# Patient Record
Sex: Male | Born: 1965
Health system: Southern US, Community
[De-identification: ages and names within clinical notes are randomized; demographics above are authoritative.]

## PROBLEM LIST (undated history)

## (undated) DIAGNOSIS — I471 Supraventricular tachycardia, unspecified: Secondary | ICD-10-CM

## (undated) DIAGNOSIS — E669 Obesity, unspecified: Secondary | ICD-10-CM

## (undated) DIAGNOSIS — I10 Essential (primary) hypertension: Secondary | ICD-10-CM

## (undated) DIAGNOSIS — E042 Nontoxic multinodular goiter: Secondary | ICD-10-CM

## (undated) DIAGNOSIS — E109 Type 1 diabetes mellitus without complications: Secondary | ICD-10-CM

## (undated) DIAGNOSIS — M722 Plantar fascial fibromatosis: Secondary | ICD-10-CM

## (undated) HISTORY — DX: Plantar fascial fibromatosis: M72.2

## (undated) HISTORY — DX: Type 1 diabetes mellitus without complications: E10.9

## (undated) HISTORY — DX: Supraventricular tachycardia: I47.1

## (undated) HISTORY — DX: Essential (primary) hypertension: I10

## (undated) HISTORY — DX: Supraventricular tachycardia, unspecified: I47.10

## (undated) HISTORY — DX: Obesity, unspecified: E66.9

## (undated) HISTORY — DX: Nontoxic multinodular goiter: E04.2

---

## 1998-05-01 ENCOUNTER — Encounter: Payer: Self-pay | Admitting: Family Medicine

## 1998-05-01 ENCOUNTER — Ambulatory Visit (HOSPITAL_COMMUNITY): Admission: RE | Admit: 1998-05-01 | Discharge: 1998-05-01 | Payer: Self-pay | Admitting: Family Medicine

## 1998-10-11 ENCOUNTER — Ambulatory Visit (HOSPITAL_COMMUNITY): Admission: RE | Admit: 1998-10-11 | Discharge: 1998-10-11 | Payer: Self-pay | Admitting: Family Medicine

## 1998-10-11 ENCOUNTER — Encounter: Payer: Self-pay | Admitting: Family Medicine

## 1998-10-24 ENCOUNTER — Encounter: Payer: Self-pay | Admitting: Family Medicine

## 1998-10-24 ENCOUNTER — Ambulatory Visit (HOSPITAL_COMMUNITY): Admission: RE | Admit: 1998-10-24 | Discharge: 1998-10-24 | Payer: Self-pay | Admitting: Family Medicine

## 1999-03-02 ENCOUNTER — Emergency Department (HOSPITAL_COMMUNITY): Admission: EM | Admit: 1999-03-02 | Discharge: 1999-03-02 | Payer: Self-pay | Admitting: Emergency Medicine

## 1999-09-11 ENCOUNTER — Other Ambulatory Visit: Admission: RE | Admit: 1999-09-11 | Discharge: 1999-09-11 | Payer: Self-pay | Admitting: Urology

## 2002-01-11 ENCOUNTER — Ambulatory Visit (HOSPITAL_COMMUNITY): Admission: RE | Admit: 2002-01-11 | Discharge: 2002-01-11 | Payer: Self-pay | Admitting: Family Medicine

## 2002-01-11 ENCOUNTER — Encounter: Payer: Self-pay | Admitting: Family Medicine

## 2002-08-30 ENCOUNTER — Encounter: Payer: Self-pay | Admitting: Family Medicine

## 2002-08-30 ENCOUNTER — Ambulatory Visit (HOSPITAL_COMMUNITY): Admission: RE | Admit: 2002-08-30 | Discharge: 2002-08-30 | Payer: Self-pay | Admitting: Family Medicine

## 2002-12-26 ENCOUNTER — Ambulatory Visit (HOSPITAL_COMMUNITY): Admission: RE | Admit: 2002-12-26 | Discharge: 2002-12-26 | Payer: Self-pay | Admitting: Family Medicine

## 2002-12-26 ENCOUNTER — Encounter: Payer: Self-pay | Admitting: Family Medicine

## 2010-04-02 ENCOUNTER — Encounter: Admission: RE | Admit: 2010-04-02 | Discharge: 2010-04-02 | Payer: Self-pay | Admitting: Internal Medicine

## 2011-12-04 IMAGING — US US SOFT TISSUE HEAD/NECK
1 series · 14 of 25 positions shown · non-contrast
Comparison: None.

CLINICAL DATA: Thyroid nodule on physical exam

THYROID ULTRASOUND
TECHNIQUE: Ultrasound examination of the thyroid gland and adjacent
soft tissues was performed.

[Series 1: us soft tissue head/neck · 0.10mm/px · 14 of 59 slices shown]
[im 1/59]
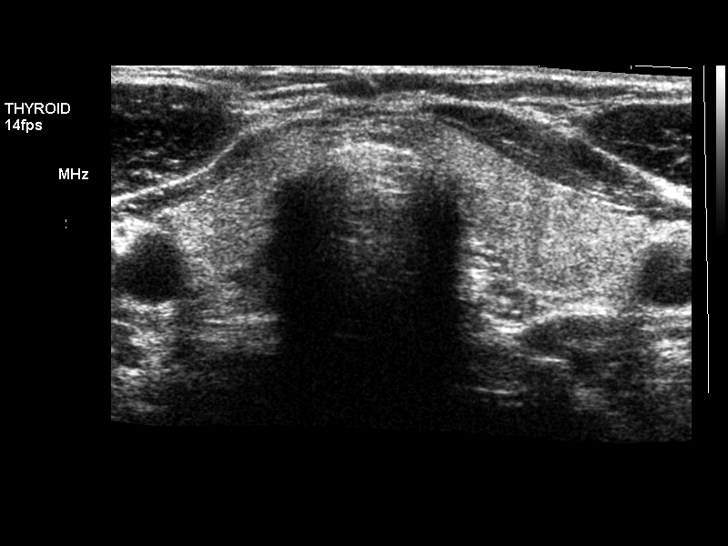
[im 5/59]
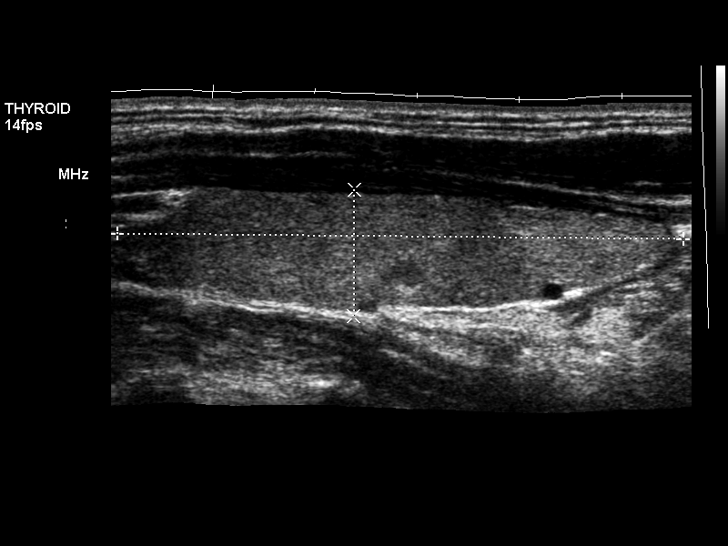
[im 10/59]
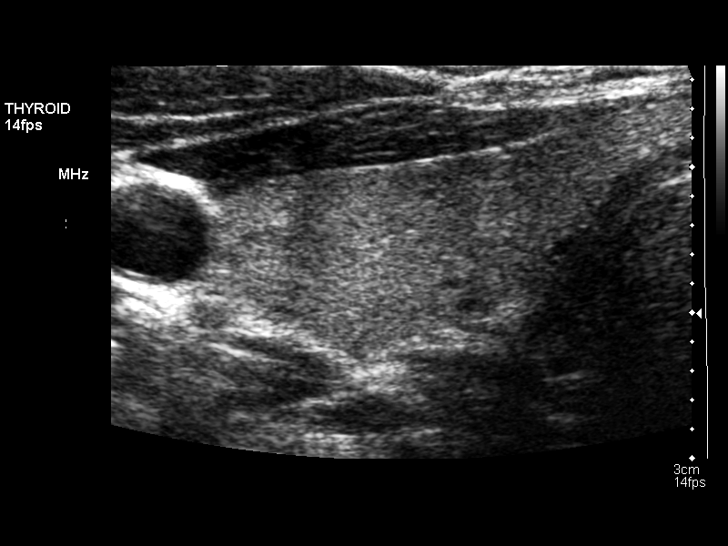
[im 15/59]
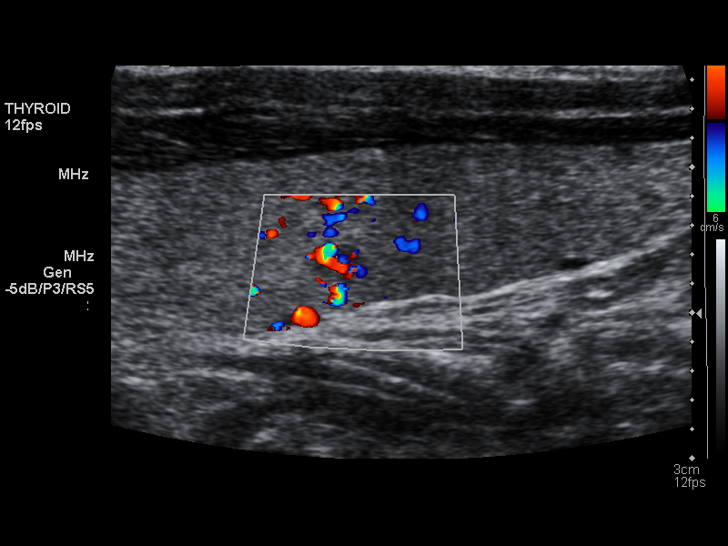
[im 20/59]
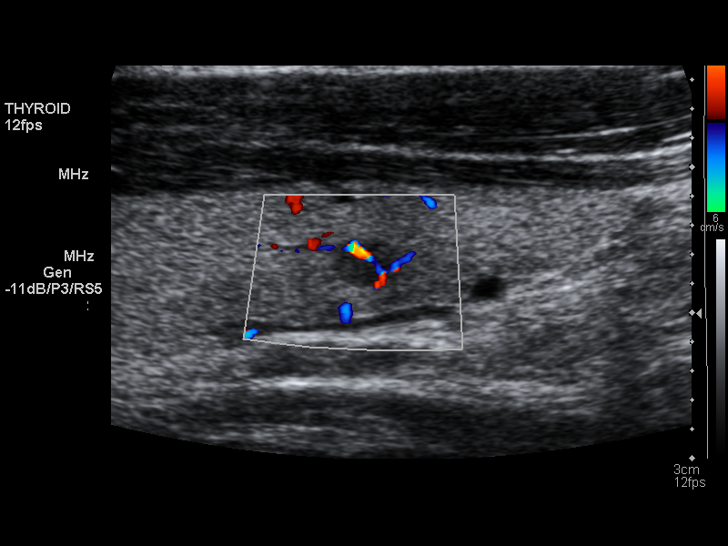
[im 22/59]
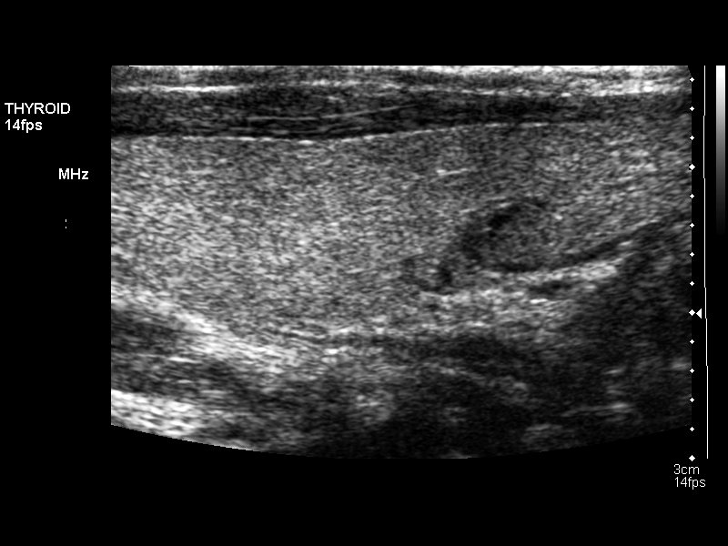
[im 27/59]
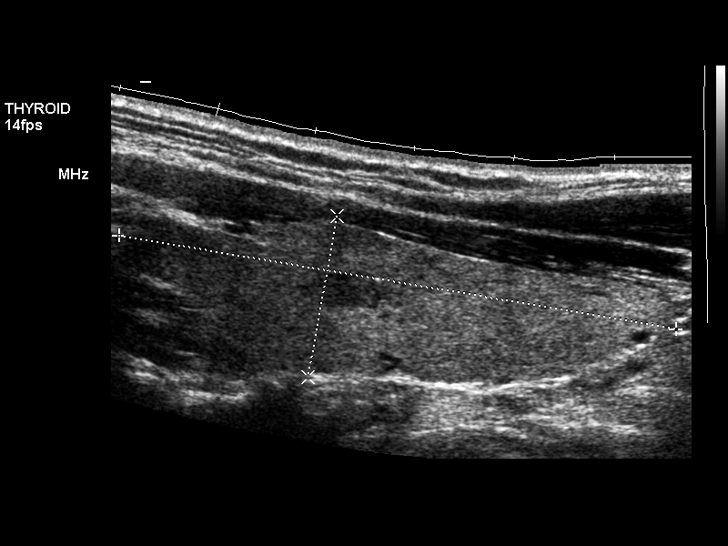
[im 32/59]
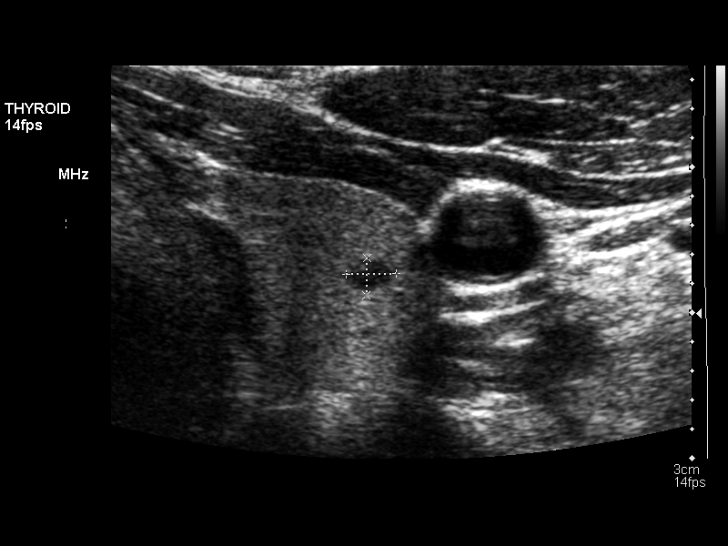
[im 37/59]
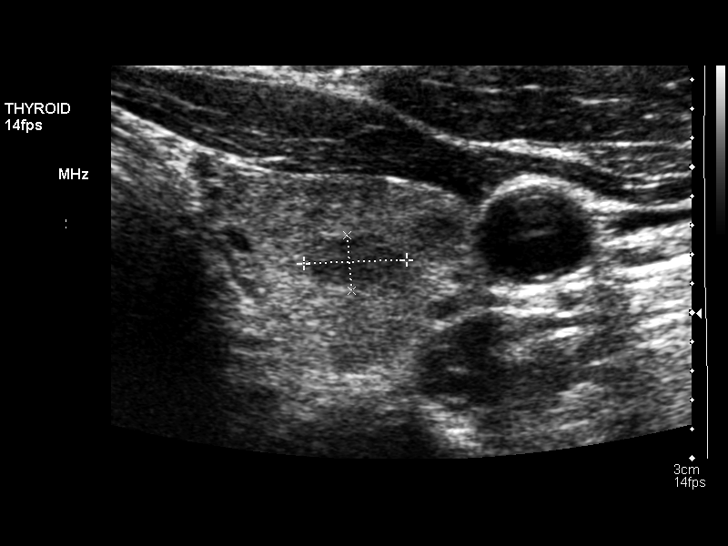
[im 39/59]
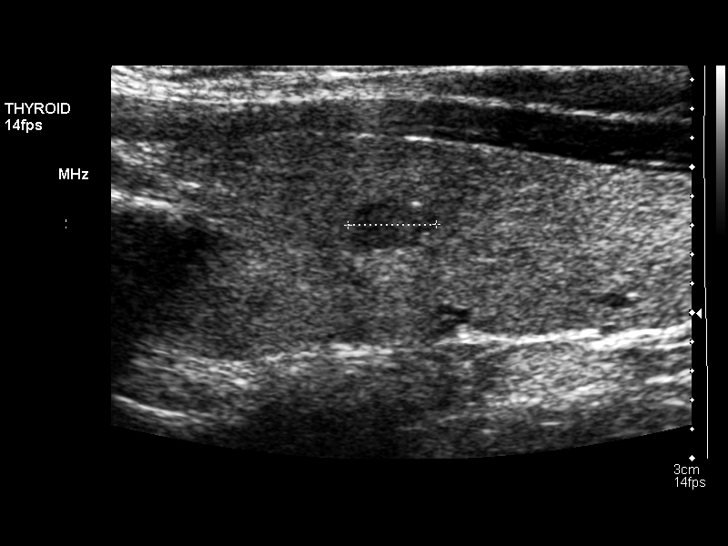
[im 44/59]
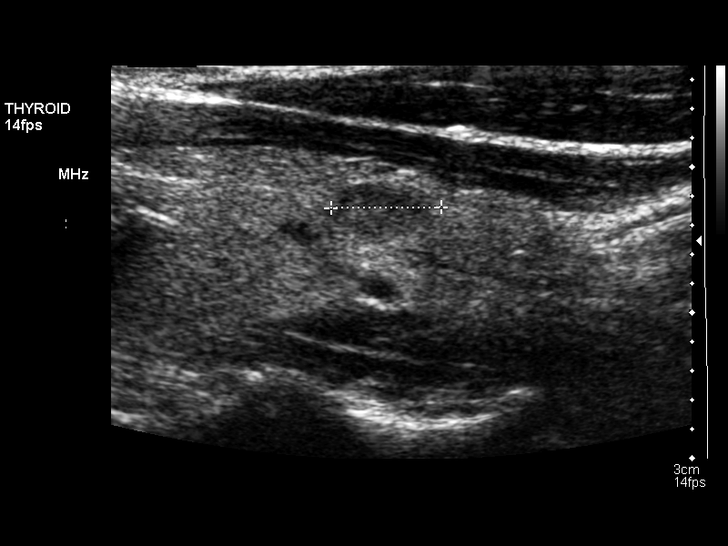
[im 49/59]
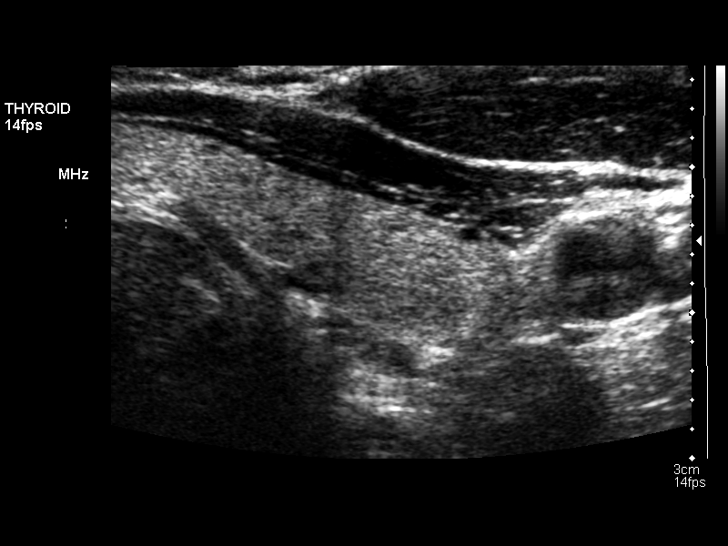
[im 54/59]
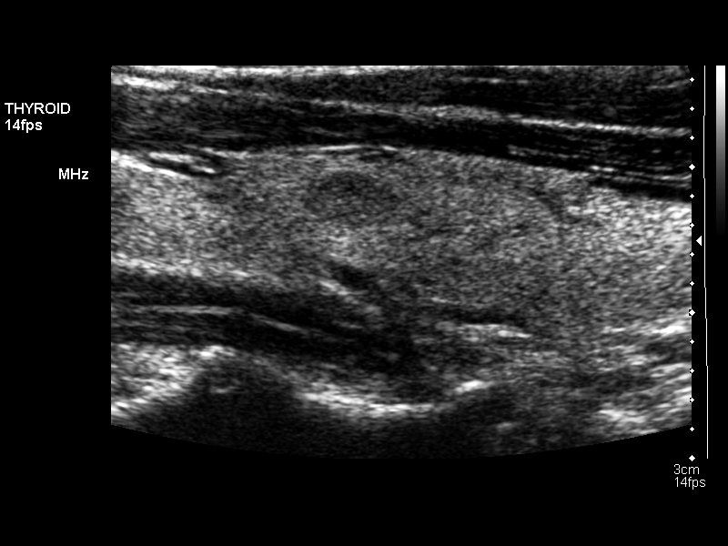
[im 59/59]
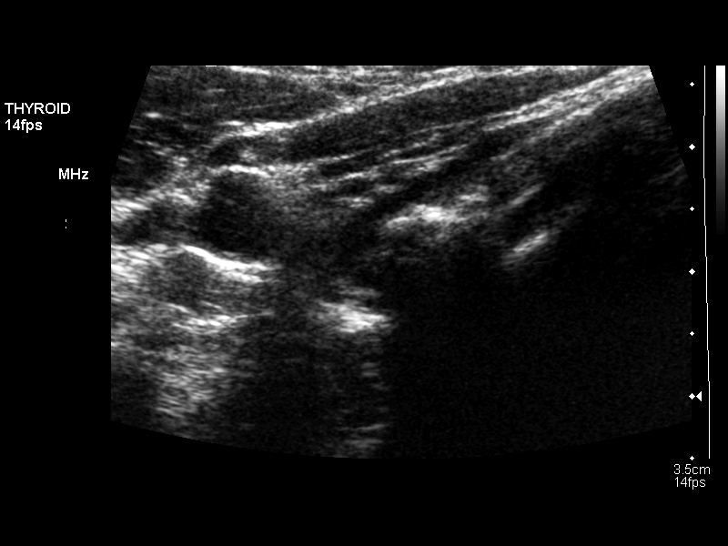

[14 of 25 positions shown; findings below may reference images not displayed]

FINDINGS: Right thyroid lobe:  5.5 x 1.2 x 2.2 cm.
Left thyroid lobe:  5.6 x 1.6 x 2.0 cm.
Isthmus:  2.7 mm.

Focal nodules:  The thyroid parenchyma is somewhat inhomogeneous.
Small nodules are noted bilaterally.  The largest solid nodule is
on the left measuring 7 x 4 x 6 mm.  Small hypoechoic nodules are
present bilaterally.

Lymphadenopathy:  Absent
IMPRESSION: The thyroid gland is within upper limits of normal in size and
somewhat inhomogeneous with small nodules of no more than 7 mm
noted bilaterally.

## 2015-03-08 ENCOUNTER — Other Ambulatory Visit: Payer: Self-pay | Admitting: Internal Medicine

## 2015-03-08 DIAGNOSIS — E049 Nontoxic goiter, unspecified: Secondary | ICD-10-CM

## 2015-03-15 ENCOUNTER — Ambulatory Visit
Admission: RE | Admit: 2015-03-15 | Discharge: 2015-03-15 | Disposition: A | Discharge status: Home / Self Care | Payer: Commercial Managed Care - HMO | Source: Ambulatory Visit | Attending: Internal Medicine | Admitting: Internal Medicine

## 2015-03-15 DIAGNOSIS — E049 Nontoxic goiter, unspecified: Secondary | ICD-10-CM

## 2016-09-23 DIAGNOSIS — M9901 Segmental and somatic dysfunction of cervical region: Secondary | ICD-10-CM | POA: Diagnosis not present

## 2016-09-23 DIAGNOSIS — M542 Cervicalgia: Secondary | ICD-10-CM | POA: Diagnosis not present

## 2016-09-23 DIAGNOSIS — M9902 Segmental and somatic dysfunction of thoracic region: Secondary | ICD-10-CM | POA: Diagnosis not present

## 2016-10-09 DIAGNOSIS — E782 Mixed hyperlipidemia: Secondary | ICD-10-CM | POA: Diagnosis not present

## 2016-10-09 DIAGNOSIS — Z0000 Encounter for general adult medical examination without abnormal findings: Secondary | ICD-10-CM | POA: Diagnosis not present

## 2016-10-09 DIAGNOSIS — Z125 Encounter for screening for malignant neoplasm of prostate: Secondary | ICD-10-CM | POA: Diagnosis not present

## 2016-10-09 DIAGNOSIS — I10 Essential (primary) hypertension: Secondary | ICD-10-CM | POA: Diagnosis not present

## 2016-10-09 DIAGNOSIS — R350 Frequency of micturition: Secondary | ICD-10-CM | POA: Diagnosis not present

## 2016-11-15 IMAGING — US US SOFT TISSUE HEAD/NECK
1 series · 13 of 25 positions shown · non-contrast
Comparison: Prior thyroid ultrasound 04/02/2010

CLINICAL DATA: 49-year-old male with thyroid goiter

EXAM:
THYROID ULTRASOUND
TECHNIQUE: Ultrasound examination of the thyroid gland and adjacent soft
tissues was performed.

[Series 1: us soft tissue head/neck · 0.06mm/px · 13 of 56 slices shown]
[im 1/56]
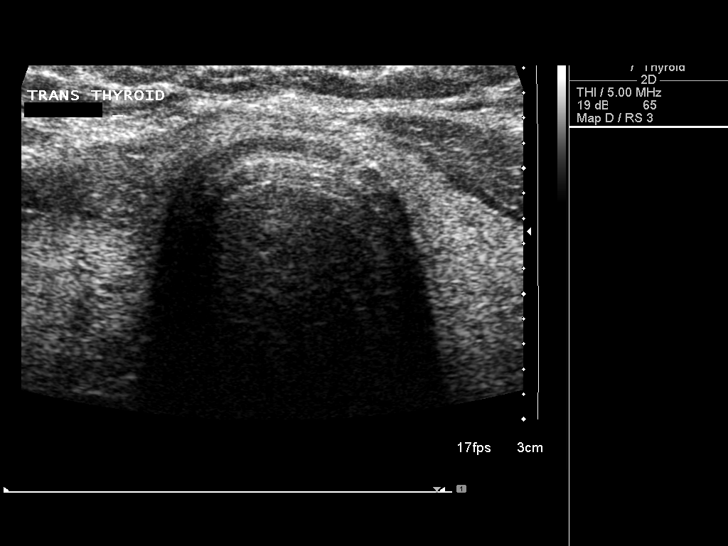
[im 5/56]
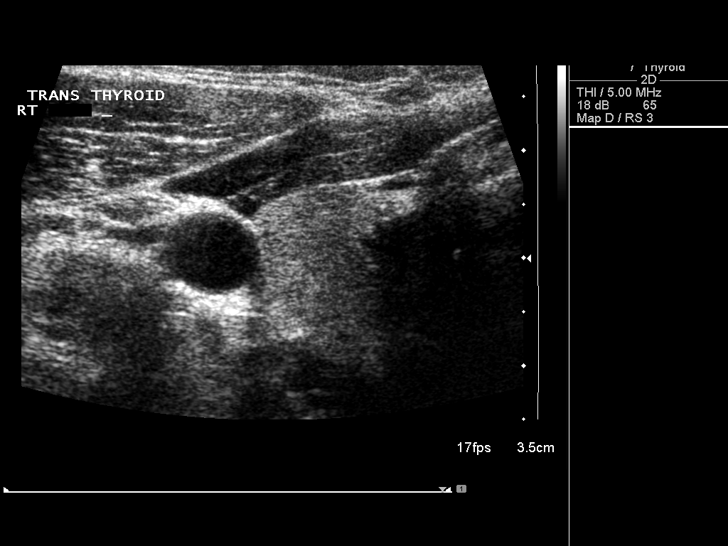
[im 10/56]
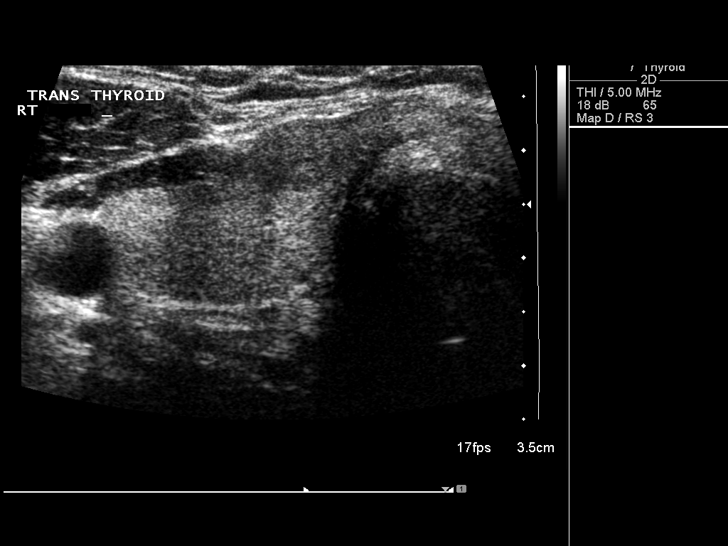
[im 14/56]
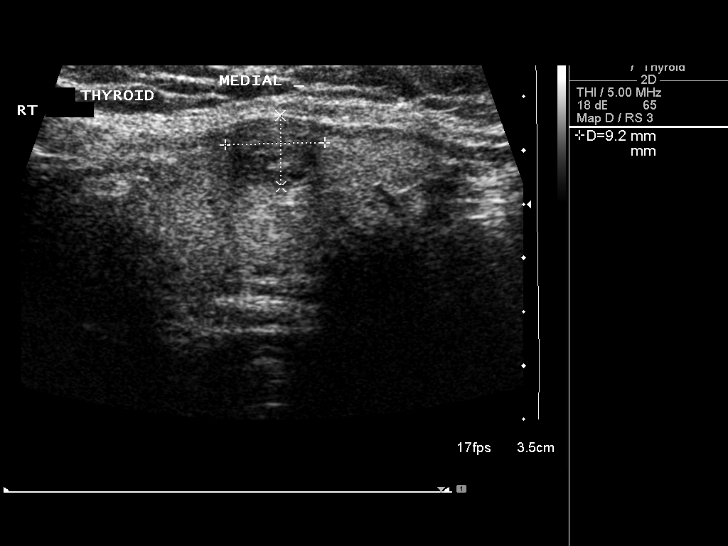
[im 19/56]
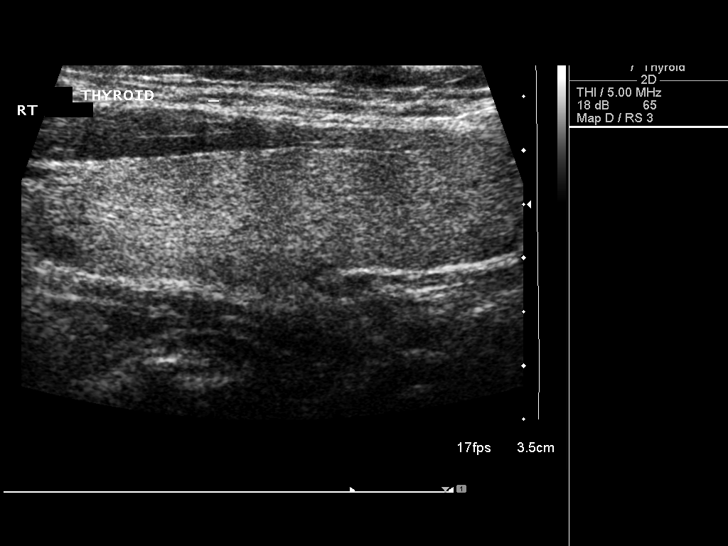
[im 23/56]
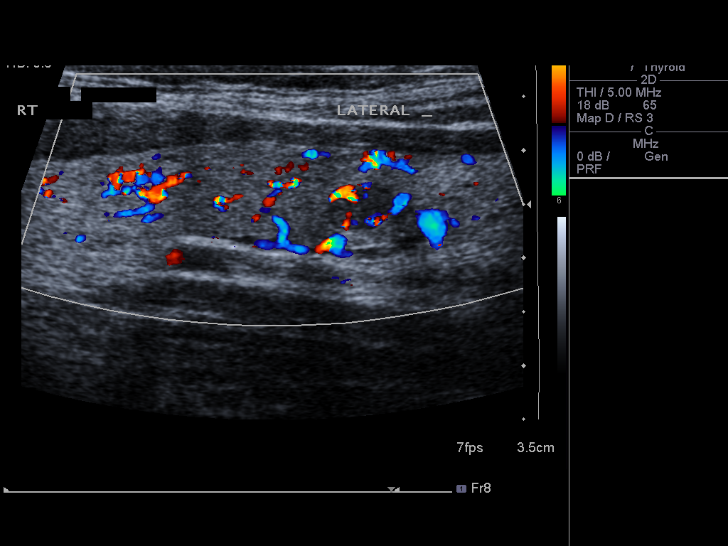
[im 28/56]
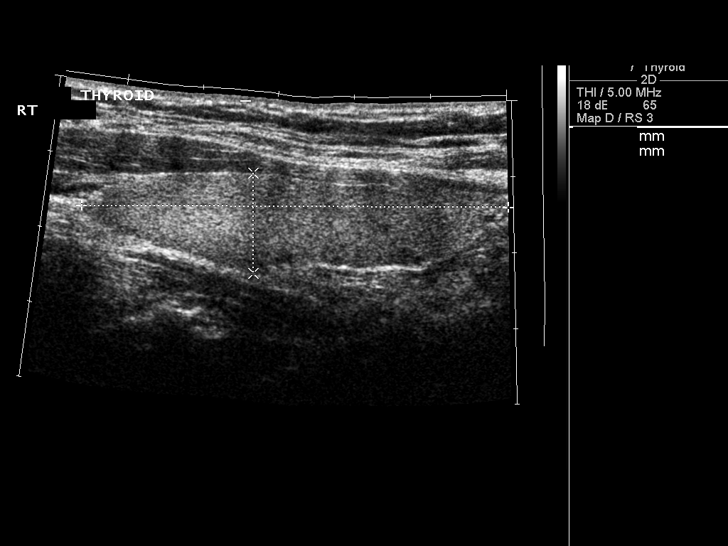
[im 33/56]
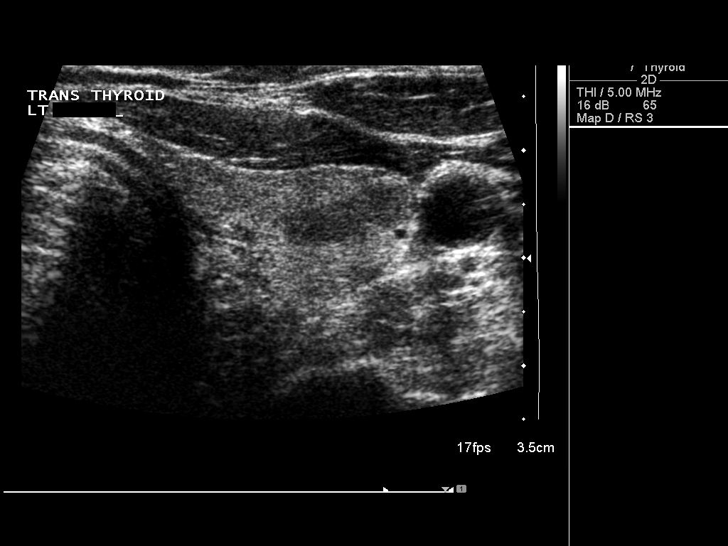
[im 37/56]
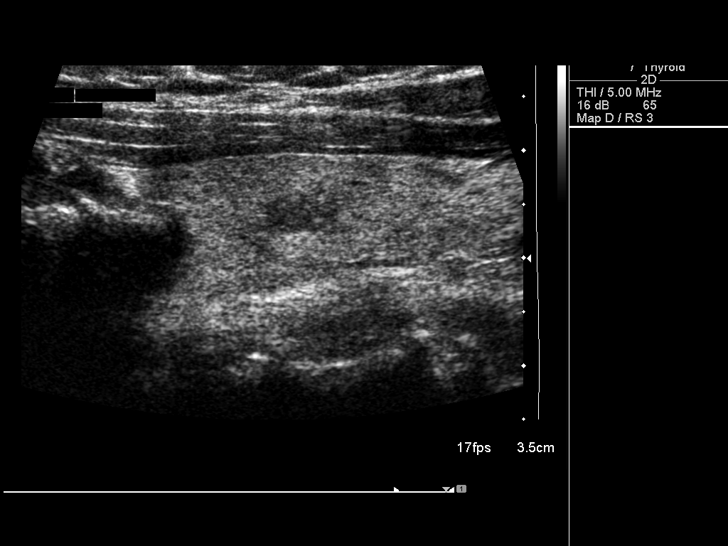
[im 42/56]
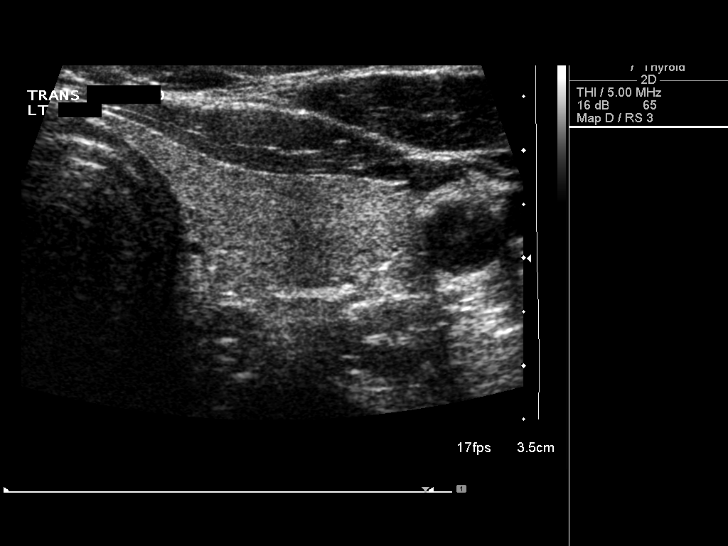
[im 46/56]
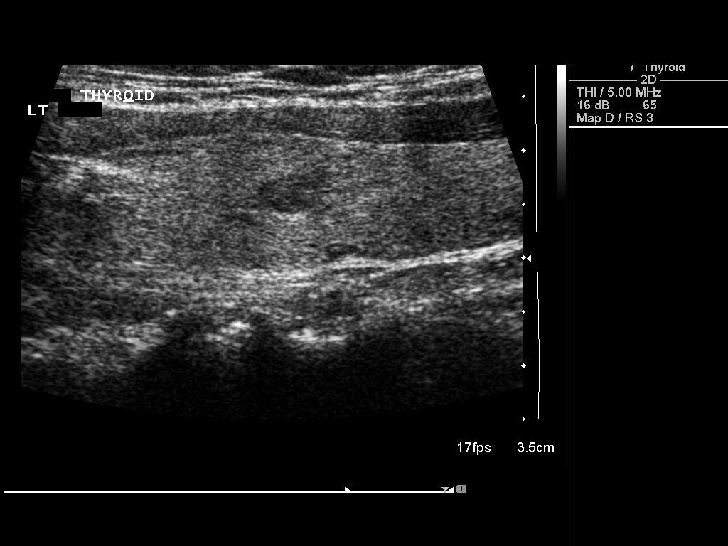
[im 51/56]
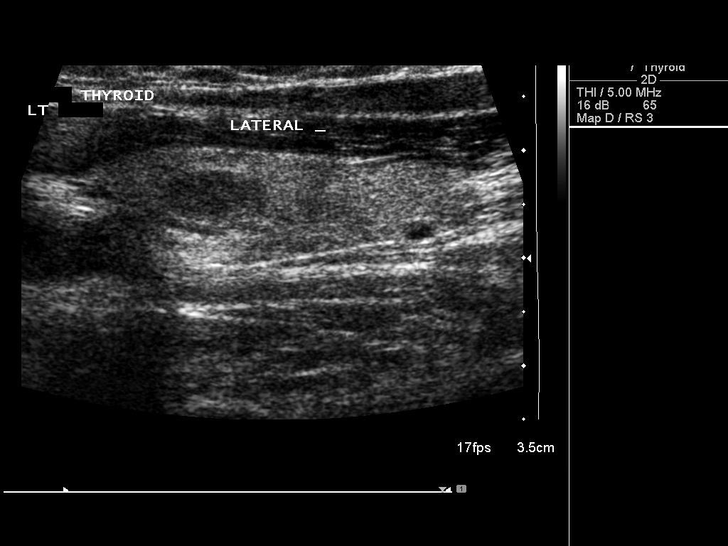
[im 56/56]
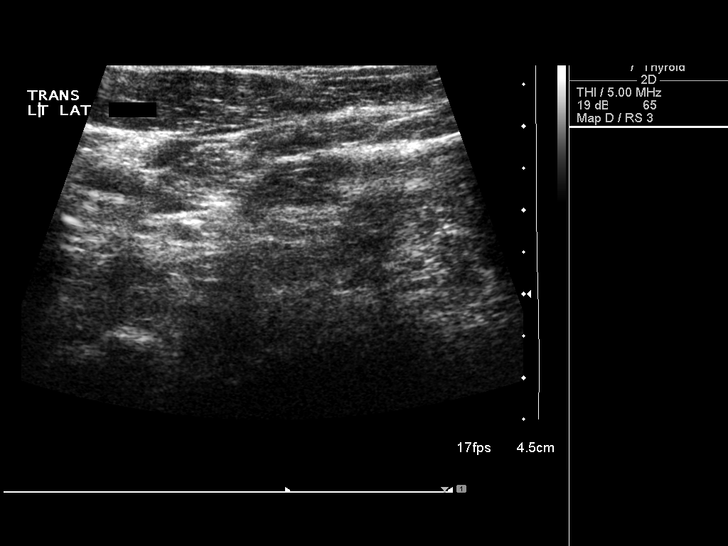

[13 of 25 positions shown; findings below may reference images not displayed]

FINDINGS: Right thyroid lobe

Measurements: 5.6 x 1.3 x 2.2 cm. Mild heterogeneity of the thyroid
parenchyma. Is 9 by 8 x 7 mm hypoechoic nodule in the medial aspect
of the mid to lower gland abutting the isthmus. There is a second
small 6 is mm hypoechoic nodule in the inferior pole.

Left thyroid lobe

Measurements: 5.9 x 1.3 x 2.1 cm. Mild heterogeneity of the thyroid
parenchyma. Solid hypoechoic nodules in the mid pole measure up to 7
mm.

Isthmus

Thickness: 0.3 cm.  No nodules visualized.

Lymphadenopathy

None visualized.
IMPRESSION: 1. Mildly heterogeneous thyroid parenchyma.
2. Small bilateral thyroid nodules measuring no more than 9 mm on
the right and 7 mm on the left. Findings do not meet current SRU
consensus criteria for biopsy. Follow-up by clinical exam is
recommended. If patient has known risk factors for thyroid
carcinoma, consider follow-up ultrasound in 12 months. If patient is
clinically hyperthyroid, consider nuclear medicine thyroid uptake
and scan.

Reference: Management of Thyroid Nodules Detected at US: Society of
Radiologists in Ultrasound Consensus Conference Statement. Radiology

## 2016-11-16 DIAGNOSIS — E1065 Type 1 diabetes mellitus with hyperglycemia: Secondary | ICD-10-CM | POA: Diagnosis not present

## 2016-11-18 DIAGNOSIS — E1065 Type 1 diabetes mellitus with hyperglycemia: Secondary | ICD-10-CM | POA: Diagnosis not present

## 2016-11-18 DIAGNOSIS — Z794 Long term (current) use of insulin: Secondary | ICD-10-CM | POA: Diagnosis not present

## 2016-11-18 DIAGNOSIS — E042 Nontoxic multinodular goiter: Secondary | ICD-10-CM | POA: Diagnosis not present

## 2016-12-16 DIAGNOSIS — M62830 Muscle spasm of back: Secondary | ICD-10-CM | POA: Diagnosis not present

## 2016-12-16 DIAGNOSIS — M9903 Segmental and somatic dysfunction of lumbar region: Secondary | ICD-10-CM | POA: Diagnosis not present

## 2016-12-16 DIAGNOSIS — M545 Low back pain: Secondary | ICD-10-CM | POA: Diagnosis not present

## 2016-12-29 DIAGNOSIS — E1065 Type 1 diabetes mellitus with hyperglycemia: Secondary | ICD-10-CM | POA: Diagnosis not present

## 2017-01-04 DIAGNOSIS — M545 Low back pain: Secondary | ICD-10-CM | POA: Diagnosis not present

## 2017-01-04 DIAGNOSIS — M62830 Muscle spasm of back: Secondary | ICD-10-CM | POA: Diagnosis not present

## 2017-01-04 DIAGNOSIS — M9903 Segmental and somatic dysfunction of lumbar region: Secondary | ICD-10-CM | POA: Diagnosis not present

## 2017-02-02 DIAGNOSIS — M9903 Segmental and somatic dysfunction of lumbar region: Secondary | ICD-10-CM | POA: Diagnosis not present

## 2017-02-02 DIAGNOSIS — M62830 Muscle spasm of back: Secondary | ICD-10-CM | POA: Diagnosis not present

## 2017-02-02 DIAGNOSIS — M545 Low back pain: Secondary | ICD-10-CM | POA: Diagnosis not present

## 2017-02-10 DIAGNOSIS — M545 Low back pain: Secondary | ICD-10-CM | POA: Diagnosis not present

## 2017-02-10 DIAGNOSIS — M9903 Segmental and somatic dysfunction of lumbar region: Secondary | ICD-10-CM | POA: Diagnosis not present

## 2017-02-10 DIAGNOSIS — M62830 Muscle spasm of back: Secondary | ICD-10-CM | POA: Diagnosis not present

## 2017-02-25 DIAGNOSIS — E1065 Type 1 diabetes mellitus with hyperglycemia: Secondary | ICD-10-CM | POA: Diagnosis not present

## 2017-02-25 DIAGNOSIS — E042 Nontoxic multinodular goiter: Secondary | ICD-10-CM | POA: Diagnosis not present

## 2017-02-25 DIAGNOSIS — Z794 Long term (current) use of insulin: Secondary | ICD-10-CM | POA: Diagnosis not present

## 2017-03-01 DIAGNOSIS — M9903 Segmental and somatic dysfunction of lumbar region: Secondary | ICD-10-CM | POA: Diagnosis not present

## 2017-03-01 DIAGNOSIS — M62830 Muscle spasm of back: Secondary | ICD-10-CM | POA: Diagnosis not present

## 2017-03-01 DIAGNOSIS — M545 Low back pain: Secondary | ICD-10-CM | POA: Diagnosis not present

## 2017-03-09 DIAGNOSIS — M545 Low back pain: Secondary | ICD-10-CM | POA: Diagnosis not present

## 2017-03-09 DIAGNOSIS — M62830 Muscle spasm of back: Secondary | ICD-10-CM | POA: Diagnosis not present

## 2017-03-09 DIAGNOSIS — M9903 Segmental and somatic dysfunction of lumbar region: Secondary | ICD-10-CM | POA: Diagnosis not present

## 2017-03-31 DIAGNOSIS — M9903 Segmental and somatic dysfunction of lumbar region: Secondary | ICD-10-CM | POA: Diagnosis not present

## 2017-03-31 DIAGNOSIS — M62830 Muscle spasm of back: Secondary | ICD-10-CM | POA: Diagnosis not present

## 2017-03-31 DIAGNOSIS — M545 Low back pain: Secondary | ICD-10-CM | POA: Diagnosis not present

## 2017-04-12 DIAGNOSIS — M722 Plantar fascial fibromatosis: Secondary | ICD-10-CM | POA: Diagnosis not present

## 2017-04-12 DIAGNOSIS — I10 Essential (primary) hypertension: Secondary | ICD-10-CM | POA: Diagnosis not present

## 2017-04-12 DIAGNOSIS — E782 Mixed hyperlipidemia: Secondary | ICD-10-CM | POA: Diagnosis not present

## 2017-04-12 DIAGNOSIS — Z23 Encounter for immunization: Secondary | ICD-10-CM | POA: Diagnosis not present

## 2017-05-03 DIAGNOSIS — E1065 Type 1 diabetes mellitus with hyperglycemia: Secondary | ICD-10-CM | POA: Diagnosis not present

## 2017-05-18 DIAGNOSIS — M4712 Other spondylosis with myelopathy, cervical region: Secondary | ICD-10-CM | POA: Diagnosis not present

## 2017-05-18 DIAGNOSIS — M542 Cervicalgia: Secondary | ICD-10-CM | POA: Diagnosis not present

## 2017-05-18 DIAGNOSIS — M9901 Segmental and somatic dysfunction of cervical region: Secondary | ICD-10-CM | POA: Diagnosis not present

## 2017-06-01 DIAGNOSIS — M542 Cervicalgia: Secondary | ICD-10-CM | POA: Diagnosis not present

## 2017-06-01 DIAGNOSIS — M9901 Segmental and somatic dysfunction of cervical region: Secondary | ICD-10-CM | POA: Diagnosis not present

## 2017-06-01 DIAGNOSIS — M4712 Other spondylosis with myelopathy, cervical region: Secondary | ICD-10-CM | POA: Diagnosis not present

## 2017-06-04 DIAGNOSIS — Z794 Long term (current) use of insulin: Secondary | ICD-10-CM | POA: Diagnosis not present

## 2017-06-04 DIAGNOSIS — E042 Nontoxic multinodular goiter: Secondary | ICD-10-CM | POA: Diagnosis not present

## 2017-06-04 DIAGNOSIS — E1065 Type 1 diabetes mellitus with hyperglycemia: Secondary | ICD-10-CM | POA: Diagnosis not present

## 2017-06-15 DIAGNOSIS — M542 Cervicalgia: Secondary | ICD-10-CM | POA: Diagnosis not present

## 2017-06-15 DIAGNOSIS — M4712 Other spondylosis with myelopathy, cervical region: Secondary | ICD-10-CM | POA: Diagnosis not present

## 2017-06-15 DIAGNOSIS — M9901 Segmental and somatic dysfunction of cervical region: Secondary | ICD-10-CM | POA: Diagnosis not present

## 2017-06-29 DIAGNOSIS — M722 Plantar fascial fibromatosis: Secondary | ICD-10-CM | POA: Diagnosis not present

## 2017-07-05 DIAGNOSIS — E1065 Type 1 diabetes mellitus with hyperglycemia: Secondary | ICD-10-CM | POA: Diagnosis not present

## 2017-07-20 DIAGNOSIS — E119 Type 2 diabetes mellitus without complications: Secondary | ICD-10-CM | POA: Diagnosis not present

## 2017-07-20 DIAGNOSIS — M722 Plantar fascial fibromatosis: Secondary | ICD-10-CM | POA: Diagnosis not present

## 2017-07-20 DIAGNOSIS — Z01812 Encounter for preprocedural laboratory examination: Secondary | ICD-10-CM | POA: Diagnosis not present

## 2017-07-20 DIAGNOSIS — Z79899 Other long term (current) drug therapy: Secondary | ICD-10-CM | POA: Diagnosis not present

## 2017-08-05 DIAGNOSIS — M722 Plantar fascial fibromatosis: Secondary | ICD-10-CM | POA: Diagnosis not present

## 2017-08-12 DIAGNOSIS — M722 Plantar fascial fibromatosis: Secondary | ICD-10-CM | POA: Diagnosis not present

## 2017-08-24 DIAGNOSIS — M722 Plantar fascial fibromatosis: Secondary | ICD-10-CM | POA: Diagnosis not present

## 2017-10-05 DIAGNOSIS — E1065 Type 1 diabetes mellitus with hyperglycemia: Secondary | ICD-10-CM | POA: Diagnosis not present

## 2017-11-12 DIAGNOSIS — E119 Type 2 diabetes mellitus without complications: Secondary | ICD-10-CM | POA: Diagnosis not present

## 2017-11-12 DIAGNOSIS — E1065 Type 1 diabetes mellitus with hyperglycemia: Secondary | ICD-10-CM | POA: Diagnosis not present

## 2017-11-12 DIAGNOSIS — E042 Nontoxic multinodular goiter: Secondary | ICD-10-CM | POA: Diagnosis not present

## 2017-11-12 DIAGNOSIS — Z794 Long term (current) use of insulin: Secondary | ICD-10-CM | POA: Diagnosis not present

## 2017-11-15 DIAGNOSIS — I10 Essential (primary) hypertension: Secondary | ICD-10-CM | POA: Diagnosis not present

## 2017-11-15 DIAGNOSIS — Z125 Encounter for screening for malignant neoplasm of prostate: Secondary | ICD-10-CM | POA: Diagnosis not present

## 2017-11-15 DIAGNOSIS — E782 Mixed hyperlipidemia: Secondary | ICD-10-CM | POA: Diagnosis not present

## 2017-11-15 DIAGNOSIS — G4700 Insomnia, unspecified: Secondary | ICD-10-CM | POA: Diagnosis not present

## 2017-11-15 DIAGNOSIS — Z0000 Encounter for general adult medical examination without abnormal findings: Secondary | ICD-10-CM | POA: Diagnosis not present

## 2017-11-25 DIAGNOSIS — R0981 Nasal congestion: Secondary | ICD-10-CM | POA: Diagnosis not present

## 2017-12-06 DIAGNOSIS — M4712 Other spondylosis with myelopathy, cervical region: Secondary | ICD-10-CM | POA: Diagnosis not present

## 2017-12-06 DIAGNOSIS — M542 Cervicalgia: Secondary | ICD-10-CM | POA: Diagnosis not present

## 2017-12-06 DIAGNOSIS — M9901 Segmental and somatic dysfunction of cervical region: Secondary | ICD-10-CM | POA: Diagnosis not present

## 2017-12-28 DIAGNOSIS — M4712 Other spondylosis with myelopathy, cervical region: Secondary | ICD-10-CM | POA: Diagnosis not present

## 2017-12-28 DIAGNOSIS — M542 Cervicalgia: Secondary | ICD-10-CM | POA: Diagnosis not present

## 2017-12-28 DIAGNOSIS — M9901 Segmental and somatic dysfunction of cervical region: Secondary | ICD-10-CM | POA: Diagnosis not present

## 2017-12-30 DIAGNOSIS — M9901 Segmental and somatic dysfunction of cervical region: Secondary | ICD-10-CM | POA: Diagnosis not present

## 2017-12-30 DIAGNOSIS — M4712 Other spondylosis with myelopathy, cervical region: Secondary | ICD-10-CM | POA: Diagnosis not present

## 2017-12-30 DIAGNOSIS — M542 Cervicalgia: Secondary | ICD-10-CM | POA: Diagnosis not present

## 2018-01-03 DIAGNOSIS — M9901 Segmental and somatic dysfunction of cervical region: Secondary | ICD-10-CM | POA: Diagnosis not present

## 2018-01-03 DIAGNOSIS — M542 Cervicalgia: Secondary | ICD-10-CM | POA: Diagnosis not present

## 2018-01-03 DIAGNOSIS — M4712 Other spondylosis with myelopathy, cervical region: Secondary | ICD-10-CM | POA: Diagnosis not present

## 2018-01-03 DIAGNOSIS — E1065 Type 1 diabetes mellitus with hyperglycemia: Secondary | ICD-10-CM | POA: Diagnosis not present

## 2018-01-06 DIAGNOSIS — M542 Cervicalgia: Secondary | ICD-10-CM | POA: Diagnosis not present

## 2018-01-06 DIAGNOSIS — M9901 Segmental and somatic dysfunction of cervical region: Secondary | ICD-10-CM | POA: Diagnosis not present

## 2018-01-06 DIAGNOSIS — M4712 Other spondylosis with myelopathy, cervical region: Secondary | ICD-10-CM | POA: Diagnosis not present

## 2018-01-11 DIAGNOSIS — M542 Cervicalgia: Secondary | ICD-10-CM | POA: Diagnosis not present

## 2018-01-11 DIAGNOSIS — M9901 Segmental and somatic dysfunction of cervical region: Secondary | ICD-10-CM | POA: Diagnosis not present

## 2018-01-11 DIAGNOSIS — M4712 Other spondylosis with myelopathy, cervical region: Secondary | ICD-10-CM | POA: Diagnosis not present

## 2018-01-13 DIAGNOSIS — M4712 Other spondylosis with myelopathy, cervical region: Secondary | ICD-10-CM | POA: Diagnosis not present

## 2018-01-13 DIAGNOSIS — M9901 Segmental and somatic dysfunction of cervical region: Secondary | ICD-10-CM | POA: Diagnosis not present

## 2018-01-13 DIAGNOSIS — M542 Cervicalgia: Secondary | ICD-10-CM | POA: Diagnosis not present

## 2018-03-08 DIAGNOSIS — M542 Cervicalgia: Secondary | ICD-10-CM | POA: Diagnosis not present

## 2018-03-08 DIAGNOSIS — M9901 Segmental and somatic dysfunction of cervical region: Secondary | ICD-10-CM | POA: Diagnosis not present

## 2018-03-08 DIAGNOSIS — M4712 Other spondylosis with myelopathy, cervical region: Secondary | ICD-10-CM | POA: Diagnosis not present

## 2018-03-21 DIAGNOSIS — M542 Cervicalgia: Secondary | ICD-10-CM | POA: Diagnosis not present

## 2018-03-21 DIAGNOSIS — M9901 Segmental and somatic dysfunction of cervical region: Secondary | ICD-10-CM | POA: Diagnosis not present

## 2018-03-21 DIAGNOSIS — M4712 Other spondylosis with myelopathy, cervical region: Secondary | ICD-10-CM | POA: Diagnosis not present

## 2018-04-05 DIAGNOSIS — M9901 Segmental and somatic dysfunction of cervical region: Secondary | ICD-10-CM | POA: Diagnosis not present

## 2018-04-05 DIAGNOSIS — M542 Cervicalgia: Secondary | ICD-10-CM | POA: Diagnosis not present

## 2018-04-05 DIAGNOSIS — E1065 Type 1 diabetes mellitus with hyperglycemia: Secondary | ICD-10-CM | POA: Diagnosis not present

## 2018-04-05 DIAGNOSIS — M4712 Other spondylosis with myelopathy, cervical region: Secondary | ICD-10-CM | POA: Diagnosis not present

## 2018-04-21 DIAGNOSIS — M9901 Segmental and somatic dysfunction of cervical region: Secondary | ICD-10-CM | POA: Diagnosis not present

## 2018-04-21 DIAGNOSIS — M4712 Other spondylosis with myelopathy, cervical region: Secondary | ICD-10-CM | POA: Diagnosis not present

## 2018-04-21 DIAGNOSIS — M542 Cervicalgia: Secondary | ICD-10-CM | POA: Diagnosis not present

## 2018-04-26 DIAGNOSIS — R109 Unspecified abdominal pain: Secondary | ICD-10-CM | POA: Diagnosis not present

## 2018-04-26 DIAGNOSIS — E042 Nontoxic multinodular goiter: Secondary | ICD-10-CM | POA: Diagnosis not present

## 2018-04-26 DIAGNOSIS — Z23 Encounter for immunization: Secondary | ICD-10-CM | POA: Diagnosis not present

## 2018-04-26 DIAGNOSIS — Z794 Long term (current) use of insulin: Secondary | ICD-10-CM | POA: Diagnosis not present

## 2018-04-26 DIAGNOSIS — E1065 Type 1 diabetes mellitus with hyperglycemia: Secondary | ICD-10-CM | POA: Diagnosis not present

## 2018-04-28 ENCOUNTER — Ambulatory Visit
Admission: RE | Admit: 2018-04-28 | Discharge: 2018-04-28 | Disposition: A | Discharge status: Home / Self Care | Payer: 59 | Source: Ambulatory Visit | Attending: Family Medicine | Admitting: Family Medicine

## 2018-04-28 ENCOUNTER — Other Ambulatory Visit: Payer: Self-pay | Admitting: Family Medicine

## 2018-04-28 DIAGNOSIS — R109 Unspecified abdominal pain: Secondary | ICD-10-CM

## 2018-04-28 DIAGNOSIS — R0789 Other chest pain: Secondary | ICD-10-CM | POA: Diagnosis not present

## 2018-06-02 DIAGNOSIS — M4712 Other spondylosis with myelopathy, cervical region: Secondary | ICD-10-CM | POA: Diagnosis not present

## 2018-06-02 DIAGNOSIS — M542 Cervicalgia: Secondary | ICD-10-CM | POA: Diagnosis not present

## 2018-06-02 DIAGNOSIS — M9901 Segmental and somatic dysfunction of cervical region: Secondary | ICD-10-CM | POA: Diagnosis not present

## 2018-06-14 DIAGNOSIS — M542 Cervicalgia: Secondary | ICD-10-CM | POA: Diagnosis not present

## 2018-06-14 DIAGNOSIS — M9901 Segmental and somatic dysfunction of cervical region: Secondary | ICD-10-CM | POA: Diagnosis not present

## 2018-06-14 DIAGNOSIS — M4712 Other spondylosis with myelopathy, cervical region: Secondary | ICD-10-CM | POA: Diagnosis not present

## 2018-06-16 DIAGNOSIS — J309 Allergic rhinitis, unspecified: Secondary | ICD-10-CM | POA: Diagnosis not present

## 2018-06-16 DIAGNOSIS — E782 Mixed hyperlipidemia: Secondary | ICD-10-CM | POA: Diagnosis not present

## 2018-06-16 DIAGNOSIS — I10 Essential (primary) hypertension: Secondary | ICD-10-CM | POA: Diagnosis not present

## 2018-07-06 DIAGNOSIS — E1065 Type 1 diabetes mellitus with hyperglycemia: Secondary | ICD-10-CM | POA: Diagnosis not present

## 2018-07-12 DIAGNOSIS — M4712 Other spondylosis with myelopathy, cervical region: Secondary | ICD-10-CM | POA: Diagnosis not present

## 2018-07-12 DIAGNOSIS — M542 Cervicalgia: Secondary | ICD-10-CM | POA: Diagnosis not present

## 2018-07-12 DIAGNOSIS — M9901 Segmental and somatic dysfunction of cervical region: Secondary | ICD-10-CM | POA: Diagnosis not present

## 2018-08-02 DIAGNOSIS — M9901 Segmental and somatic dysfunction of cervical region: Secondary | ICD-10-CM | POA: Diagnosis not present

## 2018-08-02 DIAGNOSIS — M4712 Other spondylosis with myelopathy, cervical region: Secondary | ICD-10-CM | POA: Diagnosis not present

## 2018-08-02 DIAGNOSIS — M542 Cervicalgia: Secondary | ICD-10-CM | POA: Diagnosis not present

## 2018-08-08 DIAGNOSIS — Z794 Long term (current) use of insulin: Secondary | ICD-10-CM | POA: Diagnosis not present

## 2018-08-08 DIAGNOSIS — E042 Nontoxic multinodular goiter: Secondary | ICD-10-CM | POA: Diagnosis not present

## 2018-08-08 DIAGNOSIS — E1065 Type 1 diabetes mellitus with hyperglycemia: Secondary | ICD-10-CM | POA: Diagnosis not present

## 2018-08-30 DIAGNOSIS — M9901 Segmental and somatic dysfunction of cervical region: Secondary | ICD-10-CM | POA: Diagnosis not present

## 2018-08-30 DIAGNOSIS — M542 Cervicalgia: Secondary | ICD-10-CM | POA: Diagnosis not present

## 2018-08-30 DIAGNOSIS — M4712 Other spondylosis with myelopathy, cervical region: Secondary | ICD-10-CM | POA: Diagnosis not present

## 2018-10-06 DIAGNOSIS — E1065 Type 1 diabetes mellitus with hyperglycemia: Secondary | ICD-10-CM | POA: Diagnosis not present

## 2018-11-01 DIAGNOSIS — M5136 Other intervertebral disc degeneration, lumbar region: Secondary | ICD-10-CM | POA: Diagnosis not present

## 2018-11-01 DIAGNOSIS — M545 Low back pain: Secondary | ICD-10-CM | POA: Diagnosis not present

## 2018-11-01 DIAGNOSIS — M9903 Segmental and somatic dysfunction of lumbar region: Secondary | ICD-10-CM | POA: Diagnosis not present

## 2018-11-08 DIAGNOSIS — M545 Low back pain: Secondary | ICD-10-CM | POA: Diagnosis not present

## 2018-11-08 DIAGNOSIS — M5136 Other intervertebral disc degeneration, lumbar region: Secondary | ICD-10-CM | POA: Diagnosis not present

## 2018-11-08 DIAGNOSIS — M9903 Segmental and somatic dysfunction of lumbar region: Secondary | ICD-10-CM | POA: Diagnosis not present

## 2018-11-22 DIAGNOSIS — M5136 Other intervertebral disc degeneration, lumbar region: Secondary | ICD-10-CM | POA: Diagnosis not present

## 2018-11-22 DIAGNOSIS — M545 Low back pain: Secondary | ICD-10-CM | POA: Diagnosis not present

## 2018-11-22 DIAGNOSIS — M9903 Segmental and somatic dysfunction of lumbar region: Secondary | ICD-10-CM | POA: Diagnosis not present

## 2018-12-08 DIAGNOSIS — M9903 Segmental and somatic dysfunction of lumbar region: Secondary | ICD-10-CM | POA: Diagnosis not present

## 2018-12-08 DIAGNOSIS — M545 Low back pain: Secondary | ICD-10-CM | POA: Diagnosis not present

## 2018-12-08 DIAGNOSIS — M5136 Other intervertebral disc degeneration, lumbar region: Secondary | ICD-10-CM | POA: Diagnosis not present

## 2018-12-22 DIAGNOSIS — M545 Low back pain: Secondary | ICD-10-CM | POA: Diagnosis not present

## 2018-12-22 DIAGNOSIS — M5136 Other intervertebral disc degeneration, lumbar region: Secondary | ICD-10-CM | POA: Diagnosis not present

## 2018-12-22 DIAGNOSIS — M9903 Segmental and somatic dysfunction of lumbar region: Secondary | ICD-10-CM | POA: Diagnosis not present

## 2019-01-04 DIAGNOSIS — E1065 Type 1 diabetes mellitus with hyperglycemia: Secondary | ICD-10-CM | POA: Diagnosis not present

## 2019-12-30 IMAGING — CR DG CHEST 2V
2 series · 2 of 2 positions shown · non-contrast
Comparison: None.

CLINICAL DATA: Acute left-sided chest wall pain.

EXAM:
CHEST - 2 VIEW

[w chest pa]
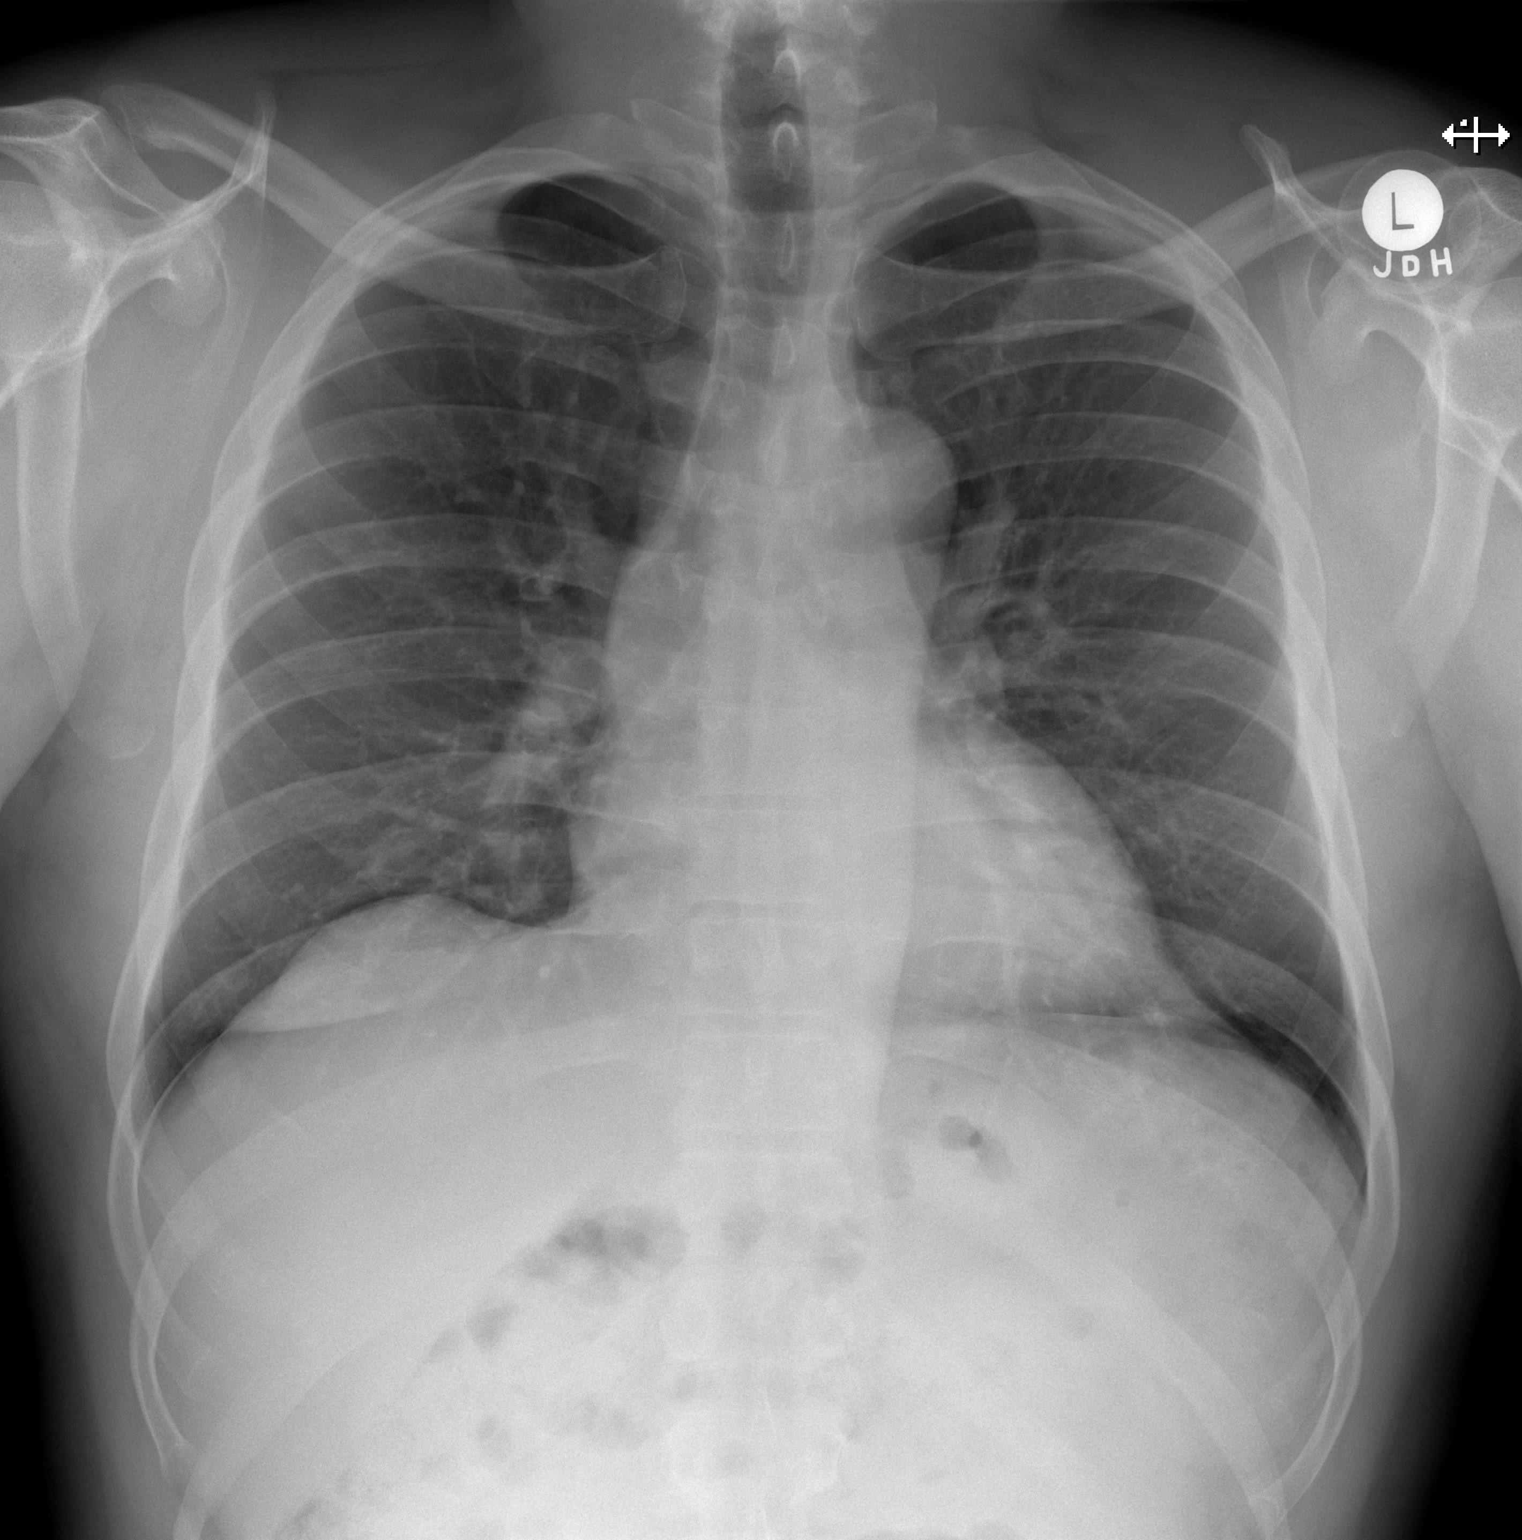

[w chest lat]
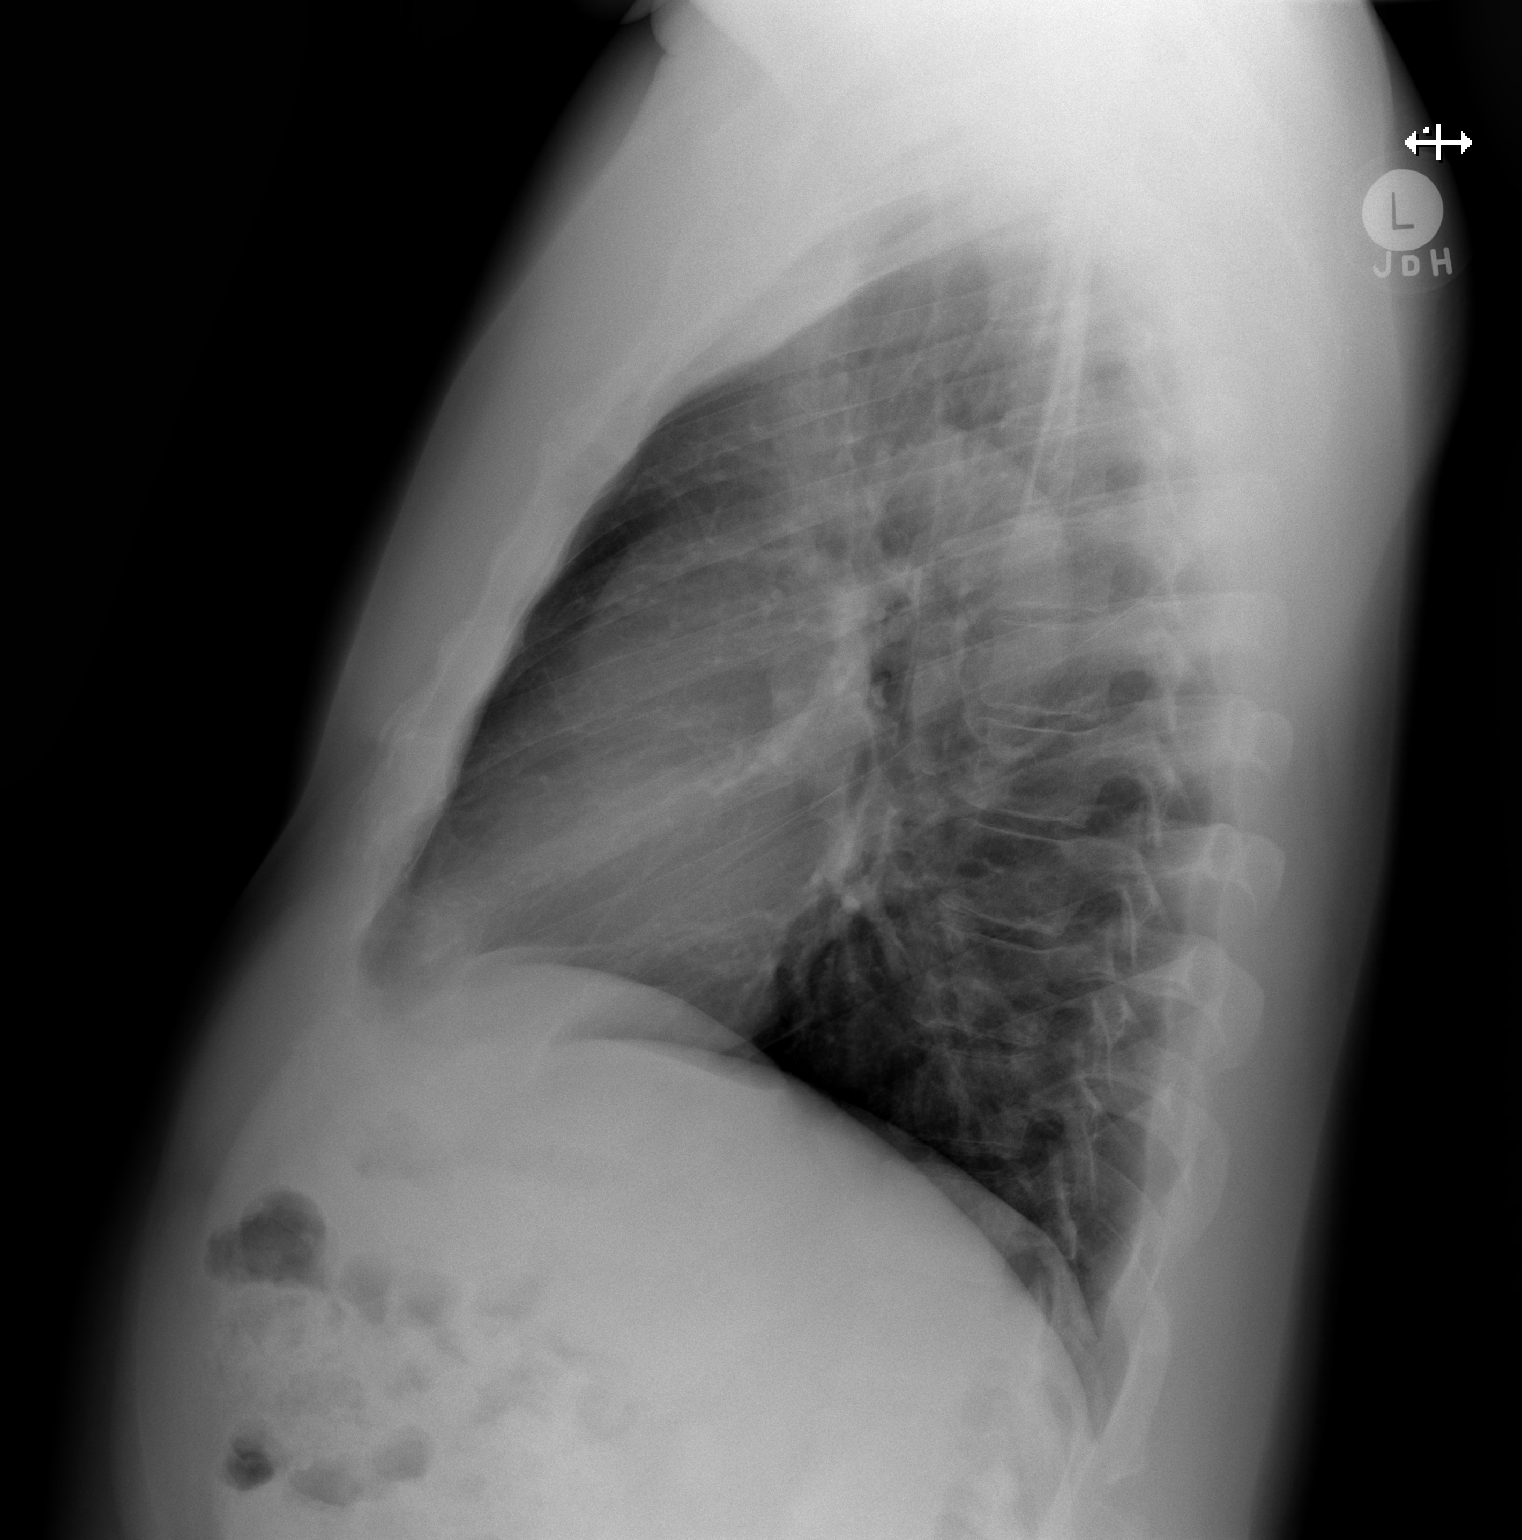

[2 of 2 positions shown; findings below may reference images not displayed]

FINDINGS: The heart size and mediastinal contours are within normal limits.
Both lungs are clear. No pneumothorax or pleural effusion is noted.
The visualized skeletal structures are unremarkable.
IMPRESSION: No active cardiopulmonary disease.

## 2020-01-09 NOTE — Progress Notes (Signed)
Cardiology Consult Note    Date:  01/10/2020   ID:  Jorge Calhoun, DOB March 18, 1966, MRN [id number]  PCP:  Tally Joe, MD  Cardiologist:  Armanda Magic, MD   Chief Complaint  Patient presents with  . New Patient (Initial Visit)    Palpitations    History of Present Illness:  Jorge Calhoun is a 54 y.o. male who is being seen today for the evaluation of palpitations at the request of Tally Joe, MD.  This is a 54yo AAM with a hx of nontoxic multinodular goiter, HTN, Obesity and Type 1 DM who recently saw his PCP and complained of palpitations.  He is now here for evaluation.    He tells me that he has palpitations on a daily basis for the past 3 months.  The last a few seconds and then resolve and fell like a skipped beat that makes him feel like he has a bubble in his chest.  He denies any associated sx with them.  He drinks 1 caffienated soda a day and a detox tea but says that the palpitations started before he started with the tea.  He denies any OTC medications.  He denies any chest pain or pressure, SOB, DOE, PND, orthopnea, LE edema, dizziness, palpitations or syncope. He is compliant with his meds and is tolerating meds with no SE.  He denies any tobacco use.    Past Medical History:  Diagnosis Date  . Hypertension   . Nontoxic multinodular goiter   . Obesity, unspecified   . Plantar fasciitis   . Type I (juvenile type) diabetes mellitus without mention of complication, not stated as uncontrolled     No past surgical history on file.  Current Medications: Current Meds  Medication Sig  . amLODipine (NORVASC) 5 MG tablet Take 5 mg by mouth daily.  Marland Kitchen aspirin 81 MG tablet Take 81 mg by mouth daily.  . insulin lispro (HUMALOG) 100 UNIT/ML injection Inject 30 Units into the skin 3 (three) times daily before meals.  . Multiple Vitamin (MULTIVITAMIN) capsule Take 1 capsule by mouth daily.  Marland Kitchen olmesartan-hydrochlorothiazide (BENICAR HCT) 40-25 MG per tablet Take 1  tablet by mouth daily.  . [DISCONTINUED] insulin glargine (LANTUS) 100 UNIT/ML injection Inject 30 Units into the skin every morning.  . [DISCONTINUED] simvastatin (ZOCOR) 10 MG tablet Take 10 mg by mouth daily.    Allergies:   Metformin and related   Social History   Socioeconomic History  . Marital status: Married    Spouse name: Not on file  . Number of children: Not on file  . Years of education: Not on file  . Highest education level: Not on file  Occupational History  . Not on file  Tobacco Use  . Smoking status: Never Smoker  . Smokeless tobacco: Never Used  Substance and Sexual Activity  . Alcohol use: Not on file  . Drug use: Not on file  . Sexual activity: Not on file  Other Topics Concern  . Not on file  Social History Narrative  . Not on file   Social Determinants of Health   Financial Resource Strain:   . Difficulty of Paying Living Expenses:   Food Insecurity:   . Worried About Programme researcher, broadcasting/film/video in the Last Year:   . Barista in the Last Year:   Transportation Needs:   . Freight forwarder (Medical):   Marland Kitchen Lack of Transportation (Non-Medical):   Physical Activity:   .  Days of Exercise per Week:   . Minutes of Exercise per Session:   Stress:   . Feeling of Stress :   Social Connections:   . Frequency of Communication with Friends and Family:   . Frequency of Social Gatherings with Friends and Family:   . Attends Religious Services:   . Active Member of Clubs or Organizations:   . Attends Archivist Meetings:   Marland Kitchen Marital Status:      Family History:  The patient's family history includes Cancer - Ovarian in his maternal grandmother; Diabetes in his father and paternal grandmother; Hypertension in his father and mother.   ROS:   Please see the history of present illness.    ROS All other systems reviewed and are negative.  No flowsheet data found.   PHYSICAL EXAM:   VS:  BP (!) 138/94   Pulse 82   Ht 5\' 11"  (1.803 m)    Wt 248 lb (112.5 kg)   SpO2 97%   BMI 34.59 kg/m    GEN: Well nourished, well developed, in no acute distress  HEENT: normal  Neck: no JVD, carotid bruits, or masses Cardiac: RRR; no murmurs, rubs, or gallops,no edema.  Intact distal pulses bilaterally.  Respiratory:  clear to auscultation bilaterally, normal work of breathing GI: soft, nontender, nondistended, + BS MS: no deformity or atrophy  Skin: warm and dry, no rash Neuro:  Alert and Oriented x 3, Strength and sensation are intact Psych: euthymic mood, full affect  Wt Readings from Last 3 Encounters:  01/10/20 248 lb (112.5 kg)      Studies/Labs Reviewed:   EKG:  EKG is ordered today.  The ekg ordered today demonstrates NSR with nonspecific T wave abnormality  Recent Labs: No results found for requested labs within last 8760 hours.   Lipid Panel No results found for: CHOL, TRIG, HDL, CHOLHDL, VLDL, LDLCALC, LDLDIRECT  Additional studies/ records that were reviewed today include:  OV notes from PCP    ASSESSMENT:    1. Palpitations   2. Essential hypertension   3. Type 1 diabetes mellitus without complication (HCC)      PLAN:  In order of problems listed above:  1. Palpitations -unclear etiology  -will get event monitor to assess for arrhythmias -encouraged him to cut out caffiene  2.  HTN  -BP borderline controlled on exam -continue Olmesartan-HCT 40-25mg  daily and amlodipine 5mg  daily -followed by PCP  3.  Type 1 DM -followed by PCP   Medication Adjustments/Labs and Tests Ordered: Current medicines are reviewed at length with the patient today.  Concerns regarding medicines are outlined above.  Medication changes, Labs and Tests ordered today are listed in the Patient Instructions below.  There are no Patient Instructions on file for this visit.   Signed, Fransico Him, MD  01/10/2020 9:13 AM    [facility] [place], [place],   [address] Phone: [phone]; Fax: [phone]

## 2020-01-10 ENCOUNTER — Ambulatory Visit: Payer: 59 | Admitting: Cardiology

## 2020-01-10 ENCOUNTER — Encounter: Payer: Self-pay | Admitting: Cardiology

## 2020-01-10 ENCOUNTER — Telehealth: Payer: Self-pay | Admitting: Radiology

## 2020-01-10 ENCOUNTER — Other Ambulatory Visit: Payer: Self-pay

## 2020-01-10 VITALS — BP 138/94 | HR 82 | Ht 71.0 in | Wt 248.0 lb

## 2020-01-10 DIAGNOSIS — R002 Palpitations: Secondary | ICD-10-CM

## 2020-01-10 DIAGNOSIS — I10 Essential (primary) hypertension: Secondary | ICD-10-CM | POA: Diagnosis not present

## 2020-01-10 DIAGNOSIS — E109 Type 1 diabetes mellitus without complications: Secondary | ICD-10-CM

## 2020-01-10 NOTE — Telephone Encounter (Signed)
Enrolled patient for a 14 day Zio monitor to be mailed to patients home.  

## 2020-01-10 NOTE — Patient Instructions (Signed)
Medication Instructions:  Your physician recommends that you continue on your current medications as directed. Please refer to the Current Medication list given to you today.  *If you need a refill on your cardiac medications before your next appointment, please call your pharmacy*  Testing/Procedures: Your physician has recommended that you wear an event monitor. Event monitors are medical devices that record the heart's electrical activity. Doctors most often [place] these monitors to diagnose arrhythmias. Arrhythmias are problems with the speed or rhythm of the heartbeat. The monitor is a small, portable device. You can wear one while you do your normal daily activities. This is usually used to diagnose what is causing palpitations/syncope (passing out).  Follow-Up: At [facility], you and your health needs are our priority.  As part of our continuing mission to provide you with exceptional heart care, we have created designated Provider Care Teams.  These Care Teams include your primary Cardiologist (physician) and Advanced Practice Providers (APPs -  Physician Assistants and Nurse Practitioners) who all work together to provide you with the care you need, when you need it.  We recommend signing up for the patient portal called "MyChart".  Sign up information is provided on this After Visit Summary.  MyChart is used to connect with patients for Virtual Visits (Telemedicine).  Patients are able to view lab/test results, encounter notes, upcoming appointments, etc.  Non-urgent messages can be sent to your provider as well.   To learn more about what you can do with MyChart, go to [email or web address].    Follow up with Dr. Mayford Knife as needed based on results of testing.    Other Instructions ZIO XT- Long Term Monitor Instructions   Your physician has requested you wear your ZIO patch monitor_14_days.   This is a single patch monitor.  [employer] supplies one patch monitor per enrollment.   Additional stickers are not available.   Please do not apply patch if you will be having a Nuclear Stress Test, Echocardiogram, Cardiac CT, MRI, or Chest Xray during the time frame you would be wearing the monitor. The patch cannot be worn during these tests.  You cannot remove and re-apply the ZIO XT patch monitor.   Your ZIO patch monitor will be sent USPS Priority mail from [facility] directly to your home address. The monitor may also be mailed to a PO BOX if home delivery is not available.   It may take 3-5 days to receive your monitor after you have been enrolled.   Once you have received you monitor, please review enclosed instructions.  Your monitor has already been registered assigning a specific monitor serial # to you.   Applying the monitor   Shave hair from upper left chest.   Hold abrader disc by orange tab.  Rub abrader in 40 strokes over left upper chest as indicated in your monitor instructions.   Clean area with 4 enclosed alcohol pads .  Use all pads to assure are is cleaned thoroughly.  Let dry.   Apply patch as indicated in monitor instructions.  Patch will be place under collarbone on left side of chest with arrow pointing upward.   Rub patch adhesive wings for 2 minutes.Remove white label marked "1".  Remove white label marked "2".  Rub patch adhesive wings for 2 additional minutes.   While looking in a mirror, press and release button in center of patch.  A small green light will flash 3-4 times .  This will be your only indicator the monitor  has been turned on.     Do not shower for the first 24 hours.  You may shower after the first 24 hours.   Press button if you feel a symptom. You will hear a small click.  Record Date, Time and Symptom in the Patient Log Book.   When you are ready to remove patch, follow instructions on last 2 pages of Patient Log Book.  Stick patch monitor onto last page of Patient Log Book.   Place Patient Log Book in [place] box.   Use locking tab on box and tape box closed securely.  The [employer] and [employer] has [employer] on it.  Please place in mailbox as soon as possible.  Your physician should have your test results approximately 7 days after the monitor has been mailed back to [facility].   Call [facility] at [phone] if you have questions regarding your ZIO XT patch monitor.  Call them immediately if you see an orange light blinking on your monitor.   If your monitor falls off in less than 4 days contact our Monitor department at [phone].  If your monitor becomes loose or falls off after 4 days call [employer] at [phone] for suggestions on securing your monitor.

## 2020-01-13 ENCOUNTER — Ambulatory Visit (INDEPENDENT_AMBULATORY_CARE_PROVIDER_SITE_OTHER): Payer: 59

## 2020-01-13 DIAGNOSIS — R002 Palpitations: Secondary | ICD-10-CM | POA: Diagnosis not present

## 2020-02-22 ENCOUNTER — Telehealth: Payer: Self-pay

## 2020-02-22 MED ORDER — METOPROLOL TARTRATE 25 MG PO TABS
25.0000 mg | ORAL_TABLET | Freq: Two times a day (BID) | ORAL | 3 refills | Status: DC
Start: 2020-02-22 — End: 2021-06-30

## 2020-02-22 NOTE — Telephone Encounter (Signed)
The patient has been notified of the result and verbalized understanding.  All questions (if any) were answered. Theresia Majors, RN 02/22/2020 10:05 AM  Rx has been sent in

## 2020-02-22 NOTE — Telephone Encounter (Signed)
-----   Message from Lars Masson, MD sent at 02/21/2020  4:45 PM EDT ----- Start metoprolol 25 mg po BID

## 2020-10-16 ENCOUNTER — Other Ambulatory Visit: Payer: Self-pay

## 2020-10-16 ENCOUNTER — Encounter: Payer: Self-pay | Admitting: Cardiology

## 2020-10-16 ENCOUNTER — Ambulatory Visit: Payer: 59 | Admitting: Cardiology

## 2020-10-16 VITALS — BP 136/84 | HR 83 | Ht 71.0 in | Wt 250.0 lb

## 2020-10-16 DIAGNOSIS — I471 Supraventricular tachycardia, unspecified: Secondary | ICD-10-CM

## 2020-10-16 DIAGNOSIS — I10 Essential (primary) hypertension: Secondary | ICD-10-CM | POA: Diagnosis not present

## 2020-10-16 DIAGNOSIS — E109 Type 1 diabetes mellitus without complications: Secondary | ICD-10-CM

## 2020-10-16 MED ORDER — DILTIAZEM HCL ER COATED BEADS 240 MG PO CP24: 240.0000 mg | ORAL_CAPSULE | Freq: Every day | ORAL | 3 refills | Status: DC

## 2020-10-16 NOTE — Addendum Note (Signed)
Addended by: Theresia Majors on: 10/16/2020 11:28 AM   Modules accepted: Orders

## 2020-10-16 NOTE — Patient Instructions (Signed)
TAKE YOUR BLOOD PRESSURE AND HEART RATE DAILY FOR ONE WEEK AND CALL us WITH A LIST OF YOUR READINGS.   Medication Instructions:  Your physician has recommended you make the following change in your medication:  1) STOP taking amlodipine 2) START taking Cardizem CD (diltiazem) 240 mg daily   *If you need a refill on your cardiac medications before your next appointment, please call your pharmacy*  Follow-Up: At [facility], you and your health needs are our priority.  As part of our continuing mission to provide you with exceptional heart care, we have created designated Provider Care Teams.  These Care Teams include your primary Cardiologist (physician) and Advanced Practice Providers (APPs -  Physician Assistants and Nurse Practitioners) who all work together to provide you with the care you need, when you need it.  Your next appointment:   6 month(s)  The format for your next appointment:   In Person  Provider:   You may see Armanda Magic, MD or one of the following Advanced Practice Providers on your designated Care Team:    Ronie Spies, PA-C  Jacolyn Reedy, PA-C

## 2020-10-16 NOTE — Progress Notes (Signed)
Cardiology Consult Note    Date:  10/16/2020   ID:  Jorge Calhoun, DOB December 11, 1965, MRN [id number]  PCP:  Tally Joe, MD  Cardiologist:  Armanda Magic, MD   Chief Complaint  Patient presents with  . Follow-up    PSVT    History of Present Illness:  Jorge Calhoun is a 55 y.o. male  with a hx of nontoxic multinodular goiter, HTN, Obesity and Type 1 DM who recently saw his PCP and palpitations.   Event monitor done 02/2020 showed NSR with with short bursts of SVT lasting as long as 50 seconds at 190bpm and isolated PVCs and PACs.  He was started on Lopressor 25mg  BID.  He is here today for followup and is doing well.  He denies any chest pain or pressure, SOB, DOE, PND, orthopnea, LE edema, dizziness, palpitations or syncope. He is compliant with his meds and is tolerating meds with no SE except the metoprolol which he had to stop because it was giving him cold hands and feet.    Past Medical History:  Diagnosis Date  . Hypertension   . Nontoxic multinodular goiter   . Obesity, unspecified   . Plantar fasciitis   . PSVT (paroxysmal supraventricular tachycardia) (HCC)    noted on heart monitor 02/2020  . Type I (juvenile type) diabetes mellitus without mention of complication, not stated as uncontrolled     No past surgical history on file.  Current Medications: Current Meds  Medication Sig  . amLODipine (NORVASC) 10 MG tablet Take 10 mg by mouth daily.  03/2020 aspirin 81 MG tablet Take 81 mg by mouth daily.  Marland Kitchen atorvastatin (LIPITOR) 10 MG tablet Take 10 mg by mouth at bedtime.  . hydrochlorothiazide (HYDRODIURIL) 25 MG tablet Take 25 mg by mouth every morning.  . insulin lispro (HUMALOG) 100 UNIT/ML injection Inject 30 Units into the skin 3 (three) times daily before meals.  . metoprolol tartrate (LOPRESSOR) 25 MG tablet Take 1 tablet (25 mg total) by mouth 2 (two) times daily.  . Multiple Vitamin (MULTIVITAMIN) capsule Take 1 capsule by mouth daily.  Marland Kitchen olmesartan (BENICAR)  40 MG tablet Take 40 mg by mouth daily.  Marland Kitchen FLEXTOUCH 100 UNIT/ML FlexTouch Pen Inject 25-50 Units into the skin daily.    Allergies:   Metformin and related   Social History   Socioeconomic History  . Marital status: Married    Spouse name: Not on file  . Number of children: Not on file  . Years of education: Not on file  . Highest education level: Not on file  Occupational History  . Not on file  Tobacco Use  . Smoking status: Never Smoker  . Smokeless tobacco: Never Used  Substance and Sexual Activity  . Alcohol use: Not on file  . Drug use: Not on file  . Sexual activity: Not on file  Other Topics Concern  . Not on file  Social History Narrative  . Not on file   Social Determinants of Health   Financial Resource Strain: Not on file  Food Insecurity: Not on file  Transportation Needs: Not on file  Physical Activity: Not on file  Stress: Not on file  Social Connections: Not on file     Family History:  The patient's family history includes Cancer - Ovarian in his maternal grandmother; Diabetes in his father and paternal grandmother; Hypertension in his father and mother.   ROS:   Please see the history of present illness.  ROS All other systems reviewed and are negative.  No flowsheet data found.   PHYSICAL EXAM:   VS:  BP 136/84   Pulse 83   Ht 5\' 11"  (1.803 m)   Wt 250 lb (113.4 kg)   SpO2 96%   BMI 34.87 kg/m    GEN: Well nourished, well developed in no acute distress HEENT: Normal NECK: No JVD; No carotid bruits LYMPHATICS: No lymphadenopathy CARDIAC:RRR, no murmurs, rubs, gallops RESPIRATORY:  Clear to auscultation without rales, wheezing or rhonchi  ABDOMEN: Soft, non-tender, non-distended MUSCULOSKELETAL:  No edema; No deformity  SKIN: Warm and dry NEUROLOGIC:  Alert and oriented x 3 PSYCHIATRIC:  Normal affect    Wt Readings from Last 3 Encounters:  10/16/20 250 lb (113.4 kg)  01/10/20 248 lb (112.5 kg)      Studies/Labs  Reviewed:   EKG:  EKG is not ordered today.    Recent Labs: No results found for requested labs within last 8760 hours.   Lipid Panel No results found for: CHOL, TRIG, HDL, CHOLHDL, VLDL, LDLCALC, LDLDIRECT  Additional studies/ records that were reviewed today include:  OV notes from PCP  ASSESSMENT:    1. PSVT (paroxysmal supraventricular tachycardia) (HCC)   2. Essential hypertension   3. Type 1 diabetes mellitus without complication (HCC)      PLAN:  In order of problems listed above:  1.  PSVT -noted to have short bursts of SVT lasting as long as 50 seconds at 190bpm and isolated PVCs and PACs on heart monitor 02/2020 -he has not had any palpitations since being placed on BB but had to stop the lopressor due to getting coldness in his hand and feet with changes in color that sound like Raynauds vasospastic disorder triggered by beta blocker -change amlodipine to Cardizem CD 240mg  daily  2.  HTN  -BP controlled on exam today -continue Olmesartan-HCT 40-25mg  daily  -as above changing amlodipine to Cardizem to suppress SVT -I have asked him to check his BP and HR daily for a week and call with results  3.  Type 1 DM -followed by PCP   Medication Adjustments/Labs and Tests Ordered: Current medicines are reviewed at length with the patient today.  Concerns regarding medicines are outlined above.  Medication changes, Labs and Tests ordered today are listed in the Patient Instructions below.  There are no Patient Instructions on file for this visit.   Signed, 03/2020, MD  10/16/2020 11:20 AM    [facility] [address] [place], [place], [place]  [place] Phone: [phone]; Fax: [phone]

## 2021-02-26 ENCOUNTER — Other Ambulatory Visit: Payer: Self-pay

## 2021-02-26 MED ORDER — ATORVASTATIN CALCIUM 10 MG PO TABS: 10.0000 mg | ORAL_TABLET | Freq: Every day | ORAL | 2 refills | Status: DC

## 2021-03-26 ENCOUNTER — Telehealth: Payer: Self-pay | Admitting: Cardiology

## 2021-03-26 NOTE — Telephone Encounter (Signed)
Left message for patient to call back  

## 2021-03-26 NOTE — Telephone Encounter (Signed)
Pt c/o medication issue:  1. Name of Medication: Diltiazem  how are you currently taking this medication (dosage and times per day)? 1 time a day  3. Are you having a reaction (difficulty breathing--STAT)? no  4. What is your medication issue? Patient says the medicine is not bringing his bottom number down of his blood pressure

## 2021-03-27 NOTE — Telephone Encounter (Signed)
Spoke with the patient who states that his blood pressure has been running in the 120s/80s-90. He reports that his heart rate has been in the 70s. He states that he has been feeling good and has not had any palpitations. Advised patient to continue current medications. He is due for a 6 month FU with Dr. Mayford Knife which I have scheduled.

## 2021-03-27 NOTE — Telephone Encounter (Signed)
Patient returning call.

## 2021-05-21 ENCOUNTER — Telehealth: Payer: Self-pay | Admitting: Cardiology

## 2021-05-21 MED ORDER — DILTIAZEM HCL ER COATED BEADS 300 MG PO CP24: 300.0000 mg | ORAL_CAPSULE | Freq: Every day | ORAL | 3 refills | Status: DC

## 2021-05-21 NOTE — Telephone Encounter (Signed)
Spoke with the patient who reports that he has discontinued taking amlodipine as advised when he last saw Dr. Mayford Knife 10/2020. He states that when he went to his PCP recently they realized that he had actually restarted taking it. Patient thinks that his wife accidentally put it back in his medications. He states that he has since stopping taking amlodipine. He has noticed that his blood pressure has been elevated ever since stopping. Reports recent readings of 124/105, 130/98, and 130/102. He states that he feels alright other than feeling his heart thumping at times. He states that his heart rate has been in the 70s.  He reports taking HCTZ 25 mg daily, diltiazem 240 mg daily, and olmesartan 40 mg daily. He states that he is not taking metoprolol.

## 2021-05-21 NOTE — Telephone Encounter (Signed)
Spoke with the patient and gave him recommendations per Dr. Mayford Knife. Patient verbalized understanding.

## 2021-05-21 NOTE — Telephone Encounter (Signed)
  Pt c/o BP issue: STAT if pt c/o blurred vision, one-sided weakness or slurred speech  1. What are your last 5 BP readings? 130/102  2. Are you having any other symptoms (ex. Dizziness, headache, blurred vision, passed out)?   3. What is your BP issue? Pt said his BP is elevated today, he said is HR is normal. The only symptoms he has is he can feel his heart beating fast. Also, he said his wife messed up his meds and he was taking amlodipine unintentionally. Not sure if that's causing his elevated bP

## 2021-06-09 ENCOUNTER — Telehealth: Payer: Self-pay | Admitting: Cardiology

## 2021-06-09 NOTE — Telephone Encounter (Signed)
Patient called in to give BP readings: 137/96  HR 69 141/100       80 127/94        79 141/97        77 133/94                   72 132/98        79 128/88        72 126/88        69 128/92        78 130/86        75 126/90        80

## 2021-06-10 MED ORDER — DILTIAZEM HCL ER COATED BEADS 360 MG PO CP24: 360.0000 mg | ORAL_CAPSULE | Freq: Every day | ORAL | 3 refills | Status: DC

## 2021-06-10 NOTE — Telephone Encounter (Signed)
Spoke with the patient and advised him on Dr. Norris Cross recommendations to increase diltiazem to 360 mg daily. Patient verbalized understanding. He has also been scheduled to see PharmD next week.

## 2021-06-19 ENCOUNTER — Ambulatory Visit: Payer: 59

## 2021-06-30 ENCOUNTER — Other Ambulatory Visit: Payer: Self-pay

## 2021-06-30 ENCOUNTER — Ambulatory Visit: Payer: 59 | Admitting: Pharmacist

## 2021-06-30 DIAGNOSIS — I10 Essential (primary) hypertension: Secondary | ICD-10-CM

## 2021-06-30 MED ORDER — CHLORTHALIDONE 25 MG PO TABS: 12.5000 mg | ORAL_TABLET | Freq: Every day | ORAL | 3 refills | Status: DC

## 2021-06-30 NOTE — Progress Notes (Signed)
Patient ID: Jorge Calhoun                 DOB: 10-23-1965                      MRN: [id number]     HPI: Jorge Calhoun is a 55 y.o. male referred by Dr. Mayford Calhoun to HTN clinic. PMH is significant for nontoxic multinodular goiter, HTN, Obesity, PVC's, PAC's and SVT and Type 1 DM. Patient was on metoprolol for his palpitations, but he had to stop the lopressor due to getting coldness in his hand and feet with changes in color that sound like Raynauds vasospastic disorder triggered by beta blocker. Placed on Cardizem. It was discovered recently that he was accidentally taking amlodipine and Cardizem. Amlodipine was stopped and Cardizem was increased to 360mg  daily.   Patient presents today to HTN clinic. He has not been checking BP at home since diltiazem was increased to 360mg . He states that ever since his feet have been cold. Denies any numbness or tingling. He denies dizziness, lightheadedness, headache, blurred vision, SOB or swelling. He only takes IBU once every 2-3 months for back pain. No decongestants. Has been working 12 hours shifts at work - 7 days a week. Does have this weekend off but will still work 60 hr this week. He is under stress in his personal life as well. BP in clinic today was 124/86 on repeat.  Current HTN meds: diltiazem CD 360mg  daily, HCTZ 25mg  daily, Olmesartan 40mg  daily Previously tried: amlodipine (stopped to add diltiazem for palpitations), metoprolol (Raynauds) BP goal: <130/80  Family History: The patient's family history includes Cancer - Ovarian in his maternal grandmother; Diabetes in his father and paternal grandmother; Hypertension in his father and mother.   Social History: Never smoked, no ETOH, no illict drugs  Diet: drinks diet sodas 1-2 cans/day, coffee on rare occasion   Exercise: was walking but works 12hr a day 7 days a week  Home BP readings:  readings are prior to increasing diltiazem 137/96             HR 69 141/100                  80 127/94                   79 141/97                   77 133/94                   72 132/98                   79 128/88                   72 126/88                   69 128/92                   78 130/86                   75 126/90                   80  Labs: 03/24/21 Scr 1.01, K 3.7, Na 139  Wt Readings from Last 3 Encounters:  10/16/20 250 lb (113.4 kg)  01/10/20 248 lb (112.5 kg)   BP Readings from Last  3 Encounters:  10/16/20 136/84  01/10/20 (!) 138/94   Pulse Readings from Last 3 Encounters:  10/16/20 83  01/10/20 82    Renal function: CrCl cannot be calculated (No successful lab value found.).  Past Medical History:  Diagnosis Date   Hypertension    Nontoxic multinodular goiter    Obesity, unspecified    Plantar fasciitis    PSVT (paroxysmal supraventricular tachycardia) (HCC)    noted on heart monitor 02/2020   Type I (juvenile type) diabetes mellitus without mention of complication, not stated as uncontrolled     Current Outpatient Medications on File Prior to Visit  Medication Sig Dispense Refill   aspirin 81 MG tablet Take 81 mg by mouth daily.     atorvastatin (LIPITOR) 10 MG tablet Take 1 tablet (10 mg total) by mouth at bedtime. 90 tablet 2   diltiazem (CARDIZEM CD) 360 MG 24 hr capsule Take 1 capsule (360 mg total) by mouth daily. 90 capsule 3   hydrochlorothiazide (HYDRODIURIL) 25 MG tablet Take 25 mg by mouth every morning.     insulin lispro (HUMALOG) 100 UNIT/ML injection Inject 30 Units into the skin 3 (three) times daily before meals.     metoprolol tartrate (LOPRESSOR) 25 MG tablet Take 1 tablet (25 mg total) by mouth 2 (two) times daily. 180 tablet 3   Multiple Vitamin (MULTIVITAMIN) capsule Take 1 capsule by mouth daily.     olmesartan (BENICAR) 40 MG tablet Take 40 mg by mouth daily.     TRESIBA FLEXTOUCH 100 UNIT/ML FlexTouch Pen Inject 25-50 Units into the skin daily.     No current facility-administered medications on file prior to visit.     Allergies  Allergen Reactions   Metformin And Related Nausea Only    There were no vitals taken for this visit.   Assessment/Plan:  1. Hypertension - BP is above goal of <130/80 in clinic. Will try swapping out HCTZ for chlorthalidone 12.5mg  daily. Recheck BMP in 2 weeks. I advised he can try decreasing diltiazem to 300mg  to see if it helps his feet. Continue olmesartan 40mg  daily. Check blood pressure at home and bring in log and meter to next appointment. Follow up in clinic in ~ 2 weeks.   Thank you  , Pharm.D, BCPS, CPP [facility]  [address] [place], [address] [place]  Phone: [phone]; Fax: [phone]

## 2021-06-30 NOTE — Patient Instructions (Addendum)
Decrease diltiazem to 300mg  daily STOP Hydrochlorothiazide (HCTZ)  START chlorthalidone 12.5mg  (1/2 tablet) daily Please check your blood pressure at home. Make sure you have been resting for 5 min before checking. Please bring in your blood pressure log and cuff with you to your next appointment. Call me at [phone] with any questions

## 2021-07-17 ENCOUNTER — Ambulatory Visit: Payer: 59 | Admitting: Pharmacist

## 2021-07-17 ENCOUNTER — Other Ambulatory Visit: Payer: Self-pay

## 2021-07-17 VITALS — BP 128/84 | HR 79

## 2021-07-17 DIAGNOSIS — I10 Essential (primary) hypertension: Secondary | ICD-10-CM | POA: Diagnosis not present

## 2021-07-17 LAB — BASIC METABOLIC PANEL
BUN/Creatinine Ratio: 10 (ref 9–20)
BUN: 12 mg/dL (ref 6–24)
CO2: 28 mmol/L (ref 20–29)
Calcium: 9.1 mg/dL (ref 8.7–10.2)
Chloride: 100 mmol/L (ref 96–106)
Creatinine, Ser: 1.15 mg/dL (ref 0.76–1.27)
Glucose: 157 mg/dL — ABNORMAL HIGH (ref 70–99)
Potassium: 3.7 mmol/L (ref 3.5–5.2)
Sodium: 139 mmol/L (ref 134–144)
eGFR: 75 mL/min/{1.73_m2} (ref >59)

## 2021-07-17 NOTE — Patient Instructions (Addendum)
Continue diltiazem CD 300mg  daily, chlorthalidone 12.5mg  daily, Olmesartan 40mg  daily  I will call you tomorrow with your lab results. If everything looks good we will plan to increase chlorthalidone to 25mg  daily  Please call me at [phone] with any questions

## 2021-07-17 NOTE — Progress Notes (Signed)
Patient ID: RICKE KIMOTO                 DOB: Oct 12, 1965                      MRN: [id number]     HPI: Jorge Calhoun is a 55 y.o. male referred by Dr. Mayford Knife to HTN clinic. PMH is significant for nontoxic multinodular goiter, HTN, Obesity, PVC's, PAC's and SVT and Type 1 DM. Patient was on metoprolol for his palpitations, but he had to stop the lopressor due to getting coldness in his hand and feet with changes in color that sound like Raynauds vasospastic disorder triggered by beta blocker. Placed on Cardizem. It was discovered recently that he was accidentally taking amlodipine and Cardizem. Amlodipine was stopped and Cardizem was increased to 360mg  daily.   At last visit with PharmD patient reported that since he increased diltiazem to 360mg  his feet have been cold. He was advised he could decrease diltiazem back to 300mg . HCTZ was changed to chlorthalidone 12.5mg  daily. He was asked to check BP at home.  Patient presents today to HTN clinic. He brings in blood pressure readings that are improved from last time. He has decreased his dilitazem to 300mg  daily. Feet are still cold, but states its ok. He denies dizziness, lightheadedness, headache, blurred vision, SOB or swelling. Has been working 12 hours shifts at work - 7 days a week, night shift 4PM to 4AM. Only gets about 4hr of sleep a night. He is under stress in his personal life as well. He brings in his home OMRON wrist cuff. Found to be accurate.  Home BP cuff 1st reading: 145/82 Home BP cuff  Second reading:143/84 Clinic cuff 140/82 Home BP cuff: 3rd reading: 130/80 Second off reading: 128/84  Current HTN meds: diltiazem CD 300mg  daily, chlorthalidone 12.5mg  daily, Olmesartan 40mg  daily Previously tried: amlodipine (stopped to add diltiazem for palpitations), metoprolol (Raynauds) BP goal: <130/80  Family History: The patient's family history includes Cancer - Ovarian in his maternal grandmother; Diabetes in his father and  paternal grandmother; Hypertension in his father and mother.   Social History: Never smoked, no ETOH, no illict drugs  Diet: drinks diet sodas 1-2 cans/day, coffee on rare occasion   Exercise: was walking but works 12hr a day 7 days a week  Home BP readings:  143/91 HR 75 130/92 HR 78 143/91 HR 85 124/86 HR 81 1304/90 HR 71 120/79 HR 93 138/84 HR 92 130/75 HR 88  Labs: 03/24/21 Scr 1.01, K 3.7, Na 139  Wt Readings from Last 3 Encounters:  10/16/20 250 lb (113.4 kg)  01/10/20 248 lb (112.5 kg)   BP Readings from Last 3 Encounters:  06/30/21 124/86  10/16/20 136/84  01/10/20 (!) 138/94   Pulse Readings from Last 3 Encounters:  06/30/21 75  10/16/20 83  01/10/20 82    Renal function: CrCl cannot be calculated (No successful lab value found.).  Past Medical History:  Diagnosis Date   Hypertension    Nontoxic multinodular goiter    Obesity, unspecified    Plantar fasciitis    PSVT (paroxysmal supraventricular tachycardia) (HCC)    noted on heart monitor 02/2020   Type I (juvenile type) diabetes mellitus without mention of complication, not stated as uncontrolled     Current Outpatient Medications on File Prior to Visit  Medication Sig Dispense Refill   aspirin 81 MG tablet Take 81 mg by mouth daily.  atorvastatin (LIPITOR) 10 MG tablet Take 1 tablet (10 mg total) by mouth at bedtime. 90 tablet 2   chlorthalidone (HYGROTON) 25 MG tablet Take 0.5 tablets (12.5 mg total) by mouth daily. 45 tablet 3   diltiazem (CARDIZEM CD) 300 MG 24 hr capsule Take 1 capsule (300 mg total) by mouth daily. 90 capsule    insulin lispro (HUMALOG) 100 UNIT/ML injection Inject 30 Units into the skin 3 (three) times daily before meals.     Multiple Vitamin (MULTIVITAMIN) capsule Take 1 capsule by mouth daily.     olmesartan (BENICAR) 40 MG tablet Take 40 mg by mouth daily.     TRESIBA FLEXTOUCH 100 UNIT/ML FlexTouch Pen Inject 25 Units into the skin daily.     No current  facility-administered medications on file prior to visit.    Allergies  Allergen Reactions   Metformin And Related Nausea Only    There were no vitals taken for this visit.   Assessment/Plan:  1. Hypertension - BP is above goal of <130/80 in clinic. Diastolic still a little high. Will check a BMP today. If stable, will increase chlorthalidone to 25mg  daily. Continue diltiazem CD 300mg  daily and Olmesartan 40mg  daily. Pt will follow up with Dr. on 12/12. If BP still high at that point, will schedule follow up back in HTN clinic.  Thank you  , Pharm.D, BCPS, CPP [facility]  [address] [place], [place] [address]  Phone: [phone]; Fax: [phone]

## 2021-07-18 ENCOUNTER — Telehealth: Payer: Self-pay | Admitting: Pharmacist

## 2021-07-18 ENCOUNTER — Other Ambulatory Visit: Payer: Self-pay | Admitting: Pharmacist

## 2021-07-18 DIAGNOSIS — I10 Essential (primary) hypertension: Secondary | ICD-10-CM

## 2021-07-18 MED ORDER — CHLORTHALIDONE 25 MG PO TABS: 25.0000 mg | ORAL_TABLET | Freq: Every day | ORAL | 3 refills | Status: DC

## 2021-07-18 NOTE — Telephone Encounter (Signed)
Labs stable after starting chlorthalidone 12.5mg  daily. Will increase to 25mg  daily. Patient called and made aware. Rx sent to pharamcy.

## 2021-07-28 ENCOUNTER — Ambulatory Visit: Payer: 59 | Admitting: Cardiology

## 2021-07-28 ENCOUNTER — Other Ambulatory Visit: Payer: 59 | Admitting: *Deleted

## 2021-07-28 DIAGNOSIS — I10 Essential (primary) hypertension: Secondary | ICD-10-CM

## 2021-07-28 LAB — BASIC METABOLIC PANEL
BUN/Creatinine Ratio: 15 (ref 9–20)
BUN: 16 mg/dL (ref 6–24)
CO2: 29 mmol/L (ref 20–29)
Calcium: 9.2 mg/dL (ref 8.7–10.2)
Chloride: 101 mmol/L (ref 96–106)
Creatinine, Ser: 1.08 mg/dL (ref 0.76–1.27)
Glucose: 138 mg/dL — ABNORMAL HIGH (ref 70–99)
Potassium: 3.7 mmol/L (ref 3.5–5.2)
Sodium: 143 mmol/L (ref 134–144)
eGFR: 81 mL/min/{1.73_m2} (ref >59)

## 2021-07-29 ENCOUNTER — Telehealth: Payer: Self-pay | Admitting: Pharmacist

## 2021-07-29 NOTE — Telephone Encounter (Signed)
Labs stable. Continue diltiazem CD 300mg  daily, chlorthalidone 25mg  daily, Olmesartan 40mg  daily. Patient made aware of results. F/u in office 12/3

## 2021-08-15 ENCOUNTER — Other Ambulatory Visit: Payer: Self-pay

## 2021-08-15 ENCOUNTER — Ambulatory Visit (INDEPENDENT_AMBULATORY_CARE_PROVIDER_SITE_OTHER): Payer: 59 | Admitting: Pharmacist

## 2021-08-15 VITALS — BP 124/78 | HR 79

## 2021-08-15 DIAGNOSIS — I10 Essential (primary) hypertension: Secondary | ICD-10-CM

## 2021-08-15 MED ORDER — DILTIAZEM HCL ER COATED BEADS 300 MG PO CP24: 300.0000 mg | ORAL_CAPSULE | Freq: Every day | ORAL | 3 refills | Status: DC

## 2021-08-15 NOTE — Progress Notes (Signed)
Patient ID: Jorge Calhoun                 DOB: 08-Jan-1966                      MRN: [id number]     HPI: Jorge Calhoun is a 55 y.o. male referred by Dr. Mayford Knife to HTN clinic. PMH is significant for nontoxic multinodular goiter, HTN, Obesity, PVC's, PAC's and SVT and Type 1 DM. Patient was on metoprolol for his palpitations, but he had to stop the lopressor due to getting coldness in his hand and feet with changes in color that sound like Raynauds vasospastic disorder triggered by beta blocker. Placed on Cardizem. It was discovered recently that he was accidentally taking amlodipine and Cardizem. Amlodipine was stopped and Cardizem was increased to 360mg  daily but was decreased to 300mg .  At last visit with PharmD, chlorthalidone was increased to 25mg  daily. Repeat BMP was stable. Patient presents today to HTN clinic. Just got back from . Has not checked his blood pressure. Only complaint is that his feet are still cold, but states its ok. He denies dizziness, lightheadedness, headache, blurred vision, SOB or swelling. Will soon resume working 12 hours shifts at work - 7 days a week, night shift 4PM to 4AM. Only gets about 4hr of sleep a night. He is under stress in his personal life as well. OMRON wrist cuff previously found to be accurate.  Current HTN meds: diltiazem CD 300mg  daily, chlorthalidone 25mg  daily, olmesartan 40mg  daily Previously tried: amlodipine (stopped to add diltiazem for palpitations), metoprolol (Raynauds) BP goal: <130/80  Family History: The patient's family history includes Cancer - Ovarian in his maternal grandmother; Diabetes in his father and paternal grandmother; Hypertension in his father and mother.   Social History: Never smoked, no ETOH, no illict drugs  Diet: drinks diet sodas 1-2 cans/day, coffee on rare occasion   Exercise: was walking but works 12hr a day 7 days a week  Home BP readings:  No readings  Labs:  07/28/21 Scr 0.28 K 3.7 Na  143 03/24/21 Scr 1.01, K 3.7, Na 139  Wt Readings from Last 3 Encounters:  10/16/20 250 lb (113.4 kg)  01/10/20 248 lb (112.5 kg)   BP Readings from Last 3 Encounters:  07/17/21 128/84  06/30/21 124/86  10/16/20 136/84   Pulse Readings from Last 3 Encounters:  07/17/21 79  06/30/21 75  10/16/20 83    Renal function: CrCl cannot be calculated (Unknown ideal weight.).  Past Medical History:  Diagnosis Date   Hypertension    Nontoxic multinodular goiter    Obesity, unspecified    Plantar fasciitis    PSVT (paroxysmal supraventricular tachycardia) (HCC)    noted on heart monitor 02/2020   Type I (juvenile type) diabetes mellitus without mention of complication, not stated as uncontrolled     Current Outpatient Medications on File Prior to Visit  Medication Sig Dispense Refill   aspirin 81 MG tablet Take 81 mg by mouth daily.     atorvastatin (LIPITOR) 10 MG tablet Take 1 tablet (10 mg total) by mouth at bedtime. 90 tablet 2   chlorthalidone (HYGROTON) 25 MG tablet Take 1 tablet (25 mg total) by mouth daily. 90 tablet 3   diltiazem (CARDIZEM CD) 300 MG 24 hr capsule Take 1 capsule (300 mg total) by mouth daily. 90 capsule    insulin lispro (HUMALOG) 100 UNIT/ML injection Inject 30 Units into the skin 3 (three) times  daily before meals.     Multiple Vitamin (MULTIVITAMIN) capsule Take 1 capsule by mouth daily.     olmesartan (BENICAR) 40 MG tablet Take 40 mg by mouth daily.     TRESIBA FLEXTOUCH 100 UNIT/ML FlexTouch Pen Inject 25 Units into the skin daily.     No current facility-administered medications on file prior to visit.    Allergies  Allergen Reactions   Metformin And Related Nausea Only    There were no vitals taken for this visit.   Assessment/Plan:  1. Hypertension - BP is at goal of <130/80 in clinic. Continue diltiazem CD 300mg  daily, chlorthalidone 25mg  daily and olmesartan 40mg  daily. I have asked patient to resume checking his blood pressure. I have  asked him to send me his blood pressure reading via mychart in a few weeks to make sure BP is consistently at goal. Follow up in clinic as needed.  Thank you  , Pharm.D, BCPS, CPP [facility]  [address], [place], [place] [address]  Phone: [phone]; Fax: [phone]

## 2021-08-15 NOTE — Patient Instructions (Addendum)
Please continue diltiazem CD 300mg  daily, chlorthalidone 25mg  daily and Olmesartan 40mg  daily  Please start checking your blood pressure at home again.  I will contact you in a few weeks for some readings  Call me at [phone] with any questions

## 2021-08-26 ENCOUNTER — Other Ambulatory Visit: Payer: Self-pay | Admitting: Pharmacist

## 2021-08-26 MED ORDER — CHLORTHALIDONE 25 MG PO TABS: 25.0000 mg | ORAL_TABLET | Freq: Every day | ORAL | 3 refills | Status: DC

## 2021-09-05 ENCOUNTER — Encounter: Payer: Self-pay | Admitting: Pharmacist

## 2021-10-10 ENCOUNTER — Telehealth: Payer: Self-pay | Admitting: Pharmacist

## 2021-10-10 NOTE — Telephone Encounter (Signed)
Called patient to see how his blood pressure has been. Report BP running ~120/78. He should continue current medications and let us know if he has any issues.

## 2021-10-17 ENCOUNTER — Other Ambulatory Visit: Payer: Self-pay | Admitting: Cardiology

## 2021-10-20 ENCOUNTER — Ambulatory Visit: Payer: 59 | Admitting: Cardiology

## 2021-11-19 NOTE — Progress Notes (Signed)
? ?Cardiology Office Note   ? ?Date:  12/02/2021  ? ?ID:  Jorge Calhoun, DOB 03/08/1966, MRN [id number] ? ? ?PCP:  Tally Joe, MD ?  ?[facility]  ?Cardiologist:  Armanda Magic, MD   ?Advanced Practice Provider:  No care team member to display ?Electrophysiologist:  None  ? ?[id number]}  ? ?Chief Complaint  ?Patient presents with  ? Follow-up  ? ? ?History of Present Illness:  ?Jorge Calhoun is a 56 y.o. male  with history of PSVT, HTN, DM1. ? ?Patient last saw Dr. Mayford Knife 10/16/20 and he had stopped BB due to cold hands and feet that sounded like Raynaud's vasospastic disorder triggered by BB. She changed amlodipine to diltiazem. Patient was then seen in HTN clinic several times, most recently 07/2021. ? ?Patient comes in for f/u. BP running 120/79-84.watches his salt closely. Occasional palpitation if he drinks coffee so he avoids. Denies chest pain, dyspnea. Does elliptical 30 min 3 times a week. Walks in the park. He works as a Curator. Just started a diet with his wife last week. ? ? ? ?Past Medical History:  ?Diagnosis Date  ? Hypertension   ? Nontoxic multinodular goiter   ? Obesity, unspecified   ? Plantar fasciitis   ? PSVT (paroxysmal supraventricular tachycardia) (HCC)   ? noted on heart monitor 02/2020  ? Type I (juvenile type) diabetes mellitus without mention of complication, not stated as uncontrolled   ? ? ?No past surgical history on file. ? ?Current Medications: ?Current Meds  ?Medication Sig  ? aspirin 81 MG tablet Take 81 mg by mouth daily.  ? chlorthalidone (HYGROTON) 25 MG tablet Take 1 tablet (25 mg total) by mouth daily.  ? diltiazem (CARDIZEM CD) 300 MG 24 hr capsule Take 1 capsule (300 mg total) by mouth daily.  ? insulin lispro (HUMALOG) 100 UNIT/ML injection Inject 30 Units into the skin 3 (three) times daily before meals.  ? Multiple Vitamin (MULTIVITAMIN) capsule Take 1 capsule by mouth daily.  ? olmesartan (BENICAR) 40 MG tablet Take 40 mg by mouth daily.  ?  TRESIBA FLEXTOUCH 100 UNIT/ML FlexTouch Pen Inject 25 Units into the skin daily.  ? [DISCONTINUED] atorvastatin (LIPITOR) 10 MG tablet Take 1 tablet (10 mg total) by mouth at bedtime. Please keep upcoming appt in April 2023 with Cardiologist before anymore refills. Thank you Final Attempt  ?  ? ?Allergies:   Metformin and related  ? ?Social History  ? ?Socioeconomic History  ? Marital status: Married  ?  Spouse name: Not on file  ? Number of children: Not on file  ? Years of education: Not on file  ? Highest education level: Not on file  ?Occupational History  ? Not on file  ?Tobacco Use  ? Smoking status: Never  ? Smokeless tobacco: Never  ?Substance and Sexual Activity  ? Alcohol use: Not on file  ? Drug use: Not on file  ? Sexual activity: Not on file  ?Other Topics Concern  ? Not on file  ?Social History Narrative  ? Not on file  ? ?Social Determinants of Health  ? ?Financial Resource Strain: Not on file  ?Food Insecurity: Not on file  ?Transportation Needs: Not on file  ?Physical Activity: Not on file  ?Stress: Not on file  ?Social Connections: Not on file  ?  ? ?Family History:  The patient's  family history includes Cancer - Ovarian in his maternal grandmother; Diabetes in his father and paternal grandmother; Hypertension in  his father and mother.  ? ?ROS:   ?Please see the history of present illness.    ?ROS All other systems reviewed and are negative. ? ? ?PHYSICAL EXAM:   ?VS:  BP 140/80 (BP Location: Left Arm, Patient Position: Sitting, Cuff Size: Normal)   Pulse 73   Ht 5\' 11"  (1.803 m)   Wt 245 lb (111.1 kg)   SpO2 94%   BMI 34.17 kg/m?   ?Physical Exam  ?GEN: Obese, in no acute distress  ?Neck: no JVD, carotid bruits, or masses ?Cardiac:RRR; no murmurs, rubs, or gallops  ?Respiratory:  clear to auscultation bilaterally, normal work of breathing ?GI: soft, nontender, nondistended, + BS ?Ext: without cyanosis, clubbing, or edema, Good distal pulses bilaterally ?Neuro:  Alert and Oriented x 3 ?Psych:  euthymic mood, full affect ? ?Wt Readings from Last 3 Encounters:  ?12/02/21 245 lb (111.1 kg)  ?10/16/20 250 lb (113.4 kg)  ?01/10/20 248 lb (112.5 kg)  ?  ? ? ?Studies/Labs Reviewed:  ? ?EKG:  EKG is  ordered today.  The ekg ordered today demonstrates NSR with nonspecific ST changes. Unchanged ? ?Recent Labs: ?07/28/2021: BUN 16; Creatinine, Ser 1.08; Potassium 3.7; Sodium 143  ? ?Lipid Panel ?No results found for: CHOL, TRIG, HDL, CHOLHDL, VLDL, LDLCALC, LDLDIRECT ? ?Additional studies/ records that were reviewed today include:  ?Monitor 02/20/20 ?Sinus bradycardia to sinus tachycardia. Patient had a min HR of 52 bpm, max HR of 190 bpm, and avg HR of 79 BPM. ?4 Supraventricular Tachycardia runs occurred, the run with the fastest interval lasting 50.4 secs with a max rate of 190 bpm (avg 163 bpm). ?Isolated PACs and PVCs. ?  ?Predominant underlying rhythm was Sinus Rhythm.  ?4 Supraventricular Tachycardia runs occurred, the run with the fastest interval lasting 50.4 secs. ?Patient's symptoms didn't correlate with episodes of SVTs. During patient's triggered events only sinus rhythm was detected.   ?  ? ? ?Risk Assessment/Calculations:   ?  ? ? ? ? ?ASSESSMENT:   ? ?1. PSVT (paroxysmal supraventricular tachycardia) (HCC)   ?2. Hypertension, unspecified type   ?3. Type 1 diabetes mellitus without complication (HCC)   ?4. Hyperlipidemia, unspecified hyperlipidemia type   ? ? ? ?PLAN:  ?In order of problems listed above: ? ?  PSVT-noted to have short bursts of SVT lasting as long as 50 seconds at 190bpm and isolated PVCs and PACs on heart monitor 02/2020 ?-he had to stop the lopressor due to getting coldness in his hand and feet with changes in color that sound like Raynauds vasospastic disorder triggered by beta blocker ?Now on Cardizem CD 240mg  daily-palpitations only when he drinks coffee. ?  ?HTN-BP controlled on exam today ? ?HLD LDL 100 in 09/2021 goal less than 50. Increase lipitor 20 mg daily and f/u FLP in 3  months ? ?  ?  Type 1 DM A1C 6.9 08/2021 ?-followed by PCP ? ?Shared Decision Making/Informed Consent   ?   ? ? ?Medication Adjustments/Labs and Tests Ordered: ?Current medicines are reviewed at length with the patient today.  Concerns regarding medicines are outlined above.  Medication changes, Labs and Tests ordered today are listed in the Patient Instructions below. ?Patient Instructions  ?Medication Instructions:  ?Your physician has recommended you make the following change in your medication:  ? ?INCREASE: Atorvastatin to 20mg  daily ? ?*If you need a refill on your cardiac medications before your next appointment, please call your pharmacy* ? ? ?Lab Work: ?Your physician recommends that you return for a  FASTING lipid profile on 03/03/2022 between 7:15am-4:30pm ? ?If you have labs (blood work) drawn today and your tests are completely normal, you will receive your results only by: ?MyChart Message (if you have MyChart) OR ?A paper copy in the mail ?If you have any lab test that is abnormal or we need to change your treatment, we will call you to review the results. ? ? ?Follow-Up: ?At [facility], you and your health needs are our priority.  As part of our continuing mission to provide you with exceptional heart care, we have created designated Provider Care Teams.  These Care Teams include your primary Cardiologist (physician) and Advanced Practice Providers (APPs -  Physician Assistants and Nurse Practitioners) who all work together to provide you with the care you need, when you need it. ? ?We recommend signing up for the patient portal called "MyChart".  Sign up information is provided on this After Visit Summary.  MyChart is used to connect with patients for Virtual Visits (Telemedicine).  Patients are able to view lab/test results, encounter notes, upcoming appointments, etc.  Non-urgent messages can be sent to your provider as well.   ?To learn more about what you can do with MyChart, go to  [email or web address].   ? ?Your next appointment:   ?1 year(s) ? ?The format for your next appointment:   ?In Person ? ?Provider:   ?Armanda Magic, MD   ? ? ?Important Information About Sugar ? ? ? ? ?   ? ?Signed, ?M

## 2021-12-02 ENCOUNTER — Ambulatory Visit: Payer: 59 | Admitting: Physician Assistant

## 2021-12-02 ENCOUNTER — Encounter: Payer: Self-pay | Admitting: Physician Assistant

## 2021-12-02 VITALS — BP 140/80 | HR 73 | Ht 71.0 in | Wt 245.0 lb

## 2021-12-02 DIAGNOSIS — E785 Hyperlipidemia, unspecified: Secondary | ICD-10-CM

## 2021-12-02 DIAGNOSIS — I471 Supraventricular tachycardia: Secondary | ICD-10-CM | POA: Diagnosis not present

## 2021-12-02 DIAGNOSIS — E109 Type 1 diabetes mellitus without complications: Secondary | ICD-10-CM | POA: Diagnosis not present

## 2021-12-02 DIAGNOSIS — I10 Essential (primary) hypertension: Secondary | ICD-10-CM | POA: Diagnosis not present

## 2021-12-02 MED ORDER — ATORVASTATIN CALCIUM 20 MG PO TABS: 20.0000 mg | ORAL_TABLET | Freq: Every day | ORAL | 3 refills | Status: DC

## 2021-12-02 NOTE — Patient Instructions (Signed)
Medication Instructions:  ?Your physician has recommended you make the following change in your medication:  ? ?INCREASE: Atorvastatin to 20mg  daily ? ?*If you need a refill on your cardiac medications before your next appointment, please call your pharmacy* ? ? ?Lab Work: ?Your physician recommends that you return for a FASTING lipid profile on 03/03/2022 between 7:15am-4:30pm ? ?If you have labs (blood work) drawn today and your tests are completely normal, you will receive your results only by: ?MyChart Message (if you have MyChart) OR ?A paper copy in the mail ?If you have any lab test that is abnormal or we need to change your treatment, we will call you to review the results. ? ? ?Follow-Up: ?At [facility], you and your health needs are our priority.  As part of our continuing mission to provide you with exceptional heart care, we have created designated Provider Care Teams.  These Care Teams include your primary Cardiologist (physician) and Advanced Practice Providers (APPs -  Physician Assistants and Nurse Practitioners) who all work together to provide you with the care you need, when you need it. ? ?We recommend signing up for the patient portal called "MyChart".  Sign up information is provided on this After Visit Summary.  MyChart is used to connect with patients for Virtual Visits (Telemedicine).  Patients are able to view lab/test results, encounter notes, upcoming appointments, etc.  Non-urgent messages can be sent to your provider as well.   ?To learn more about what you can do with MyChart, go to [facility].   ? ?Your next appointment:   ?1 year(s) ? ?The format for your next appointment:   ?In Person ? ?Provider:   ?[email or web address], MD   ? ? ?Important Information About Sugar ? ? ? ? ?  ?

## 2022-03-03 ENCOUNTER — Other Ambulatory Visit: Payer: 59

## 2022-03-03 DIAGNOSIS — E785 Hyperlipidemia, unspecified: Secondary | ICD-10-CM

## 2022-03-04 LAB — LIPID PANEL
Chol/HDL Ratio: 2.6 ratio (ref 0.0–5.0)
Cholesterol, Total: 128 mg/dL (ref 100–199)
HDL: 49 mg/dL (ref >39)
LDL Chol Calc (NIH): 62 mg/dL (ref 0–99)
Triglycerides: 85 mg/dL (ref 0–149)
VLDL Cholesterol Cal: 17 mg/dL (ref 5–40)

## 2022-04-11 ENCOUNTER — Other Ambulatory Visit: Payer: Self-pay | Admitting: Cardiology

## 2022-05-10 ENCOUNTER — Ambulatory Visit (INDEPENDENT_AMBULATORY_CARE_PROVIDER_SITE_OTHER): Payer: 59

## 2022-05-10 ENCOUNTER — Ambulatory Visit
Admission: EM | Admit: 2022-05-10 | Discharge: 2022-05-10 | Disposition: A | Discharge status: Home / Self Care | Payer: 59 | Attending: Internal Medicine | Admitting: Internal Medicine

## 2022-05-10 ENCOUNTER — Encounter: Payer: Self-pay | Admitting: Emergency Medicine

## 2022-05-10 DIAGNOSIS — M25571 Pain in right ankle and joints of right foot: Secondary | ICD-10-CM

## 2022-05-10 DIAGNOSIS — M79671 Pain in right foot: Secondary | ICD-10-CM

## 2022-05-10 DIAGNOSIS — M79605 Pain in left leg: Secondary | ICD-10-CM

## 2022-05-10 DIAGNOSIS — W19XXXA Unspecified fall, initial encounter: Secondary | ICD-10-CM | POA: Diagnosis not present

## 2022-05-10 NOTE — ED Provider Notes (Signed)
[name]    CSN: [id number] Arrival date & time: 05/10/22  1312      History   Chief Complaint Chief Complaint  Patient presents with   Fall   Leg Pain    HPI Jorge Calhoun is a 56 y.o. male.   Patient presents for further evaluation and management of right ankle and foot pain as well as left upper leg pain that started about an hour prior to arrival to urgent care after a fall.  Patient denies hitting head or losing consciousness.  Patient reports that he was walking down steps when his ankle twisted wrong on the step and he fell twisting his foot/ankle on the right leg and landing on his left upper leg.  Patient has not taken any medication for pain. Denies knee pain. He is able to bear weight.  Denies numbness or tingling.   Fall  Leg Pain   Past Medical History:  Diagnosis Date   Hypertension    Nontoxic multinodular goiter    Obesity, unspecified    Plantar fasciitis    PSVT (paroxysmal supraventricular tachycardia) ([place])    noted on heart monitor 02/2020   Type I (juvenile type) diabetes mellitus without mention of complication, not stated as uncontrolled     Patient Active Problem List   Diagnosis Date Noted   HTN (hypertension) 06/30/2021   PSVT (paroxysmal supraventricular tachycardia) ([place])     History reviewed. No pertinent surgical history.     Home Medications    Prior to Admission medications   Medication Sig Start Date End Date Taking? Authorizing Provider  aspirin 81 MG tablet Take 81 mg by mouth daily.    [provider]  atorvastatin (LIPITOR) 20 MG tablet Take 1 tablet (20 mg total) by mouth at bedtime. 12/02/21   Imogene Burn, PA-C  chlorthalidone (HYGROTON) 25 MG tablet Take 1 tablet (25 mg total) by mouth daily. 08/26/21   Sueanne Margarita, MD  diltiazem (CARDIZEM CD) 300 MG 24 hr capsule Take 1 capsule (300 mg total) by mouth daily. 08/15/21   Sueanne Margarita, MD  insulin lispro (HUMALOG) 100 UNIT/ML  injection Inject 30 Units into the skin 3 (three) times daily before meals.    [provider]  Multiple Vitamin (MULTIVITAMIN) capsule Take 1 capsule by mouth daily.    [provider]  olmesartan (BENICAR) 40 MG tablet Take 40 mg by mouth daily. 09/16/20   [provider]  TRESIBA FLEXTOUCH 100 UNIT/ML FlexTouch Pen Inject 25 Units into the skin daily. 01/07/20   [provider]    Family History Family History  Problem Relation Age of Onset   Hypertension Mother    Hypertension Father    Diabetes Father    Cancer - Ovarian Maternal Grandmother    Diabetes Paternal Grandmother     Social History Social History   Tobacco Use   Smoking status: Never   Smokeless tobacco: Never     Allergies   Metformin and related   Review of Systems Review of Systems Per HPI  Physical Exam Triage Vital Signs ED Triage Vitals [05/10/22 1329]  Enc Vitals Group     BP (!) 151/99     Pulse Rate 89     Resp 18     Temp 98.3 F (36.8 C)     Temp src      SpO2 97 %     Weight      Height  Head Circumference      Peak Flow      Pain Score 8     Pain Loc      Pain Edu?      Excl. in GC?    No data found.  Updated Vital Signs BP (!) 151/99   Pulse 89   Temp 98.3 F (36.8 C)   Resp 18   SpO2 97%   Visual Acuity Right Eye Distance:   Left Eye Distance:   Bilateral Distance:    Right Eye Near:   Left Eye Near:    Bilateral Near:     Physical Exam Constitutional:      General: He is not in acute distress.    Appearance: Normal appearance. He is not toxic-appearing or diaphoretic.  HENT:     Head: Normocephalic and atraumatic.  Eyes:     Extraocular Movements: Extraocular movements intact.     Conjunctiva/sclera: Conjunctivae normal.  Pulmonary:     Effort: Pulmonary effort is normal.  Musculoskeletal:     Comments: Tenderness to palpation to proximal foot with associated swelling present to fourth and fifth metatarsals that  extends slightly into right ankle.  No tenderness to other parts of ankle.  Patient has full range of motion of ankle and can wiggle toes.  Capillary refill and pulses normal.  No lacerations, abrasions, discoloration noted.  Tenderness to palpation to anterior thigh about 3 to 4 inches above knee.  Denies any tenderness to knee.  No obvious swelling, discoloration, lacerations, abrasions noted.  Neurological:     General: No focal deficit present.     Mental Status: He is alert and oriented to person, place, and time. Mental status is at baseline.  Psychiatric:        Mood and Affect: Mood normal.        Behavior: Behavior normal.        Thought Content: Thought content normal.        Judgment: Judgment normal.      UC Treatments / Results  Labs (all labs ordered are listed, but only abnormal results are displayed) Labs Reviewed - No data to display  EKG   Radiology DG Femur Min 2 Views Left  Result Date: 05/10/2022 CLINICAL DATA:  Pain after fall EXAM: LEFT FEMUR 2 VIEWS COMPARISON:  None Available. FINDINGS: There is no evidence of fracture or other focal bone lesions. Soft tissues are unremarkable. IMPRESSION: Negative. Electronically Signed   By: Gerome Sam III M.D.   On: 05/10/2022 14:57   DG Ankle Complete Right  Result Date: 05/10/2022 CLINICAL DATA:  Pain after fall EXAM: RIGHT ANKLE - COMPLETE 3+ VIEW COMPARISON:  None Available. FINDINGS: There is no evidence of fracture, dislocation, or joint effusion. There is no evidence of arthropathy or other focal bone abnormality. Soft tissues are unremarkable. IMPRESSION: Negative. Electronically Signed   By: Gerome Sam III M.D.   On: 05/10/2022 14:56   DG Foot Complete Right  Result Date: 05/10/2022 CLINICAL DATA:  Pain after fall. EXAM: RIGHT FOOT COMPLETE - 3+ VIEW COMPARISON:  None Available. FINDINGS: There is no evidence of fracture or dislocation. There is no evidence of arthropathy or other focal bone  abnormality. Soft tissues are unremarkable. IMPRESSION: Negative. Electronically Signed   By: Gerome Sam III M.D.   On: 05/10/2022 14:55    Procedures Procedures (including critical care time)  Medications Ordered in UC Medications - No data to display  Initial Impression / Assessment and Plan / UC  Course  I have reviewed the triage vital signs and the nursing notes.  Pertinent labs & imaging results that were available during my care of the patient were reviewed by me and considered in my medical decision making (see chart for details).     All x-rays are negative for any acute bony abnormality.  Suspect muscular strain/injury and contusion related to mechanism of injury.  No concern for muscular tear.  Ace wrap applied to right lower ankle/foot prior to discharge by clinical staff.  Discussed supportive care, ice application, elevation of extremity, over-the-counter pain relievers that are safe for patient.  Patient advised to follow-up with orthopedist for further evaluation and management if symptoms persist or worsen.  Patient reports that he already has an established orthopedics at [facility] orth[facility].  Discussed return precautions.  Patient verbalized understanding and was agreeable with plan. Final Clinical Impressions(s) / UC Diagnoses   Final diagnoses:  Fall, initial encounter  Acute right ankle pain  Right foot pain  Left leg pain     Discharge Instructions      Your x-rays are normal.  Suspect sprain.  Ace wrap has been applied to right lower extremity.  Please do not sleep in this.  Recommend elevation of extremity and ice application as well.  Follow-up with orthopedist if pain persist or worsens.     ED Prescriptions   None    PDMP not reviewed this encounter.   Gustavus Bryant, [place] 05/10/22 [phone]

## 2022-05-10 NOTE — ED Triage Notes (Addendum)
Pt is present today with left leg pain from a fall. Pt states that he twisted his ankle the wrong way and fell down the steps. Pt states that the also feels pain and swelling in the right foot. Pt denies LOC or hitting head.

## 2022-05-10 NOTE — Discharge Instructions (Signed)
Your x-rays are normal.  Suspect sprain.  Ace wrap has been applied to right lower extremity.  Please do not sleep in this.  Recommend elevation of extremity and ice application as well.  Follow-up with orthopedist if pain persist or worsens.

## 2022-05-15 ENCOUNTER — Other Ambulatory Visit: Payer: Self-pay | Admitting: Cardiology

## 2022-05-18 ENCOUNTER — Other Ambulatory Visit: Payer: Self-pay

## 2022-07-30 ENCOUNTER — Other Ambulatory Visit: Payer: Self-pay | Admitting: Cardiology

## 2022-09-19 ENCOUNTER — Other Ambulatory Visit: Payer: 59

## 2022-11-08 ENCOUNTER — Other Ambulatory Visit: Payer: Self-pay | Admitting: Cardiology

## 2022-11-09 ENCOUNTER — Other Ambulatory Visit: Payer: Self-pay | Admitting: Cardiology

## 2022-12-05 ENCOUNTER — Other Ambulatory Visit: Payer: Self-pay | Admitting: Physician Assistant

## 2023-01-12 NOTE — Progress Notes (Signed)
Cardiology Office Note    Date:  01/14/2023   ID:  Jorge Calhoun, DOB Feb 26, 1966, MRN [id number]  PCP:  Tally Joe, MD  Cardiologist:  Armanda Magic, MD  Electrophysiologist:  None   Chief Complaint: palpitations  History of Present Illness:   Jorge Calhoun is a 57 y.o. male with history of PSVT, HTN, DM (patient states type 1.5), hyperlipidemia, obesity, OSA on CPAP through [place] who is seen for follow-up. Monitor 02/2020 showed 52-190, avg 79bpm, 4 runs SVT max rate 190bpm longest lasting 50 sec, isolated PACs, PVCs. He was previously on BB but stopped due to Raynaud's phenomena and amlodipine changed to diltiazem. No prior echo.   He reports that in April his palpitations began flaring up after drinking a beet supplement but have intermittently been occurring even after he stopped drinking this. He reports presence/absence of CPAP had not really made a consistent difference. They happen every day at various times, lasting a few seconds, no sustained tachy-palpitations. He reports he is now on the diltiazem 360mg  dose and it has not really made a difference in the frequency (started about a week ago up from 300mg ). Sometimes he has a vague awareness of fullness in his chest but no overt chest pain. This is not associated with palpitations or exertion. No dyspnea or syncope. EKG shows NSR.   Labwork independently reviewed: 02/2022 LDL 62, trig 85 07/2021 K 3.7, Cr 1.08 2020 Hgb 14.4  Past History   Past Medical History:  Diagnosis Date   Hypertension    Nontoxic multinodular goiter    Obesity, unspecified    Plantar fasciitis    PSVT (paroxysmal supraventricular tachycardia)    noted on heart monitor 02/2020   Type I (juvenile type) diabetes mellitus without mention of complication, not stated as uncontrolled     History reviewed. No pertinent surgical history.  Current Medications: Current Meds  Medication Sig   aspirin 81 MG tablet Take 81 mg by mouth daily.    atorvastatin (LIPITOR) 10 MG tablet TAKE 1 TABLET BY MOUTH EVERY DAY   chlorthalidone (HYGROTON) 25 MG tablet Take 1 tablet (25 mg total) by mouth daily.   cyclobenzaprine (FLEXERIL) 5 MG tablet Take 5-10 mg by mouth at bedtime as needed for muscle spasms.   empagliflozin (JARDIANCE) 25 MG TABS tablet 12.5 mg daily.   meloxicam (MOBIC) 15 MG tablet Take 15 mg by mouth as needed for pain.   Multiple Vitamin (MULTIVITAMIN) capsule Take 1 capsule by mouth daily.   olmesartan (BENICAR) 40 MG tablet Take 40 mg by mouth daily.   potassium citrate (UROCIT-K) 10 MEQ (1080 MG) SR tablet 3 (three) times a week.   TRESIBA FLEXTOUCH 100 UNIT/ML FlexTouch Pen Inject 25 Units into the skin daily.   Diltiazem CD 360mg  daily TAKE 1 CAPSULE BY MOUTH EVERY DAY     Allergies:   Metformin and related   Social History   Socioeconomic History   Marital status: Married    Spouse name: Not on file   Number of children: Not on file   Years of education: Not on file   Highest education level: Not on file  Occupational History   Not on file  Tobacco Use   Smoking status: Never   Smokeless tobacco: Never  Substance and Sexual Activity   Alcohol use: Not on file   Drug use: Not on file   Sexual activity: Not on file  Other Topics Concern   Not on file  Social History  Narrative   Not on file   Social Determinants of Health   Financial Resource Strain: Not on file  Food Insecurity: Not on file  Transportation Needs: Not on file  Physical Activity: Not on file  Stress: Not on file  Social Connections: Not on file     Family History:  The patient's family history includes Cancer - Ovarian in his maternal grandmother; Diabetes in his father and paternal grandmother; Hypertension in his father and mother.  ROS:   Please see the history of present illness.   All other systems are reviewed and otherwise negative.    EKG(s)/Additional Testing   EKG:  EKG is ordered today, personally reviewed,  demonstrating NSR 85bpm, right axis deviation, nonspecific TW changess. He had previous inferior TWI but seems to be more notable V3-V6  CV Studies: Cardiac studies reviewed are outlined and summarized above. Otherwise please see EMR for full report.  Recent Labs: No results found for requested labs within last 365 days.  Recent Lipid Panel    Component Value Date/Time   CHOL 128 03/03/2022 0746   TRIG 85 03/03/2022 0746   HDL 49 03/03/2022 0746   CHOLHDL 2.6 03/03/2022 0746   LDLCALC 62 03/03/2022 0746    PHYSICAL EXAM:    VS:  BP (!) 120/90   Pulse 85   Ht 5\' 11"  (1.803 m)   Wt 230 lb (104.3 kg)   SpO2 94%   BMI 32.08 kg/m   BMI: Body mass index is 32.08 kg/m.  GEN: Well nourished, well developed male in no acute distress HEENT: normocephalic, atraumatic Neck: no JVD, carotid bruits, or masses Cardiac: RRR; no murmurs, rubs, or gallops, no edema  Respiratory:  clear to auscultation bilaterally, normal work of breathing GI: soft, nontender, nondistended, + BS MS: no deformity or atrophy Skin: warm and dry, no rash Neuro:  Alert and Oriented x 3, Strength and sensation are intact, follows commands Psych: euthymic mood, full affect  Wt Readings from Last 3 Encounters:  01/14/23 230 lb (104.3 kg)  12/02/21 245 lb (111.1 kg)  10/16/20 250 lb (113.4 kg)     ASSESSMENT & PLAN:   1. Palpitations with h/o PSVT, PACs, PVCs - by description sounds less like PSVT and more like ectopy. EKG and exam c/w NSR today. Already went up on diltiazem a week ago to 360mg  daily without improvement. It sounds like at one point he may have even trialed a ?400mg  dose. Obtain 14 day Zio and echocardiogram. Will re-introduce trial of Toprol 25mg  once daily. If he does not tolerate and Zio shows significant arrhythmias may need EP referral. Check basic labs today   2. Abnormal EKG - progressive TWI seen across precordial leads; inferior TWI appears similar. He describes a strange feeling of  fullness at times that is atypical, no classic anginal symptoms. However, given cardiac risk factors, recommend echo and coronary CTA. Since he is on diltiazem 360mg  (took this AM) and being started on Toprol 25mg  daily with HR 85bpm, I discussed what to do with his pre-CT metoprolol with cardiac CT RN navigator team. They advised we go ahead and send in two 50mg  Lopressor tablets to have on hand, with recommendation for patient to track his HR at home for now - when they call him to go over instructions, they'll review HR trends to help decide what dose to take the AM of the CT.  3. Essential HTN - SBP wnl, diastolic reading borderline up. Did not recheck given plan to  add metoprolol. Can follow with this addition. Update labs today.  4. Hyperlipidemia - came fasting. Recheck CMET/lipids today.   Disposition: F/u with me after above testing.   Medication Adjustments/Labs and Tests Ordered: Current medicines are reviewed at length with the patient today.  Concerns regarding medicines are outlined above. Medication changes, Labs and Tests ordered today are summarized above and listed in the Patient Instructions accessible in Encounters.   Signed, Laurann Montana, PA-C  01/14/2023 10:59 AM    [facility] Phone: [phone]; Fax: [phone]

## 2023-01-14 ENCOUNTER — Encounter: Payer: Self-pay | Admitting: Physician Assistant

## 2023-01-14 ENCOUNTER — Ambulatory Visit (INDEPENDENT_AMBULATORY_CARE_PROVIDER_SITE_OTHER): Payer: 59

## 2023-01-14 ENCOUNTER — Ambulatory Visit: Payer: 59 | Attending: Physician Assistant | Admitting: Physician Assistant

## 2023-01-14 VITALS — BP 120/90 | HR 85 | Ht 71.0 in | Wt 230.0 lb

## 2023-01-14 DIAGNOSIS — I471 Supraventricular tachycardia, unspecified: Secondary | ICD-10-CM

## 2023-01-14 DIAGNOSIS — E785 Hyperlipidemia, unspecified: Secondary | ICD-10-CM

## 2023-01-14 DIAGNOSIS — I491 Atrial premature depolarization: Secondary | ICD-10-CM | POA: Diagnosis not present

## 2023-01-14 DIAGNOSIS — R002 Palpitations: Secondary | ICD-10-CM

## 2023-01-14 DIAGNOSIS — I493 Ventricular premature depolarization: Secondary | ICD-10-CM | POA: Diagnosis not present

## 2023-01-14 DIAGNOSIS — I10 Essential (primary) hypertension: Secondary | ICD-10-CM | POA: Diagnosis not present

## 2023-01-14 DIAGNOSIS — R9431 Abnormal electrocardiogram [ECG] [EKG]: Secondary | ICD-10-CM

## 2023-01-14 MED ORDER — METOPROLOL SUCCINATE ER 25 MG PO TB24: 25.0000 mg | ORAL_TABLET | Freq: Every day | ORAL | 3 refills | Status: DC

## 2023-01-14 MED ORDER — DILTIAZEM HCL ER COATED BEADS 360 MG PO CP24: 360.0000 mg | ORAL_CAPSULE | Freq: Every day | ORAL | Status: DC

## 2023-01-14 MED ORDER — METOPROLOL TARTRATE 50 MG PO TABS: 50.0000 mg | ORAL_TABLET | Freq: Once | ORAL | 0 refills | Status: DC

## 2023-01-14 NOTE — Patient Instructions (Addendum)
Medication Instructions:  Your physician has recommended you make the following change in your medication:   1) START metoprolol succinate (Toprol XL) 25mg  daily  *If you need a refill on your cardiac medications before your next appointment, please call your pharmacy*  Lab Work: TODAY: CMET, Magnesium, CBC, TSH, Lipids If you have labs (blood work) drawn today and your tests are completely normal, you will receive your results only by: MyChart Message (if you have MyChart) OR A paper copy in the mail If you have any lab test that is abnormal or we need to change your treatment, we will call you to review the results.  Testing/Procedures: Your physician has requested that you have an echocardiogram. Echocardiography is a painless test that uses sound waves to create images of your heart. It provides your doctor with information about the size and shape of your heart and how well your heart's chambers and valves are working. This procedure takes approximately one hour. There are no restrictions for this procedure. Please do NOT wear cologne, perfume, aftershave, or lotions (deodorant is allowed). Please arrive 15 minutes prior to your appointment time.   Your provider has requested that you have a coronary CTA scan performed.  Your physician has requested that you wear a Zio heart monitor for 14 days. This will be mailed to your home with instructions on how to apply the monitor and how to return it when finished. Please allow 2 weeks after returning the heart monitor before our office calls you with the results.   Follow-Up: At [facility], you and your health needs are our priority.  As part of our continuing mission to provide you with exceptional heart care, we have created designated Provider Care Teams.  These Care Teams include your primary Cardiologist (physician) and Advanced Practice Providers (APPs -  Physician Assistants and Nurse Practitioners) who all work together to provide  you with the care you need, when you need it.  Your next appointment:   Schedule to see Dayna after all testing has been completed.  The format for your next appointment:   In Person  Provider:   Ronie Spies, PA-C      Other Instructions Please monitor your heart rate and blood pressure daily prior to your coronary CTA scan and the CT team will call you the day prior to your test to let you know how much of the one-time dose of metoprolol tartrate (Lopressor) to take. You can monitor your heart rate via a pulse oximeter, fitness watch or your blood pressure machine.    Your cardiac CT will be scheduled at:   [facility] [address] [place], [place] [address] [phone]  Please arrive at the [facility] and Children's Entrance (Entrance C2) of [facility] 30 minutes prior to test start time. You can use the FREE valet parking offered at entrance C (encouraged to control the heart rate for the test)  Proceed to the [facility] Radiology Department (first floor) to check-in and test prep.  All radiology patients and guests should use entrance C2 at [facility], accessed from [facility], even though the hospital's physical address listed is [address]     Please follow these instructions carefully (unless otherwise directed):  Hold all erectile dysfunction medications at least 3 days (72 hrs) prior to test. (Ie viagra, cialis, sildenafil, tadalafil, etc) We will administer nitroglycerin during this exam.   On the Night Before the Test: Be sure to  Drink plenty of water. Do not consume any caffeinated/decaffeinated beverages or chocolate 12 hours prior to your test. Do not take any antihistamines 12 hours prior to your test.  On the Day of the Test: Drink plenty of water until 1 hour prior to the test. Do not eat any food 1 hour prior to test. You may take your regular medications prior to the test.  Take metoprolol  (Lopressor) two hours prior to test. CT team will call you the day before to let you know how much you should take.      After the Test: Drink plenty of water. After receiving IV contrast, you may experience a mild flushed feeling. This is normal. On occasion, you may experience a mild rash up to 24 hours after the test. This is not dangerous. If this occurs, you can take Benadryl 25 mg and increase your fluid intake. If you experience trouble breathing, this can be serious. If it is severe call 911 IMMEDIATELY. If it is mild, please call our office. If you take any of these medications: Glipizide/Metformin, Avandament, Glucavance, please do not take 48 hours after completing test unless otherwise instructed.  We will call to schedule your test 2-4 weeks out understanding that some insurance companies will need an authorization prior to the service being performed.   For non-scheduling related questions, please contact the cardiac imaging nurse navigator should you have any questions/concerns: Rockwell Alexandria, Cardiac Imaging Nurse Navigator Larey Brick, Cardiac Imaging Nurse Navigator [facility] Direct Office Dial: [phone]   For scheduling needs, including cancellations and rescheduling, please call [place], [phone].    ZIO XT- Long Term Monitor Instructions     Your physician has requested you wear a ZIO patch monitor for 14 days.  This is a single patch monitor. [employer] supplies one patch monitor per enrollment. Additional  stickers are not available. Please do not apply patch if you will be having a Nuclear Stress Test,  Echocardiogram, Cardiac CT, MRI, or Chest Xray during the period you would be wearing the  monitor. The patch cannot be worn during these tests. You cannot remove and re-apply the  ZIO XT patch monitor.  Your ZIO patch monitor will be mailed 3 day USPS to your address on file. It may take 3-5 days  to receive your monitor after  you have been enrolled.  Once you have received your monitor, please review the enclosed instructions. Your monitor  has already been registered assigning a specific monitor serial # to you.     Billing and Patient Assistance Program Information     We have supplied [employer] with any of your insurance information on file for billing purposes.  [employer] offers a sliding scale Patient Assistance Program for patients that do not have  insurance, or whose insurance does not completely cover the cost of the ZIO monitor.  You must apply for the Patient Assistance Program to qualify for this discounted rate.  To apply, please call [employer] at [phone], select option 4, select option 2, ask to apply for  Patient Assistance Program. Meredeth Ide will ask your household income, and how many people  are in your household. They will quote your out-of-pocket cost based on that information.  [employer] will also be able to set up a 56-month, interest-free payment plan if needed.     Applying the monitor     Shave hair from upper left chest.  Hold abrader disc by orange tab. Rub abrader in 40 strokes  over the upper left chest as  indicated in your monitor instructions.  Clean area with 4 enclosed alcohol pads. Let dry.  Apply patch as indicated in monitor instructions. Patch will be placed under collarbone on left  side of chest with arrow pointing upward.  Rub patch adhesive wings for 2 minutes. Remove white label marked "1". Remove the white  label marked "2". Rub patch adhesive wings for 2 additional minutes.  While looking in a mirror, press and release button in center of patch. A small green light will  flash 3-4 times. This will be your only indicator that the monitor has been turned on.  Do not shower for the first 24 hours. You may shower after the first 24 hours.  Press the button if you feel a symptom. You will hear a small click. Record Date, Time and  Symptom in the Patient Logbook.  When you  are ready to remove the patch, follow instructions on the last 2 pages of Patient  Logbook. Stick patch monitor onto the last page of Patient Logbook.  Place Patient Logbook in the blue and white box. Use locking tab on box and tape box closed  securely. The blue and white box has prepaid postage on it. Please place it in the mailbox as  soon as possible. Your physician should have your test results approximately 7 days after the  monitor has been mailed back to [facility].  Call [facility] Customer Care at [phone] if you have questions regarding  your ZIO XT patch monitor. Call them immediately if you see an orange light blinking on your  monitor.  If your monitor falls off in less than 4 days, contact our Monitor department at [phone].  If your monitor becomes loose or falls off after 4 days call [employer] at [phone] for  suggestions on securing your monitor.

## 2023-01-14 NOTE — Progress Notes (Unsigned)
Enrolled for [employer] to mail a ZIO XT long term holter monitor to the patients address on file.   Dr. Turner to read. 

## 2023-01-15 LAB — COMPREHENSIVE METABOLIC PANEL
ALT: 19 IU/L (ref 0–44)
AST: 36 IU/L (ref 0–40)
Albumin/Globulin Ratio: 2 (ref 1.2–2.2)
Albumin: 4.3 g/dL (ref 3.8–4.9)
Alkaline Phosphatase: 74 IU/L (ref 44–121)
BUN/Creatinine Ratio: 16 (ref 9–20)
BUN: 18 mg/dL (ref 6–24)
Bilirubin Total: 0.5 mg/dL (ref 0.0–1.2)
CO2: 26 mmol/L (ref 20–29)
Calcium: 9.3 mg/dL (ref 8.7–10.2)
Chloride: 100 mmol/L (ref 96–106)
Creatinine, Ser: 1.15 mg/dL (ref 0.76–1.27)
Globulin, Total: 2.1 g/dL (ref 1.5–4.5)
Glucose: 98 mg/dL (ref 70–99)
Potassium: 3.2 mmol/L — ABNORMAL LOW (ref 3.5–5.2)
Sodium: 142 mmol/L (ref 134–144)
Total Protein: 6.4 g/dL (ref 6.0–8.5)
eGFR: 74 mL/min/{1.73_m2} (ref >59)

## 2023-01-15 LAB — LIPID PANEL
Chol/HDL Ratio: 3.8 ratio (ref 0.0–5.0)
Cholesterol, Total: 196 mg/dL (ref 100–199)
HDL: 51 mg/dL (ref >39)
LDL Chol Calc (NIH): 133 mg/dL — ABNORMAL HIGH (ref 0–99)
Triglycerides: 67 mg/dL (ref 0–149)
VLDL Cholesterol Cal: 12 mg/dL (ref 5–40)

## 2023-01-15 LAB — CBC
Hematocrit: 44.7 % (ref 37.5–51.0)
Hemoglobin: 14.9 g/dL (ref 13.0–17.7)
MCH: 30.6 pg (ref 26.6–33.0)
MCHC: 33.3 g/dL (ref 31.5–35.7)
MCV: 92 fL (ref 79–97)
Platelets: 306 10*3/uL (ref 150–450)
RBC: 4.87 x10E6/uL (ref 4.14–5.80)
RDW: 12.5 % (ref 11.6–15.4)
WBC: 5.2 10*3/uL (ref 3.4–10.8)

## 2023-01-15 LAB — MAGNESIUM: Magnesium: 2.1 mg/dL (ref 1.6–2.3)

## 2023-01-15 LAB — TSH: TSH: 1.14 u[IU]/mL (ref 0.450–4.500)

## 2023-01-17 DIAGNOSIS — R002 Palpitations: Secondary | ICD-10-CM

## 2023-01-19 DIAGNOSIS — E876 Hypokalemia: Secondary | ICD-10-CM

## 2023-01-19 DIAGNOSIS — E785 Hyperlipidemia, unspecified: Secondary | ICD-10-CM

## 2023-01-19 DIAGNOSIS — Z79899 Other long term (current) drug therapy: Secondary | ICD-10-CM

## 2023-01-19 MED ORDER — POTASSIUM CHLORIDE CRYS ER 20 MEQ PO TBCR: 40.0000 meq | EXTENDED_RELEASE_TABLET | Freq: Every day | ORAL | 3 refills | Status: AC

## 2023-01-21 MED ORDER — ATORVASTATIN CALCIUM 40 MG PO TABS: 40.0000 mg | ORAL_TABLET | Freq: Every day | ORAL | 3 refills | Status: DC

## 2023-01-21 NOTE — Addendum Note (Signed)
Addended by: Lars Mage on: 01/21/2023 02:11 PM   Modules accepted: Orders

## 2023-01-26 ENCOUNTER — Ambulatory Visit: Payer: 59 | Attending: Cardiology

## 2023-01-26 DIAGNOSIS — E876 Hypokalemia: Secondary | ICD-10-CM

## 2023-01-27 LAB — BASIC METABOLIC PANEL
BUN/Creatinine Ratio: 13 (ref 9–20)
BUN: 15 mg/dL (ref 6–24)
CO2: 27 mmol/L (ref 20–29)
Calcium: 9 mg/dL (ref 8.7–10.2)
Chloride: 99 mmol/L (ref 96–106)
Creatinine, Ser: 1.16 mg/dL (ref 0.76–1.27)
Glucose: 143 mg/dL — ABNORMAL HIGH (ref 70–99)
Potassium: 3.9 mmol/L (ref 3.5–5.2)
Sodium: 140 mmol/L (ref 134–144)
eGFR: 73 mL/min/{1.73_m2} (ref >59)

## 2023-02-06 ENCOUNTER — Other Ambulatory Visit: Payer: Self-pay | Admitting: Cardiology

## 2023-02-15 ENCOUNTER — Ambulatory Visit (HOSPITAL_COMMUNITY): Payer: 59 | Attending: Physician Assistant

## 2023-02-15 ENCOUNTER — Encounter (HOSPITAL_COMMUNITY): Payer: Self-pay | Admitting: Physician Assistant

## 2023-02-15 ENCOUNTER — Encounter (HOSPITAL_COMMUNITY): Payer: Self-pay

## 2023-02-19 ENCOUNTER — Telehealth (HOSPITAL_COMMUNITY): Payer: Self-pay | Admitting: *Deleted

## 2023-02-19 NOTE — Telephone Encounter (Signed)
Reaching out to patient to offer assistance regarding upcoming cardiac imaging study; pt verbalizes understanding of appt date/time, parking situation and where to check in, pre-test NPO status and medications ordered, and verified current allergies; name and call back number provided for further questions should they arise Johney Frame RN Navigator Cardiac Imaging [facility] [phone] office [phone] cell  Patient reports HR 78 BP 120s/70s consistently at home since starting metoprolol succinate.   Patient will take 100 mg metoprolol tartrate, hold chlorthalidone AM of test.  Instructions sent to Mychart as well.

## 2023-02-22 ENCOUNTER — Ambulatory Visit (HOSPITAL_COMMUNITY)
Admission: RE | Admit: 2023-02-22 | Discharge: 2023-02-22 | Disposition: A | Discharge status: Home / Self Care | Payer: 59 | Source: Ambulatory Visit | Attending: Physician Assistant | Admitting: Physician Assistant

## 2023-02-22 DIAGNOSIS — R9431 Abnormal electrocardiogram [ECG] [EKG]: Secondary | ICD-10-CM | POA: Insufficient documentation

## 2023-02-22 MED ORDER — NITROGLYCERIN 0.4 MG SL SUBL
0.8000 mg | SUBLINGUAL_TABLET | Freq: Once | SUBLINGUAL | Status: AC
  Administered 2023-02-22: 0.8 mg via SUBLINGUAL

## 2023-02-22 MED ORDER — IOHEXOL 350 MG/ML SOLN
100.0000 mL | Freq: Once | INTRAVENOUS | Status: AC | PRN
  Administered 2023-02-22: 100 mL via INTRAVENOUS

## 2023-02-22 MED ORDER — NITROGLYCERIN 0.4 MG SL SUBL
SUBLINGUAL_TABLET | SUBLINGUAL | Status: AC
  Filled 2023-02-22: qty 2

## 2023-03-18 ENCOUNTER — Ambulatory Visit: Payer: 59 | Attending: Physician Assistant

## 2023-03-18 DIAGNOSIS — E785 Hyperlipidemia, unspecified: Secondary | ICD-10-CM

## 2023-03-18 DIAGNOSIS — Z79899 Other long term (current) drug therapy: Secondary | ICD-10-CM

## 2023-03-18 LAB — LIPID PANEL
Chol/HDL Ratio: 2.8 ratio (ref 0.0–5.0)
Cholesterol, Total: 164 mg/dL (ref 100–199)
HDL: 59 mg/dL (ref >39)
LDL Chol Calc (NIH): 94 mg/dL (ref 0–99)
Triglycerides: 56 mg/dL (ref 0–149)
VLDL Cholesterol Cal: 11 mg/dL (ref 5–40)

## 2023-03-18 LAB — HEPATIC FUNCTION PANEL
ALT: 21 IU/L (ref 0–44)
AST: 16 IU/L (ref 0–40)
Albumin: 4.2 g/dL (ref 3.8–4.9)
Alkaline Phosphatase: 80 IU/L (ref 44–121)
Bilirubin Total: 0.5 mg/dL (ref 0.0–1.2)
Bilirubin, Direct: 0.15 mg/dL (ref 0.00–0.40)
Total Protein: 6.4 g/dL (ref 6.0–8.5)

## 2023-03-19 ENCOUNTER — Telehealth: Payer: Self-pay

## 2023-03-19 DIAGNOSIS — E785 Hyperlipidemia, unspecified: Secondary | ICD-10-CM

## 2023-03-19 NOTE — Telephone Encounter (Signed)
Spoke with patient about recent lab work - patient would like to try lifestyle modifications (diet/exercise) prior to increasing atorvastatin. Scheduled repeat labs on 09/20/23. No further needs at this time

## 2023-03-19 NOTE — Telephone Encounter (Signed)
-----   Message from Laurann Montana sent at 03/19/2023  7:46 AM EDT ----- Please let pt know that LDL much improved but not yet to goal given mild plaque seen on cor CT - goal LDL <70. Two options: 1) work on lifestyle modification with diet/exercise and recheck fasting lipid panel with LFTs in 6 months Or if he already feels that's optimized, 2) increase atorvastatin to 80mg  daily with recheck fasting lipid panel/LFTs in 3 months Patient was supposed to have f/u after all testing completed, please offer to schedule. Thanks!

## 2023-09-20 ENCOUNTER — Other Ambulatory Visit: Payer: 59

## 2023-10-12 ENCOUNTER — Other Ambulatory Visit: Payer: Self-pay | Admitting: Cardiology

## 2023-10-12 MED ORDER — DILTIAZEM HCL ER COATED BEADS 360 MG PO CP24: 360.0000 mg | ORAL_CAPSULE | Freq: Every day | ORAL | 0 refills | Status: DC

## 2024-01-11 ENCOUNTER — Other Ambulatory Visit: Payer: Self-pay | Admitting: Physician Assistant

## 2024-01-19 ENCOUNTER — Other Ambulatory Visit: Payer: Self-pay | Admitting: Physician Assistant

## 2024-02-06 ENCOUNTER — Other Ambulatory Visit: Payer: Self-pay | Admitting: Physician Assistant

## 2024-02-13 ENCOUNTER — Other Ambulatory Visit: Payer: Self-pay | Admitting: Physician Assistant

## 2024-02-16 ENCOUNTER — Telehealth: Payer: Self-pay | Admitting: Cardiology

## 2024-02-16 DIAGNOSIS — R002 Palpitations: Secondary | ICD-10-CM

## 2024-02-16 DIAGNOSIS — I471 Supraventricular tachycardia, unspecified: Secondary | ICD-10-CM

## 2024-02-16 NOTE — Telephone Encounter (Signed)
 Call to patient to discuss concerns. Patient states his palpitations have been back for 1-2 months. He says they only come on for a second, usually when he is at rest, but he says he feels he is experiencing this 10 times a day or more. He verifies he is taking his cardizem  and toprol  in the morning and he is taking them together. He denies CP, SOB, palpitations and leg swelling.   Patient is due for his yearly f/u appt so I scheduled him for 03/16/24 and also discussed ED precautions. Patient agrees to go to ED if he develops chest pain or SOB that is not resolved easily with rest.

## 2024-02-16 NOTE — Telephone Encounter (Signed)
 Spoke to patient that he started back with having heart palpitations. Pt declines chest pain, SHOB, dizziness, and swelling. Pt reports that he is taking all meds as listed, except he has a prescription for diltiazem  CD 300 mg and not 360 mg. Blood pressures over the last several weeks has been running as follows: 144/80,131/84,119/79,122/83,123/84,144/89,134/89,113/73. Heart rates are no higher than 83 and have generally been running in the 70's.

## 2024-02-16 NOTE — Telephone Encounter (Signed)
 Message sent via Pt scheduling:    I've been having them for about a month, I've had them for a few years but I was given medicine to help and it sis but it's starting back again.    Suzen GORMAN Frederik Lamar E McDonald2 days ago    Good afternoon,   Please answer the following questions for me:   How long have you had palpitations/irregular heart rate/Afib? Are you having the symptoms now?    Are you currently experiencing lightheadedness, shortness of breath or chest pain?    Do you have a history of afib (atrial fibrillation) or irregular heart rhythm?    Have you checked your blood pressure or heart rate? (document readings if available):    Are you experiencing any other symptoms?

## 2024-02-21 NOTE — Telephone Encounter (Signed)
 Call to patient to share Dr. Milan advice that he do a 3 day monitor while he awaits appt with Dr. Shlomo on 03/16/24. Patient verbalizes understanding and agrees to plan. Order placed.

## 2024-02-21 NOTE — Addendum Note (Signed)
 Addended by: Scotland Korver L on: 02/21/2024 04:35 PM   Modules accepted: Orders

## 2024-02-22 ENCOUNTER — Ambulatory Visit: Attending: Internal Medicine

## 2024-02-22 DIAGNOSIS — R002 Palpitations: Secondary | ICD-10-CM

## 2024-02-22 NOTE — Progress Notes (Unsigned)
 Enrolled patient for a 3 day Zio XT monitor to be mailed to patients home

## 2024-03-10 ENCOUNTER — Other Ambulatory Visit: Payer: Self-pay | Admitting: Cardiology

## 2024-03-10 ENCOUNTER — Other Ambulatory Visit: Payer: Self-pay | Admitting: Physician Assistant

## 2024-03-16 ENCOUNTER — Ambulatory Visit: Attending: Cardiology | Admitting: Cardiology

## 2024-03-16 ENCOUNTER — Encounter: Payer: Self-pay | Admitting: Cardiology

## 2024-03-16 VITALS — BP 131/85 | HR 70 | Ht 71.0 in | Wt 236.4 lb

## 2024-03-16 DIAGNOSIS — E785 Hyperlipidemia, unspecified: Secondary | ICD-10-CM

## 2024-03-16 DIAGNOSIS — Z79899 Other long term (current) drug therapy: Secondary | ICD-10-CM

## 2024-03-16 DIAGNOSIS — I471 Supraventricular tachycardia, unspecified: Secondary | ICD-10-CM

## 2024-03-16 DIAGNOSIS — I10 Essential (primary) hypertension: Secondary | ICD-10-CM | POA: Diagnosis not present

## 2024-03-16 NOTE — Patient Instructions (Signed)
 Medication Instructions:  Your physician recommends that you continue on your current medications as directed. Please refer to the Current Medication list given to you today.  *If you need a refill on your cardiac medications before your next appointment, please call your pharmacy*  Lab Work: Please complete a FASTING lipid panel and an ALT in our first floor lab before your leave today.   If you have labs (blood work) drawn today and your tests are completely normal, you will receive your results only by: MyChart Message (if you have MyChart) OR A paper copy in the mail If you have any lab test that is abnormal or we need to change your treatment, we will call you to review the results.  Testing/Procedures: None.  Follow-Up: At [facility], you and your health needs are our priority.  As part of our continuing mission to provide you with exceptional heart care, our providers are all part of one team.  This team includes your primary Cardiologist (physician) and Advanced Practice Providers or APPs (Physician Assistants and Nurse Practitioners) who all work together to provide you with the care you need, when you need it.  Your next appointment:   1 year(s)  Provider:   Wilbert Bihari, MD

## 2024-03-16 NOTE — Progress Notes (Signed)
 Cardiology Consult Note    Date:  03/16/2024   ID:  Jorge Calhoun, DOB 10-29-1965, MRN [id number]  PCP:  Jorge Lenis, MD  Cardiologist:  Jorge Bihari, MD   Chief Complaint  Patient presents with   Follow-up    PSVT and hypertension    History of Present Illness:  Jorge Calhoun is a 58 y.o. male  with a hx of nontoxic multinodular goiter, HTN, Obesity and Type 1 DM who recently saw his PCP and palpitations.   Event monitor done 02/2020 showed NSR with with short bursts of SVT lasting as long as 50 seconds at 190bpm and isolated PVCs and PACs.  He was started on Lopressor  25mg  BID but had vasospastic episodes c/w Raynauds and it was changed to Cardizem .   Patient is here for follow-up today and is doing well.  He denies any chest pain or pressure, shortness of breath, PND, orthopnea, lower extreme edema, dizziness or syncope.  He continues to have palpitations on a daily basis and are exacerbated by eating.  He says that it feels like his heart is out of rhythm.  He recently wore a heart monitor but has not been read yet.   Past Medical History:  Diagnosis Date   Hypertension    Nontoxic multinodular goiter    Obesity, unspecified    Plantar fasciitis    PSVT (paroxysmal supraventricular tachycardia) (HCC)    noted on heart monitor 02/2020   Type I (juvenile type) diabetes mellitus without mention of complication, not stated as uncontrolled     History reviewed. No pertinent surgical history.  Current Medications: Current Meds  Medication Sig   aspirin 81 MG tablet Take 81 mg by mouth daily.   atorvastatin  (LIPITOR) 40 MG tablet TAKE 1 TABLET BY MOUTH EVERY DAY   chlorthalidone  (HYGROTON ) 25 MG tablet TAKE 1 TABLET (25 MG TOTAL) BY MOUTH DAILY. PATIENT MUST SCHEDULE ANNUAL APPOINTMENT FOR FUTURE REFILLS FIRST ATTEMPT   cyclobenzaprine (FLEXERIL) 5 MG tablet Take 5-10 mg by mouth at bedtime as needed for muscle spasms.   diltiazem  (CARDIZEM  CD) 360 MG 24 hr capsule Take  1 capsule (360 mg total) by mouth daily.   empagliflozin (JARDIANCE) 25 MG TABS tablet 12.5 mg daily.   insulin lispro (HUMALOG) 100 UNIT/ML injection Inject 30 Units into the skin 3 (three) times daily before meals.   meloxicam (MOBIC) 15 MG tablet Take 15 mg by mouth as needed for pain.   metoprolol  succinate (TOPROL -XL) 25 MG 24 hr tablet TAKE 1 TABLET (25 MG TOTAL) BY MOUTH DAILY.   metoprolol  tartrate (LOPRESSOR ) 50 MG tablet Take 1 tablet (50 mg total) by mouth once for 1 dose. Take 2 hours prior to CT scan, a nurse will call you the day before to let you know how many tablets to take.   Multiple Vitamin (MULTIVITAMIN) capsule Take 1 capsule by mouth daily.   olmesartan (BENICAR) 40 MG tablet Take 40 mg by mouth daily.   potassium chloride  SA (KLOR-CON  M20) 20 MEQ tablet Take 2 tablets (40 mEq total) by mouth daily.   TRESIBA FLEXTOUCH 100 UNIT/ML FlexTouch Pen Inject 25 Units into the skin daily.    Allergies:   Metformin and related   Social History   Socioeconomic History   Marital status: Married    Spouse name: Not on file   Number of children: Not on file   Years of education: Not on file   Highest education level: Not on file  Occupational History  Not on file  Tobacco Use   Smoking status: Never   Smokeless tobacco: Never  Substance and Sexual Activity   Alcohol use: Not on file   Drug use: Not on file   Sexual activity: Not on file  Other Topics Concern   Not on file  Social History Narrative   Not on file   Social Drivers of Health   Financial Resource Strain: Not on file  Food Insecurity: Not on file  Transportation Needs: Not on file  Physical Activity: Not on file  Stress: Not on file  Social Connections: Not on file     Family History:  The patient's family history includes Cancer - Ovarian in his maternal grandmother; Diabetes in his father and paternal grandmother; Hypertension in his father and mother.   ROS:   Please see the history of present  illness.    ROS All other systems reviewed and are negative.      No data to display           PHYSICAL EXAM:   VS:  BP 131/85   Pulse 70   Ht 5' 11 (1.803 m)   Wt 236 lb 6.4 oz (107.2 kg)   SpO2 94%   BMI 32.97 kg/m    GEN: Well nourished, well developed in no acute distress HEENT: Normal NECK: No JVD; No carotid bruits LYMPHATICS: No lymphadenopathy CARDIAC:RRR, no murmurs, rubs, gallops RESPIRATORY:  Clear to auscultation without rales, wheezing or rhonchi  ABDOMEN: Soft, non-tender, non-distended MUSCULOSKELETAL:  No edema; No deformity  SKIN: Warm and dry NEUROLOGIC:  Alert and oriented x 3 PSYCHIATRIC:  Normal affect  Wt Readings from Last 3 Encounters:  03/16/24 236 lb 6.4 oz (107.2 kg)  01/14/23 230 lb (104.3 kg)  12/02/21 245 lb (111.1 kg)      Studies/Labs Reviewed:   EKG Interpretation Date/Time:  Thursday March 16 2024 14:05:51 EDT Ventricular Rate:  70 PR Interval:  156 QRS Duration:  76 QT Interval:  380 QTC Calculation: 410 R Axis:   -13  Text Interpretation: Normal sinus rhythm Septal infarct , age undetermined When compared with ECG of 02-Mar-1999 09:21, Vent. rate has decreased BY  68 BPM Nonspecific T wave abnormality, worse in Inferior leads T wave amplitude has decreased in Anterior leads Confirmed by Shlomo Corning [id number]) on 03/16/2024 2:52:34 PM    Recent Labs: 03/18/2023: ALT 21   Lipid Panel    Component Value Date/Time   CHOL 164 03/18/2023 0926   TRIG 56 03/18/2023 0926   HDL 59 03/18/2023 0926   CHOLHDL 2.8 03/18/2023 0926   LDLCALC 94 03/18/2023 0926    Additional studies/ records that were reviewed today include:  OV notes from PCP  ASSESSMENT:    1. PSVT (paroxysmal supraventricular tachycardia) (HCC)   2. Essential hypertension      PLAN:  In order of problems listed above:  PSVT -noted to have short bursts of SVT lasting as long as 50 seconds at 190bpm and isolated PVCs and PACs on heart monitor  02/2020 -Initially placed on beta-blocker therapy but has what appeared to be some Raynaud's symptoms with changes in color of extremities that sound like Raynauds vasospastic disorder triggered by beta blocker and changed to Toprol  and eventually Cardizem  was added -He has had some increase in palpitations and recently wore an event monitor but I do not have the results yet -Continue Toprol -XL 25 mg daily and Cardizem  CD 360mg  daily with as needed refills  HTN  -BP controlled  on exam today -Continue chlorthalidone  25 mg daily, Cardizem  CD 360 mg daily, Toprol -XL 25 mg daily and Benicar 40 mg daily with as needed refills -I have personally reviewed and interpreted outside labs performed by patient's PCP which showed serum creatinine 0.91 on 01/14/2024  ASCAD HLD -coronary CTA 2024 showed cor Cal score of 0 with <25% soft plaque in the LAD and RCA -denies any anginal sx -Continue ASA 81mg  daily, Atorvastatin  40mg  daily with PRN refills -repeat FLP and ALT    Medication Adjustments/Labs and Tests Ordered: Current medicines are reviewed at length with the patient today.  Concerns regarding medicines are outlined above.  Medication changes, Labs and Tests ordered today are listed in the Patient Instructions below.  There are no Patient Instructions on file for this visit.   Signed, Jorge Bihari, MD  03/16/2024 2:55 PM    [facility] [address] [place], [place], [place]  [address] Phone: [phone]; Fax: [phone]

## 2024-03-16 NOTE — Addendum Note (Signed)
 Addended by: JANIT GENI CROME on: 03/16/2024 03:07 PM   Modules accepted: Orders

## 2024-03-17 ENCOUNTER — Ambulatory Visit: Payer: Self-pay

## 2024-03-17 LAB — ALT: ALT: 15 IU/L (ref 0–44)

## 2024-03-17 LAB — LIPID PANEL
Chol/HDL Ratio: 3.1 ratio (ref 0.0–5.0)
Cholesterol, Total: 163 mg/dL (ref 100–199)
HDL: 52 mg/dL (ref >39)
LDL Chol Calc (NIH): 101 mg/dL — ABNORMAL HIGH (ref 0–99)
Triglycerides: 49 mg/dL (ref 0–149)
VLDL Cholesterol Cal: 10 mg/dL (ref 5–40)

## 2024-03-29 MED ORDER — ATORVASTATIN CALCIUM 40 MG PO TABS: 40.0000 mg | ORAL_TABLET | Freq: Every day | ORAL | 3 refills | Status: AC

## 2024-04-02 ENCOUNTER — Ambulatory Visit: Payer: Self-pay | Admitting: Cardiology

## 2024-04-02 DIAGNOSIS — R002 Palpitations: Secondary | ICD-10-CM

## 2024-04-05 ENCOUNTER — Telehealth: Payer: Self-pay

## 2024-04-05 NOTE — Telephone Encounter (Signed)
 See C message 04/05/24

## 2024-04-06 ENCOUNTER — Other Ambulatory Visit: Payer: Self-pay | Admitting: Physician Assistant

## 2024-04-07 ENCOUNTER — Telehealth: Payer: Self-pay

## 2024-04-07 NOTE — Telephone Encounter (Signed)
 Patient advised via [facility] on 8/18 that Heart monitor showed rare extra heart beats from the top and bottom of the heart which are benign  Patient writing back on 8/20:  I'm still having palpitations, is their anything I need to do, or something to help with the palpitations?  Attempted to call patient to discuss symptoms further, no answer and unable to leave VM. [facility] sent.

## 2024-04-18 ENCOUNTER — Telehealth: Payer: Self-pay

## 2024-04-18 MED ORDER — METOPROLOL SUCCINATE ER 50 MG PO TB24: 50.0000 mg | ORAL_TABLET | Freq: Every day | ORAL | 3 refills | Status: DC

## 2024-04-18 NOTE — Telephone Encounter (Signed)
 MC to patient about increasing toprol  to 50 mg daily due to palpitations.

## 2024-06-05 ENCOUNTER — Other Ambulatory Visit: Payer: Self-pay | Admitting: Cardiology

## 2024-07-20 ENCOUNTER — Emergency Department (HOSPITAL_COMMUNITY)

## 2024-07-20 ENCOUNTER — Encounter (HOSPITAL_COMMUNITY): Payer: Self-pay

## 2024-07-20 ENCOUNTER — Inpatient Hospital Stay (HOSPITAL_COMMUNITY)
Admission: EM | Admit: 2024-07-20 | Discharge: 2024-08-14 | DRG: 981 | Disposition: A | Discharge status: Skilled Nursing Facility | Attending: Internal Medicine | Admitting: Internal Medicine

## 2024-07-20 DIAGNOSIS — R6521 Severe sepsis with septic shock: Secondary | ICD-10-CM | POA: Diagnosis not present

## 2024-07-20 DIAGNOSIS — I10 Essential (primary) hypertension: Secondary | ICD-10-CM | POA: Diagnosis present

## 2024-07-20 DIAGNOSIS — Z7901 Long term (current) use of anticoagulants: Secondary | ICD-10-CM

## 2024-07-20 DIAGNOSIS — R197 Diarrhea, unspecified: Secondary | ICD-10-CM | POA: Diagnosis present

## 2024-07-20 DIAGNOSIS — E860 Dehydration: Secondary | ICD-10-CM | POA: Diagnosis present

## 2024-07-20 DIAGNOSIS — I2602 Saddle embolus of pulmonary artery with acute cor pulmonale: Secondary | ICD-10-CM | POA: Diagnosis present

## 2024-07-20 DIAGNOSIS — Z6832 Body mass index (BMI) 32.0-32.9, adult: Secondary | ICD-10-CM | POA: Diagnosis not present

## 2024-07-20 DIAGNOSIS — Z1152 Encounter for screening for COVID-19: Secondary | ICD-10-CM

## 2024-07-20 DIAGNOSIS — E66811 Obesity, class 1: Secondary | ICD-10-CM | POA: Diagnosis present

## 2024-07-20 DIAGNOSIS — G9341 Metabolic encephalopathy: Secondary | ICD-10-CM | POA: Diagnosis present

## 2024-07-20 DIAGNOSIS — N170 Acute kidney failure with tubular necrosis: Secondary | ICD-10-CM | POA: Diagnosis present

## 2024-07-20 DIAGNOSIS — B9789 Other viral agents as the cause of diseases classified elsewhere: Secondary | ICD-10-CM | POA: Diagnosis present

## 2024-07-20 DIAGNOSIS — E0520 Thyrotoxicosis with toxic multinodular goiter without thyrotoxic crisis or storm: Secondary | ICD-10-CM | POA: Diagnosis present

## 2024-07-20 DIAGNOSIS — E875 Hyperkalemia: Secondary | ICD-10-CM | POA: Diagnosis present

## 2024-07-20 DIAGNOSIS — E119 Type 2 diabetes mellitus without complications: Secondary | ICD-10-CM | POA: Diagnosis not present

## 2024-07-20 DIAGNOSIS — E1110 Type 2 diabetes mellitus with ketoacidosis without coma: Secondary | ICD-10-CM | POA: Diagnosis present

## 2024-07-20 DIAGNOSIS — J15211 Pneumonia due to Methicillin susceptible Staphylococcus aureus: Secondary | ICD-10-CM | POA: Diagnosis present

## 2024-07-20 DIAGNOSIS — E870 Hyperosmolality and hypernatremia: Secondary | ICD-10-CM | POA: Diagnosis present

## 2024-07-20 DIAGNOSIS — E1011 Type 1 diabetes mellitus with ketoacidosis with coma: Secondary | ICD-10-CM | POA: Diagnosis present

## 2024-07-20 DIAGNOSIS — E785 Hyperlipidemia, unspecified: Secondary | ICD-10-CM | POA: Diagnosis present

## 2024-07-20 DIAGNOSIS — D696 Thrombocytopenia, unspecified: Secondary | ICD-10-CM | POA: Diagnosis present

## 2024-07-20 DIAGNOSIS — R578 Other shock: Secondary | ICD-10-CM | POA: Diagnosis present

## 2024-07-20 DIAGNOSIS — Z79899 Other long term (current) drug therapy: Secondary | ICD-10-CM

## 2024-07-20 DIAGNOSIS — E1010 Type 1 diabetes mellitus with ketoacidosis without coma: Secondary | ICD-10-CM | POA: Diagnosis present

## 2024-07-20 DIAGNOSIS — I7121 Aneurysm of the ascending aorta, without rupture: Secondary | ICD-10-CM | POA: Diagnosis present

## 2024-07-20 DIAGNOSIS — M549 Dorsalgia, unspecified: Secondary | ICD-10-CM | POA: Diagnosis present

## 2024-07-20 DIAGNOSIS — R579 Shock, unspecified: Secondary | ICD-10-CM | POA: Diagnosis not present

## 2024-07-20 DIAGNOSIS — J209 Acute bronchitis, unspecified: Secondary | ICD-10-CM | POA: Diagnosis present

## 2024-07-20 DIAGNOSIS — Z751 Person awaiting admission to adequate facility elsewhere: Secondary | ICD-10-CM

## 2024-07-20 DIAGNOSIS — E1111 Type 2 diabetes mellitus with ketoacidosis with coma: Secondary | ICD-10-CM | POA: Diagnosis not present

## 2024-07-20 DIAGNOSIS — R7881 Bacteremia: Secondary | ICD-10-CM | POA: Diagnosis not present

## 2024-07-20 DIAGNOSIS — A419 Sepsis, unspecified organism: Secondary | ICD-10-CM | POA: Diagnosis not present

## 2024-07-20 DIAGNOSIS — Z888 Allergy status to other drugs, medicaments and biological substances status: Secondary | ICD-10-CM

## 2024-07-20 DIAGNOSIS — B9710 Unspecified enterovirus as the cause of diseases classified elsewhere: Secondary | ICD-10-CM | POA: Diagnosis present

## 2024-07-20 DIAGNOSIS — Z86711 Personal history of pulmonary embolism: Secondary | ICD-10-CM | POA: Diagnosis not present

## 2024-07-20 DIAGNOSIS — M6282 Rhabdomyolysis: Secondary | ICD-10-CM | POA: Diagnosis present

## 2024-07-20 DIAGNOSIS — E876 Hypokalemia: Secondary | ICD-10-CM | POA: Diagnosis present

## 2024-07-20 DIAGNOSIS — I2609 Other pulmonary embolism with acute cor pulmonale: Secondary | ICD-10-CM | POA: Diagnosis not present

## 2024-07-20 DIAGNOSIS — Z833 Family history of diabetes mellitus: Secondary | ICD-10-CM

## 2024-07-20 DIAGNOSIS — J9601 Acute respiratory failure with hypoxia: Secondary | ICD-10-CM | POA: Diagnosis present

## 2024-07-20 DIAGNOSIS — N179 Acute kidney failure, unspecified: Secondary | ICD-10-CM | POA: Diagnosis not present

## 2024-07-20 DIAGNOSIS — Z794 Long term (current) use of insulin: Secondary | ICD-10-CM | POA: Diagnosis not present

## 2024-07-20 DIAGNOSIS — E861 Hypovolemia: Secondary | ICD-10-CM | POA: Diagnosis present

## 2024-07-20 DIAGNOSIS — I951 Orthostatic hypotension: Secondary | ICD-10-CM | POA: Diagnosis present

## 2024-07-20 DIAGNOSIS — R4182 Altered mental status, unspecified: Secondary | ICD-10-CM | POA: Diagnosis present

## 2024-07-20 DIAGNOSIS — G8929 Other chronic pain: Secondary | ICD-10-CM | POA: Diagnosis present

## 2024-07-20 DIAGNOSIS — Z8249 Family history of ischemic heart disease and other diseases of the circulatory system: Secondary | ICD-10-CM

## 2024-07-20 DIAGNOSIS — Z7982 Long term (current) use of aspirin: Secondary | ICD-10-CM

## 2024-07-20 DIAGNOSIS — M722 Plantar fascial fibromatosis: Secondary | ICD-10-CM | POA: Diagnosis present

## 2024-07-20 DIAGNOSIS — Z7984 Long term (current) use of oral hypoglycemic drugs: Secondary | ICD-10-CM

## 2024-07-20 DIAGNOSIS — E109 Type 1 diabetes mellitus without complications: Secondary | ICD-10-CM | POA: Diagnosis not present

## 2024-07-20 LAB — CBC WITH DIFFERENTIAL/PLATELET
Abs Immature Granulocytes: 0.9 K/uL — ABNORMAL HIGH (ref 0.00–0.07)
Band Neutrophils: 4 %
Basophils Absolute: 0 K/uL (ref 0.0–0.1)
Basophils Relative: 0 %
Eosinophils Absolute: 0 K/uL (ref 0.0–0.5)
Eosinophils Relative: 0 %
HCT: 52.9 % — ABNORMAL HIGH (ref 39.0–52.0)
Hemoglobin: 16.2 g/dL (ref 13.0–17.0)
Lymphocytes Relative: 4 %
Lymphs Abs: 0.9 K/uL (ref 0.7–4.0)
MCH: 31.1 pg (ref 26.0–34.0)
MCHC: 30.6 g/dL (ref 30.0–36.0)
MCV: 101.5 fL — ABNORMAL HIGH (ref 80.0–100.0)
Monocytes Absolute: 2.2 K/uL — ABNORMAL HIGH (ref 0.1–1.0)
Monocytes Relative: 10 %
Myelocytes: 4 %
Neutro Abs: 18.3 K/uL — ABNORMAL HIGH (ref 1.7–7.7)
Neutrophils Relative %: 78 %
Platelets: 242 K/uL (ref 150–400)
RBC: 5.21 MIL/uL (ref 4.22–5.81)
RDW: 14.3 % (ref 11.5–15.5)
WBC: 22.3 K/uL — ABNORMAL HIGH (ref 4.0–10.5)
nRBC: 0 % (ref 0.0–0.2)

## 2024-07-20 LAB — URINALYSIS, ROUTINE W REFLEX MICROSCOPIC
Bilirubin Urine: NEGATIVE
Glucose, UA: >=500 mg/dL — AB
Ketones, ur: 20 mg/dL — AB
Leukocytes,Ua: NEGATIVE
Nitrite: NEGATIVE
Protein, ur: 30 mg/dL — AB
Specific Gravity, Urine: 1.018 (ref 1.005–1.030)
pH: 5 (ref 5.0–8.0)

## 2024-07-20 LAB — I-STAT VENOUS BLOOD GAS, ED
Acid-base deficit: 26 mmol/L — ABNORMAL HIGH (ref 0.0–2.0)
Bicarbonate: 3.8 mmol/L — ABNORMAL LOW (ref 20.0–28.0)
Calcium, Ion: 0.92 mmol/L — ABNORMAL LOW (ref 1.15–1.40)
HCT: 51 % (ref 39.0–52.0)
Hemoglobin: 17.3 g/dL — ABNORMAL HIGH (ref 13.0–17.0)
O2 Saturation: 59 %
Potassium: 5 mmol/L (ref 3.5–5.1)
Sodium: 134 mmol/L — ABNORMAL LOW (ref 135–145)
TCO2: <5 mmol/L — ABNORMAL LOW (ref 22–32)
pCO2, Ven: 15.3 mmHg — CL (ref 44–60)
pH, Ven: 6.999 — CL (ref 7.25–7.43)
pO2, Ven: 45 mmHg (ref 32–45)

## 2024-07-20 LAB — CBG MONITORING, ED: Glucose-Capillary: >600 mg/dL (ref 70–99)

## 2024-07-20 LAB — BASIC METABOLIC PANEL WITH GFR
BUN: 56 mg/dL — ABNORMAL HIGH (ref 6–20)
CO2: <7 mmol/L — ABNORMAL LOW (ref 22–32)
Calcium: 8.8 mg/dL — ABNORMAL LOW (ref 8.9–10.3)
Chloride: 100 mmol/L (ref 98–111)
Creatinine, Ser: 3.82 mg/dL — ABNORMAL HIGH (ref 0.61–1.24)
GFR, Estimated: 17 mL/min — ABNORMAL LOW (ref >60)
Glucose, Bld: 1045 mg/dL (ref 70–99)
Potassium: 4.1 mmol/L (ref 3.5–5.1)
Sodium: 140 mmol/L (ref 135–145)

## 2024-07-20 LAB — LIPASE, BLOOD: Lipase: 72 U/L — ABNORMAL HIGH (ref 11–51)

## 2024-07-20 LAB — BETA-HYDROXYBUTYRIC ACID: Beta-Hydroxybutyric Acid: >8 mmol/L — ABNORMAL HIGH (ref 0.05–0.27)

## 2024-07-20 MED ORDER — LACTATED RINGERS IV BOLUS
1000.0000 mL | INTRAVENOUS | Status: AC
  Administered 2024-07-20: 1000 mL via INTRAVENOUS

## 2024-07-20 MED ORDER — DOCUSATE SODIUM 100 MG PO CAPS: 100.0000 mg | ORAL_CAPSULE | Freq: Two times a day (BID) | ORAL | Status: DC | PRN

## 2024-07-20 MED ORDER — POLYETHYLENE GLYCOL 3350 17 G PO PACK: 17.0000 g | PACK | Freq: Every day | ORAL | Status: DC | PRN

## 2024-07-20 MED ORDER — INSULIN REGULAR(HUMAN) IN NACL 100-0.9 UT/100ML-% IV SOLN
INTRAVENOUS | Status: DC
  Administered 2024-07-20 – 2024-07-21 (×2): 11.5 [IU]/h via INTRAVENOUS
  Administered 2024-07-21: 14 [IU]/h via INTRAVENOUS
  Filled 2024-07-20 (×4): qty 100

## 2024-07-20 MED ORDER — HEPARIN SODIUM (PORCINE) 5000 UNIT/ML IJ SOLN
5000.0000 [IU] | Freq: Three times a day (TID) | INTRAMUSCULAR | Status: DC
  Administered 2024-07-21 – 2024-07-23 (×7): 5000 [IU] via SUBCUTANEOUS
  Filled 2024-07-20 (×7): qty 1

## 2024-07-20 MED ORDER — CHLORHEXIDINE GLUCONATE CLOTH 2 % EX PADS: 6.0000 | MEDICATED_PAD | Freq: Every day | CUTANEOUS | Status: DC

## 2024-07-20 MED ORDER — DEXTROSE 50 % IV SOLN: 0.0000 mL | INTRAVENOUS | Status: DC | PRN

## 2024-07-20 MED ORDER — ONDANSETRON HCL 4 MG/2ML IJ SOLN
4.0000 mg | Freq: Once | INTRAMUSCULAR | Status: AC
  Administered 2024-07-20: 4 mg via INTRAVENOUS
  Filled 2024-07-20: qty 2

## 2024-07-20 MED ORDER — LACTATED RINGERS IV SOLN: INTRAVENOUS | Status: AC

## 2024-07-20 MED ORDER — SODIUM CHLORIDE 0.9 % IV SOLN: 12.5000 mg | Freq: Four times a day (QID) | INTRAVENOUS | Status: DC | PRN

## 2024-07-20 MED ORDER — ONDANSETRON HCL 4 MG/2ML IJ SOLN: 4.0000 mg | Freq: Four times a day (QID) | INTRAMUSCULAR | Status: DC | PRN

## 2024-07-20 MED ORDER — SODIUM CHLORIDE 0.9 % IV SOLN
3.0000 g | Freq: Two times a day (BID) | INTRAVENOUS | Status: DC
  Administered 2024-07-21 (×3): 3 g via INTRAVENOUS
  Filled 2024-07-20 (×3): qty 8

## 2024-07-20 MED ORDER — POTASSIUM CHLORIDE 10 MEQ/100ML IV SOLN
10.0000 meq | INTRAVENOUS | Status: DC
  Administered 2024-07-20: 10 meq via INTRAVENOUS
  Filled 2024-07-20 (×2): qty 100

## 2024-07-20 MED ORDER — DEXTROSE IN LACTATED RINGERS 5 % IV SOLN: INTRAVENOUS | Status: AC

## 2024-07-20 MED ORDER — MORPHINE SULFATE (PF) 4 MG/ML IV SOLN
4.0000 mg | Freq: Once | INTRAVENOUS | Status: AC
  Administered 2024-07-20: 4 mg via INTRAVENOUS
  Filled 2024-07-20: qty 1

## 2024-07-20 MED ORDER — ACETAMINOPHEN 10 MG/ML IV SOLN
1000.0000 mg | Freq: Four times a day (QID) | INTRAVENOUS | Status: AC | PRN
  Administered 2024-07-22: 1000 mg via INTRAVENOUS
  Filled 2024-07-20: qty 100

## 2024-07-20 NOTE — ED Provider Notes (Signed)
 [place] EMERGENCY DEPARTMENT AT [facility] Provider Note   CSN: [id number] Arrival date & time: 07/20/24  1902     Patient presents with: Loss of Consciousness, Weakness, and Hyperglycemia   Jorge Calhoun is a 58 y.o. male.   59 yo M with a cc of unresponsiveness.  Patient has been having nausea and vomiting going on for a few days now.  Decreased oral intake at home.  Wife left the house and when she returned found him on the bathroom floor unresponsive.  Brought in by EMS.  Was seen at the [place] yesterday and was started on his medications wife not sure what was started.        Prior to Admission medications   Medication Sig Start Date End Date Taking? Authorizing Provider  aspirin 81 MG tablet Take 81 mg by mouth daily.    [provider]  atorvastatin  (LIPITOR) 40 MG tablet Take 1 tablet (40 mg total) by mouth daily. 03/29/24   Dunn, Dayna N, PA-C  chlorthalidone  (HYGROTON ) 25 MG tablet Take 1 tablet (25 mg total) by mouth daily. 06/06/24   Shlomo Wilbert SAUNDERS, MD  cyclobenzaprine (FLEXERIL) 5 MG tablet Take 5-10 mg by mouth at bedtime as needed for muscle spasms. 09/25/22   [provider]  diltiazem  (CARDIZEM  CD) 360 MG 24 hr capsule Take 1 capsule (360 mg total) by mouth daily. 10/12/23   Shlomo Wilbert SAUNDERS, MD  empagliflozin (JARDIANCE) 25 MG TABS tablet 12.5 mg daily. 11/26/22   [provider]  insulin lispro (HUMALOG) 100 UNIT/ML injection Inject 30 Units into the skin 3 (three) times daily before meals.    [provider]  meloxicam (MOBIC) 15 MG tablet Take 15 mg by mouth as needed for pain. 11/25/22   [provider]  metoprolol  succinate (TOPROL  XL) 50 MG 24 hr tablet Take 1 tablet (50 mg total) by mouth daily. Take with or immediately following a meal. 04/18/24   Turner, Wilbert SAUNDERS, MD  Multiple Vitamin (MULTIVITAMIN) capsule Take 1 capsule by mouth daily.    [provider]  olmesartan (BENICAR) 40 MG tablet Take 40  mg by mouth daily. 09/16/20   [provider]  potassium chloride  SA (KLOR-CON  M20) 20 MEQ tablet Take 2 tablets (40 mEq total) by mouth daily. 01/19/23   Dunn, Dayna N, PA-C  TRESIBA FLEXTOUCH 100 UNIT/ML FlexTouch Pen Inject 25 Units into the skin daily. 01/07/20   [provider]    Allergies: Metformin and related    Review of Systems  Updated Vital Signs BP 96/66 (BP Location: Right Arm)   Pulse 84   Temp 98.7 F (37.1 C)   Resp 17   Ht 5' 11 (1.803 m)   Wt 108.9 kg   SpO2 100%   BMI 33.47 kg/m   Physical Exam Vitals and nursing note reviewed.  Constitutional:      Appearance: He is well-developed.  HENT:     Head: Normocephalic and atraumatic.  Eyes:     Pupils: Pupils are equal, round, and reactive to light.  Neck:     Vascular: No JVD.  Cardiovascular:     Rate and Rhythm: Normal rate and regular rhythm.     Heart sounds: No murmur heard.    No friction rub. No gallop.  Pulmonary:     Effort: No respiratory distress.     Breath sounds: No wheezing.  Abdominal:     General: There is no distension.  Tenderness: There is no abdominal tenderness. There is no guarding or rebound.  Musculoskeletal:        General: Normal range of motion.     Cervical back: Normal range of motion and neck supple.  Skin:    Coloration: Skin is not pale.     Findings: No rash.  Neurological:     Comments: Sleepy, rapid respiratory rate.  Moans to pain.   Psychiatric:        Behavior: Behavior normal.     (all labs ordered are listed, but only abnormal results are displayed) Labs Reviewed  BASIC METABOLIC PANEL WITH GFR - Abnormal; Notable for the following components:      Result Value   CO2 <7 (*)    Glucose, Bld 1,045 (*)    BUN 56 (*)    Creatinine, Ser 3.82 (*)    Calcium  8.8 (*)    GFR, Estimated 17 (*)    All other components within normal limits  CBC WITH DIFFERENTIAL/PLATELET - Abnormal; Notable for the following components:   WBC 22.3 (*)     HCT 52.9 (*)    MCV 101.5 (*)    Neutro Abs 18.3 (*)    Monocytes Absolute 2.2 (*)    Abs Immature Granulocytes 0.90 (*)    All other components within normal limits  URINALYSIS, ROUTINE W REFLEX MICROSCOPIC - Abnormal; Notable for the following components:   APPearance HAZY (*)    Glucose, UA >=500 (*)    Hgb urine dipstick LARGE (*)    Ketones, ur 20 (*)    Protein, ur 30 (*)    Bacteria, UA RARE (*)    All other components within normal limits  CBG MONITORING, ED - Abnormal; Notable for the following components:   Glucose-Capillary >600 (*)    All other components within normal limits  I-STAT VENOUS BLOOD GAS, ED - Abnormal; Notable for the following components:   pH, Ven 6.999 (*)    pCO2, Ven 15.3 (*)    Bicarbonate 3.8 (*)    TCO2 <5 (*)    Acid-base deficit 26.0 (*)    Sodium 134 (*)    Calcium , Ion 0.92 (*)    Hemoglobin 17.3 (*)    All other components within normal limits  MRSA NEXT GEN BY PCR, NASAL  BASIC METABOLIC PANEL WITH GFR  BASIC METABOLIC PANEL WITH GFR  BASIC METABOLIC PANEL WITH GFR  BETA-HYDROXYBUTYRIC ACID  BETA-HYDROXYBUTYRIC ACID  BETA-HYDROXYBUTYRIC ACID  BETA-HYDROXYBUTYRIC ACID  HEPATIC FUNCTION PANEL  LIPASE, BLOOD  HIV ANTIBODY (ROUTINE TESTING W REFLEX)  I-STAT CHEM 8, ED    EKG: None  Radiology: CT Cervical Spine Wo Contrast Result Date: 07/20/2024 EXAM: CT CERVICAL SPINE WITHOUT CONTRAST 07/20/2024 09:02:52 PM TECHNIQUE: CT of the cervical spine was performed without the administration of intravenous contrast. Multiplanar reformatted images are provided for review. Automated exposure control, iterative reconstruction, and/or weight based adjustment of the mA/kV was utilized to reduce the radiation dose to as low as reasonably achievable. COMPARISON: None available. 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 No acute fracture. DEGENERATIVE CHANGES: No severe degenerative changes. SOFT TISSUES: No prevertebral soft tissue swelling. IMPRESSION: 1. No acute abnormality of the cervical spine. Electronically signed by: Norman Gatlin MD 07/20/2024 09:11 PM EST RP Workstation: [id number]   CT Head Wo Contrast Result Date: 07/20/2024 EXAM: CT HEAD WITHOUT CONTRAST 07/20/2024 09:02:52 PM  TECHNIQUE: CT of the head was performed without the administration of intravenous contrast. Automated exposure control, iterative reconstruction, and/or weight based adjustment of the mA/kV was utilized to reduce the radiation dose to as low as reasonably achievable. COMPARISON: None available. CLINICAL HISTORY: Delirium FINDINGS: BRAIN AND VENTRICLES: No acute hemorrhage. No evidence of acute infarct. No hydrocephalus. No extra-axial collection. No mass effect or midline shift. ORBITS: No acute abnormality. SINUSES: Mucosal thickening in the ethmoid air cells. Near complete opacification of the right maxillary sinus with air-fluid level. SOFT TISSUES AND SKULL: No acute soft tissue abnormality. No skull fracture. IMPRESSION: 1. No acute intracranial abnormality. 2. Near complete opacification of the right maxillary sinus with air-fluid level. Correlate for sinusitis. Electronically signed by: Norman Gatlin MD 07/20/2024 09:09 PM EST RP Workstation: [id number]   DG Chest 1 View Result Date: 07/20/2024 CLINICAL DATA:  Altered mental status EXAM: CHEST  1 VIEW COMPARISON:  Chest x-ray 04/28/2018 FINDINGS: The heart size and mediastinal contours are within normal limits. Both lungs are clear. The visualized skeletal structures are unremarkable. IMPRESSION: No active disease. Electronically Signed   By: Greig Pique M.D.   On: 07/20/2024 20:23     .Critical Care  Performed by: Emil Share, DO Authorized by: Emil Share, DO   Critical care provider statement:    Critical care time (minutes):  35   Critical care time was  exclusive of:  Separately billable procedures and treating other patients   Critical care was time spent personally by me on the following activities:  Development of treatment plan with patient or surrogate, discussions with consultants, evaluation of patient's response to treatment, examination of patient, ordering and review of laboratory studies, ordering and review of radiographic studies, ordering and performing treatments and interventions, pulse oximetry, re-evaluation of patient's condition and review of old charts   Care discussed with: admitting provider      Medications Ordered in the ED  lactated ringers  bolus 1,000 mL (0 mLs Intravenous Stopped 07/20/24 2217)  insulin  regular, human (MYXREDLIN ) 100 units/ 100 mL infusion (has no administration in time range)  lactated ringers  infusion ( Intravenous New Bag/Given 07/20/24 2216)  dextrose  5 % in lactated ringers  infusion ( Intravenous New Bag/Given 07/20/24 2230)  dextrose  50 % solution 0-50 mL (has no administration in time range)  potassium chloride  10 mEq in 100 mL IVPB (10 mEq Intravenous New Bag/Given 07/20/24 2251)  Chlorhexidine  Gluconate Cloth 2 % PADS 6 each (has no administration in time range)  docusate sodium  (COLACE) capsule 100 mg (has no administration in time range)  polyethylene glycol (MIRALAX  / GLYCOLAX ) packet 17 g (has no administration in time range)  heparin  injection 5,000 Units (has no administration in time range)  morphine  (PF) 4 MG/ML injection 4 mg (4 mg Intravenous Given 07/20/24 2214)  ondansetron  (ZOFRAN ) injection 4 mg (4 mg Intravenous Given 07/20/24 2214)                                    Medical Decision Making Amount and/or Complexity of Data Reviewed Labs: ordered. Radiology: ordered.  Risk Prescription drug management.   58 yo M with a cc of AMS.  Patient having n/v going on for the past few days.  Found on the ground at home.  CBG >600 on arrival.  Type 1 DM.  Reportedly compliant with  medications.   Leukocytosis.  PH 6.9, bicarb <5.    Modest improvement  in mental status with IV fluids.   Blood sugar >1000.  Discussed with critical care will admit.   The patients results and plan were reviewed and discussed.   Any x-rays performed were independently reviewed by myself.   Differential diagnosis were considered with the presenting HPI.  Medications  lactated ringers bolus 1,000 mL (0 mLs Intravenous Stopped 07/20/24 2217)  insulin regular, human (MYXREDLIN) 100 units/ 100 mL infusion (has no administration in time range)  lactated ringers infusion ( Intravenous New Bag/Given 07/20/24 2216)  dextrose 5 % in lactated ringers infusion ( Intravenous New Bag/Given 07/20/24 2230)  dextrose 50 % solution 0-50 mL (has no administration in time range)  potassium chloride  10 mEq in 100 mL IVPB (10 mEq Intravenous New Bag/Given 07/20/24 2251)  Chlorhexidine Gluconate Cloth 2 % PADS 6 each (has no administration in time range)  docusate sodium (COLACE) capsule 100 mg (has no administration in time range)  polyethylene glycol (MIRALAX / GLYCOLAX) packet 17 g (has no administration in time range)  heparin injection 5,000 Units (has no administration in time range)  morphine (PF) 4 MG/ML injection 4 mg (4 mg Intravenous Given 07/20/24 2214)  ondansetron (ZOFRAN) injection 4 mg (4 mg Intravenous Given 07/20/24 2214)    Vitals:   07/20/24 2014 07/20/24 2016 07/20/24 2017 07/20/24 2138  BP:    96/66  Pulse:    84  Resp:    17  Temp:    98.7 F (37.1 C)  SpO2: 99% 99%  100%  Weight:   108.9 kg   Height:   5' 11 (1.803 m)     Final diagnoses:  Diabetic ketoacidosis with coma associated with type 1 diabetes mellitus (HCC)    Admission/ observation were discussed with the admitting physician, patient and/or family and they are comfortable with the plan.         Final diagnoses:  Diabetic ketoacidosis with coma associated with type 1 diabetes mellitus [facility])    ED  Discharge Orders     None          Emil Share, DO 07/20/24 2255

## 2024-07-20 NOTE — H&P (Signed)
 NAME:  Jorge Calhoun, MRN:  [id number], DOB:  1965-11-18, LOS: 0 ADMISSION DATE:  07/20/2024, CONSULTATION DATE:  07/20/24 REFERRING MD:  EDP, CHIEF COMPLAINT:  AMS   History of Present Illness:  58 year old man w/ type 1 DM presenting with 3-4 days of feeling generally unwell, poor PO, N/V.  Went to [place] yesterday and given something for an infection.  Today found unresponsive at home, workup in ER neg head imaging, labs with profound hyperglycemia, DKA.  PCCM to admit given degree of AMS and hyperglycemia.  Has come around a little with IVF.  Having some back pain but this is chronic.  Was given morphine before I saw him so he is pretty out again.  Spouse at bedside to corroborate hx.  + sinus problems but chronic, no clear fevers/chills.  Full 14 point ROS + for confusion, fatigue, poor PO, N/V.  Care everywhere reviewed: [facility]:  Assessment: 1. Acute bronchitis  Plan: 1. Rx Zofran ODT, Augmentin 875, Mucinex ER, chlorpheniramine and Tessalon  [place]. Work excuse given to return to work on [employer] time: Jul 19, 2024@10 :70  Test Name Result Units Range --------- ------ ----- ----- POC GLUCOSE 251 H mg/dL 70 - 889 POC CREATININE 1.4 H mg/dL 0.6 - 1.4 POC SODIUM 858 mmol/L 138 - 146 POC BUN 20 mg/dL 8 - 26 POC URN7(FZJDLMZI) 12 L mmol/L 24 - 29 POC HEMOG(CALC) 17.3 H g/dL 12 - 18 POC POTASSIUM 3.7 mmol/L 3.5 - 4.9 POC CHLORIDE 106 mmol/L 98 - 109 POC HEMATOCRIT 51 %PCV 37 - 52 POC IONIZED CALCIUM  4.3 L mg/dL 4.5 - 5.3  Pertinent  Medical History  HTN DM  Significant Hospital Events: Including procedures, antibiotic start and stop dates in addition to other pertinent events   12/4 admit  Interim History / Subjective:  admit  Objective    Blood pressure 96/66, pulse 84, temperature 98.7 F (37.1 C), resp. rate 17, height 5' 11 (1.803 m), weight 108.9 kg, SpO2 100%.       No intake or output data in the 24 hours ending 07/20/24 2331 Filed  Weights   07/20/24 2017  Weight: 108.9 kg    Examination: General: well developed, sleepy HENT: trachea midline, no clear sinus TTP Lungs: clear, nonlabored, he should probably be breathing faster but I think this is the morphine Cardiovascular: regular ext warm Abdomen: soft, NTND Extremities: no edema Neuro: Moves everything with enough prompting Skin: no rashes  Labs/imaging reviewed  Resolved problem list   Assessment and Plan   Type 1 DKA- precipitated by GI illness after cruise Associated metabolic encephalopathy- hyperosmolar state also likely contributing Acute kidney injury, dehydration Hypernatremia (corrected 155-163) Sinusitis query acute HTN Lipase mildly up  - ICU admit for airway watch - No more morphine, can do IV tylenol if needed for pain - Start unasyn for sinusitis NOS - Check EKG, trops, RVP, COVID, TSH to r/o other confounders - Usual aggressive fluid hydration and insulin; suspect will need a good deal of potassium repletion - Wife updated  Labs   CBC: Recent Labs  Lab 07/20/24 2118 07/20/24 2123  WBC 22.3*  --   NEUTROABS 18.3*  --   HGB 16.2 17.3*  HCT 52.9* 51.0  MCV 101.5*  --   PLT 242  --     Basic Metabolic Panel: Recent Labs  Lab 07/20/24 2118 07/20/24 2123  NA 140 134*  K 4.1 5.0  CL 100  --   CO2 <7*  --  GLUCOSE 1,045*  --   BUN 56*  --   CREATININE 3.82*  --   CALCIUM  8.8*  --    GFR: Estimated Creatinine Clearance: 26.1 mL/min (A) (by C-G formula based on SCr of 3.82 mg/dL (H)). Recent Labs  Lab 07/20/24 2118  WBC 22.3*    Liver Function Tests: No results for input(s): AST, ALT, ALKPHOS, BILITOT, PROT, ALBUMIN in the last 168 hours. Recent Labs  Lab 07/20/24 2118  LIPASE 72*   No results for input(s): AMMONIA in the last 168 hours.  ABG    Component Value Date/Time   HCO3 3.8 (L) 07/20/2024 2123   TCO2 <5 (L) 07/20/2024 2123   ACIDBASEDEF 26.0 (H) 07/20/2024 2123   O2SAT 59  07/20/2024 2123     Coagulation Profile: No results for input(s): INR, PROTIME in the last 168 hours.  Cardiac Enzymes: No results for input(s): CKTOTAL, CKMB, CKMBINDEX, TROPONINI in the last 168 hours.  HbA1C: No results found for: HGBA1C  CBG: Recent Labs  Lab 07/20/24 1918  GLUCAP >600*    Review of Systems:   Per HPI  by spouse  Past Medical History:  He,  has a past medical history of Hypertension, Nontoxic multinodular goiter, Obesity, unspecified, Plantar fasciitis, PSVT (paroxysmal supraventricular tachycardia), and Type I (juvenile type) diabetes mellitus without mention of complication, not stated as uncontrolled.   Surgical History:  History reviewed. No pertinent surgical history.   Social History:   reports that he has never smoked. He has never used smokeless tobacco.   Family History:  His family history includes Cancer - Ovarian in his maternal grandmother; Diabetes in his father and paternal grandmother; Hypertension in his father and mother.   Allergies Allergies  Allergen Reactions   Metformin And Related Nausea Only     Home Medications  Prior to Admission medications   Medication Sig Start Date End Date Taking? Authorizing Provider  aspirin 81 MG tablet Take 81 mg by mouth daily.    [provider]  atorvastatin  (LIPITOR) 40 MG tablet Take 1 tablet (40 mg total) by mouth daily. 03/29/24   Dunn, Dayna N, PA-C  chlorthalidone  (HYGROTON ) 25 MG tablet Take 1 tablet (25 mg total) by mouth daily. 06/06/24   Jorge Wilbert SAUNDERS, MD  cyclobenzaprine (FLEXERIL) 5 MG tablet Take 5-10 mg by mouth at bedtime as needed for muscle spasms. 09/25/22   [provider]  diltiazem  (CARDIZEM  CD) 360 MG 24 hr capsule Take 1 capsule (360 mg total) by mouth daily. 10/12/23   Jorge Wilbert SAUNDERS, MD  empagliflozin (JARDIANCE) 25 MG TABS tablet 12.5 mg daily. 11/26/22   [provider]  insulin lispro (HUMALOG) 100 UNIT/ML injection Inject 30  Units into the skin 3 (three) times daily before meals.    [provider]  meloxicam (MOBIC) 15 MG tablet Take 15 mg by mouth as needed for pain. 11/25/22   [provider]  metoprolol  succinate (TOPROL  XL) 50 MG 24 hr tablet Take 1 tablet (50 mg total) by mouth daily. Take with or immediately following a meal. 04/18/24   Turner, Wilbert SAUNDERS, MD  Multiple Vitamin (MULTIVITAMIN) capsule Take 1 capsule by mouth daily.    [provider]  olmesartan (BENICAR) 40 MG tablet Take 40 mg by mouth daily. 09/16/20   [provider]  potassium chloride  SA (KLOR-CON  M20) 20 MEQ tablet Take 2 tablets (40 mEq total) by mouth daily. 01/19/23   Dunn, Dayna N, PA-C  TRESIBA FLEXTOUCH 100 UNIT/ML FlexTouch Pen  Inject 25 Units into the skin daily. 01/07/20   [provider]     Critical care time: 32 mins

## 2024-07-20 NOTE — ED Triage Notes (Signed)
 Pt brought to ED by [facility] with reports of being found unresponsive. Wife reported to EMS that pt has been home sick with GI symptoms for about 3 days. Wife last seen pt around noon and came home around 6pm and found him face down in the bathroom floor unresponsive. Pt is diabetic. 20G bilateral hands.  EMS vitals BP systolic in the 80s O2-100 on 2liters R-13 CO-12 CBG over 400 P-80

## 2024-07-21 ENCOUNTER — Inpatient Hospital Stay (HOSPITAL_COMMUNITY)

## 2024-07-21 DIAGNOSIS — E1010 Type 1 diabetes mellitus with ketoacidosis without coma: Secondary | ICD-10-CM | POA: Diagnosis present

## 2024-07-21 LAB — CBC
HCT: 44.6 % (ref 39.0–52.0)
Hemoglobin: 15.1 g/dL (ref 13.0–17.0)
MCH: 31.7 pg (ref 26.0–34.0)
MCHC: 33.9 g/dL (ref 30.0–36.0)
MCV: 93.5 fL (ref 80.0–100.0)
Platelets: 137 K/uL — ABNORMAL LOW (ref 150–400)
RBC: 4.77 MIL/uL (ref 4.22–5.81)
RDW: 13.7 % (ref 11.5–15.5)
WBC: 17.5 K/uL — ABNORMAL HIGH (ref 4.0–10.5)
nRBC: 0.1 % (ref 0.0–0.2)

## 2024-07-21 LAB — RESPIRATORY PANEL BY PCR

## 2024-07-21 LAB — BASIC METABOLIC PANEL WITH GFR
Anion gap: 16 — ABNORMAL HIGH (ref 5–15)
Anion gap: 21 — ABNORMAL HIGH (ref 5–15)
Anion gap: 15 (ref 5–15)
BUN: 57 mg/dL — ABNORMAL HIGH (ref 6–20)
BUN: 55 mg/dL — ABNORMAL HIGH (ref 6–20)
BUN: 55 mg/dL — ABNORMAL HIGH (ref 6–20)
BUN: 55 mg/dL — ABNORMAL HIGH (ref 6–20)
CO2: <7 mmol/L — ABNORMAL LOW (ref 22–32)
CO2: 16 mmol/L — ABNORMAL LOW (ref 22–32)
CO2: 17 mmol/L — ABNORMAL LOW (ref 22–32)
CO2: 21 mmol/L — ABNORMAL LOW (ref 22–32)
Calcium: 7.8 mg/dL — ABNORMAL LOW (ref 8.9–10.3)
Calcium: 7.1 mg/dL — ABNORMAL LOW (ref 8.9–10.3)
Calcium: 9.2 mg/dL (ref 8.9–10.3)
Calcium: 9.2 mg/dL (ref 8.9–10.3)
Chloride: 100 mmol/L (ref 98–111)
Chloride: 118 mmol/L — ABNORMAL HIGH (ref 98–111)
Chloride: 112 mmol/L — ABNORMAL HIGH (ref 98–111)
Chloride: 117 mmol/L — ABNORMAL HIGH (ref 98–111)
Creatinine, Ser: 3.61 mg/dL — ABNORMAL HIGH (ref 0.61–1.24)
Creatinine, Ser: 2.87 mg/dL — ABNORMAL HIGH (ref 0.61–1.24)
Creatinine, Ser: 3.09 mg/dL — ABNORMAL HIGH (ref 0.61–1.24)
Creatinine, Ser: 3.05 mg/dL — ABNORMAL HIGH (ref 0.61–1.24)
GFR, Estimated: 19 mL/min — ABNORMAL LOW (ref >60)
GFR, Estimated: 24 mL/min — ABNORMAL LOW (ref >60)
GFR, Estimated: 22 mL/min — ABNORMAL LOW (ref >60)
GFR, Estimated: 23 mL/min — ABNORMAL LOW (ref >60)
Glucose, Bld: 991 mg/dL (ref 70–99)
Glucose, Bld: 527 mg/dL (ref 70–99)
Glucose, Bld: 504 mg/dL (ref 70–99)
Glucose, Bld: 389 mg/dL — ABNORMAL HIGH (ref 70–99)
Potassium: >7.5 mmol/L (ref 3.5–5.1)
Potassium: <2 mmol/L — CL (ref 3.5–5.1)
Potassium: 3.6 mmol/L (ref 3.5–5.1)
Potassium: 3.5 mmol/L (ref 3.5–5.1)
Sodium: 128 mmol/L — ABNORMAL LOW (ref 135–145)
Sodium: 150 mmol/L — ABNORMAL HIGH (ref 135–145)
Sodium: 150 mmol/L — ABNORMAL HIGH (ref 135–145)
Sodium: 154 mmol/L — ABNORMAL HIGH (ref 135–145)

## 2024-07-21 LAB — POCT I-STAT, CHEM 8
BUN: 55 mg/dL — ABNORMAL HIGH (ref 6–20)
BUN: 54 mg/dL — ABNORMAL HIGH (ref 6–20)
Calcium, Ion: 1.34 mmol/L (ref 1.15–1.40)
Calcium, Ion: 1.35 mmol/L (ref 1.15–1.40)
Chloride: 116 mmol/L — ABNORMAL HIGH (ref 98–111)
Chloride: 116 mmol/L — ABNORMAL HIGH (ref 98–111)
Creatinine, Ser: 3.3 mg/dL — ABNORMAL HIGH (ref 0.61–1.24)
Creatinine, Ser: 2.8 mg/dL — ABNORMAL HIGH (ref 0.61–1.24)
Glucose, Bld: 516 mg/dL (ref 70–99)
Glucose, Bld: 515 mg/dL (ref 70–99)
HCT: 44 % (ref 39.0–52.0)
HCT: 40 % (ref 39.0–52.0)
Hemoglobin: 15 g/dL (ref 13.0–17.0)
Hemoglobin: 13.6 g/dL (ref 13.0–17.0)
Potassium: 2.1 mmol/L — CL (ref 3.5–5.1)
Potassium: 3.3 mmol/L — ABNORMAL LOW (ref 3.5–5.1)
Sodium: 151 mmol/L — ABNORMAL HIGH (ref 135–145)
Sodium: 151 mmol/L — ABNORMAL HIGH (ref 135–145)
TCO2: 20 mmol/L — ABNORMAL LOW (ref 22–32)
TCO2: 17 mmol/L — ABNORMAL LOW (ref 22–32)

## 2024-07-21 LAB — BLOOD GAS, ARTERIAL
Acid-base deficit: 8.8 mmol/L — ABNORMAL HIGH (ref 0.0–2.0)
Acid-base deficit: 11.6 mmol/L — ABNORMAL HIGH (ref 0.0–2.0)
Bicarbonate: 17.4 mmol/L — ABNORMAL LOW (ref 20.0–28.0)
Bicarbonate: 15.1 mmol/L — ABNORMAL LOW (ref 20.0–28.0)
Drawn by: 25770
Drawn by: 25770
FIO2: 100 %
FIO2: 55 %
MECHVT: 600 mL
MECHVT: 520 mL
O2 Saturation: 99.7 %
O2 Saturation: 100 %
PEEP: 5 cmH2O
PEEP: 5 cmH2O
Patient temperature: 36
Patient temperature: 36.7
RATE: 20 {breaths}/min
RATE: 15 {breaths}/min
pCO2 arterial: 36 mmHg (ref 32–48)
pCO2 arterial: 36 mmHg (ref 32–48)
pH, Arterial: 7.28 — ABNORMAL LOW (ref 7.35–7.45)
pH, Arterial: 7.23 — ABNORMAL LOW (ref 7.35–7.45)
pO2, Arterial: 180 mmHg — ABNORMAL HIGH (ref 83–108)
pO2, Arterial: 160 mmHg — ABNORMAL HIGH (ref 83–108)

## 2024-07-21 LAB — GLUCOSE, CAPILLARY
Glucose-Capillary: 137 mg/dL — ABNORMAL HIGH (ref 70–99)
Glucose-Capillary: 195 mg/dL — ABNORMAL HIGH (ref 70–99)
Glucose-Capillary: 282 mg/dL — ABNORMAL HIGH (ref 70–99)
Glucose-Capillary: 323 mg/dL — ABNORMAL HIGH (ref 70–99)
Glucose-Capillary: 355 mg/dL — ABNORMAL HIGH (ref 70–99)
Glucose-Capillary: 376 mg/dL — ABNORMAL HIGH (ref 70–99)
Glucose-Capillary: 437 mg/dL — ABNORMAL HIGH (ref 70–99)
Glucose-Capillary: 453 mg/dL — ABNORMAL HIGH (ref 70–99)
Glucose-Capillary: 488 mg/dL — ABNORMAL HIGH (ref 70–99)
Glucose-Capillary: 509 mg/dL (ref 70–99)
Glucose-Capillary: 514 mg/dL (ref 70–99)
Glucose-Capillary: 565 mg/dL (ref 70–99)
Glucose-Capillary: >600 mg/dL (ref 70–99)
Glucose-Capillary: >600 mg/dL (ref 70–99)
Glucose-Capillary: >600 mg/dL (ref 70–99)

## 2024-07-21 LAB — HEPATIC FUNCTION PANEL
ALT: 22 U/L (ref 0–44)
AST: 35 U/L (ref 15–41)
Albumin: 2.9 g/dL — ABNORMAL LOW (ref 3.5–5.0)
Alkaline Phosphatase: 79 U/L (ref 38–126)
Bilirubin, Direct: 0.1 mg/dL (ref 0.0–0.2)
Indirect Bilirubin: 2.2 mg/dL — ABNORMAL HIGH (ref 0.3–0.9)
Total Bilirubin: 2.3 mg/dL — ABNORMAL HIGH (ref 0.0–1.2)
Total Protein: 5.6 g/dL — ABNORMAL LOW (ref 6.5–8.1)

## 2024-07-21 LAB — BLOOD CULTURE ID PANEL (REFLEXED) - BCID2

## 2024-07-21 LAB — OSMOLALITY: Osmolality: 388 mosm/kg (ref 275–295)

## 2024-07-21 LAB — BETA-HYDROXYBUTYRIC ACID
Beta-Hydroxybutyric Acid: >8 mmol/L — ABNORMAL HIGH (ref 0.05–0.27)
Beta-Hydroxybutyric Acid: >8 mmol/L — ABNORMAL HIGH (ref 0.05–0.27)
Beta-Hydroxybutyric Acid: 3.35 mmol/L — ABNORMAL HIGH (ref 0.05–0.27)
Beta-Hydroxybutyric Acid: 3.57 mmol/L — ABNORMAL HIGH (ref 0.05–0.27)

## 2024-07-21 LAB — CK: Total CK: 1174 U/L — ABNORMAL HIGH (ref 49–397)

## 2024-07-21 LAB — TROPONIN I (HIGH SENSITIVITY)
Troponin I (High Sensitivity): 20 ng/L — ABNORMAL HIGH (ref <18)
Troponin I (High Sensitivity): 12 ng/L (ref <18)

## 2024-07-21 LAB — CBG MONITORING, ED
Glucose-Capillary: >600 mg/dL (ref 70–99)
Glucose-Capillary: >600 mg/dL (ref 70–99)

## 2024-07-21 LAB — CK TOTAL AND CKMB (NOT AT ARMC)
CK, MB: 46.9 ng/mL — ABNORMAL HIGH (ref 0.5–5.0)
Total CK: 1169 U/L — ABNORMAL HIGH (ref 49–397)

## 2024-07-21 LAB — RESP PANEL BY RT-PCR (RSV, FLU A&B, COVID)  RVPGX2
Influenza A by PCR: NEGATIVE
Influenza B by PCR: NEGATIVE
Resp Syncytial Virus by PCR: NEGATIVE
SARS Coronavirus 2 by RT PCR: NEGATIVE

## 2024-07-21 LAB — PROCALCITONIN: Procalcitonin: 0.9 ng/mL

## 2024-07-21 LAB — LACTIC ACID, PLASMA: Lactic Acid, Venous: 1.5 mmol/L (ref 0.5–1.9)

## 2024-07-21 LAB — TSH: TSH: 0.167 u[IU]/mL — ABNORMAL LOW (ref 0.350–4.500)

## 2024-07-21 LAB — HIV ANTIBODY (ROUTINE TESTING W REFLEX): HIV Screen 4th Generation wRfx: NONREACTIVE

## 2024-07-21 LAB — T4, FREE: Free T4: 0.66 ng/dL (ref 0.61–1.12)

## 2024-07-21 MED ORDER — DIPHENHYDRAMINE HCL 50 MG/ML IJ SOLN: 25.0000 mg | Freq: Every day | INTRAMUSCULAR | Status: DC | PRN

## 2024-07-21 MED ORDER — CALCIUM GLUCONATE-NACL 2-0.675 GM/100ML-% IV SOLN
2.0000 g | Freq: Once | INTRAVENOUS | Status: AC
  Administered 2024-07-21: 2000 mg via INTRAVENOUS
  Filled 2024-07-21: qty 100

## 2024-07-21 MED ORDER — PROPOFOL 1000 MG/100ML IV EMUL
0.0000 ug/kg/min | INTRAVENOUS | Status: DC
  Administered 2024-07-21 (×2): 20 ug/kg/min via INTRAVENOUS
  Administered 2024-07-21: 10 ug/kg/min via INTRAVENOUS
  Administered 2024-07-22 – 2024-07-23 (×4): 30 ug/kg/min via INTRAVENOUS
  Administered 2024-07-23: 20 ug/kg/min via INTRAVENOUS
  Administered 2024-07-23: 35 ug/kg/min via INTRAVENOUS
  Administered 2024-07-23: 30 ug/kg/min via INTRAVENOUS
  Administered 2024-07-24: 35 ug/kg/min via INTRAVENOUS
  Administered 2024-07-24: 25 ug/kg/min via INTRAVENOUS
  Filled 2024-07-21 (×14): qty 100

## 2024-07-21 MED ORDER — DEXTROSE 5% FOR FLUSHING BEFORE AND AFTER AMBISOME: 10.0000 mL | INTRAVENOUS | Status: DC

## 2024-07-21 MED ORDER — FENTANYL CITRATE (PF) 50 MCG/ML IJ SOSY
PREFILLED_SYRINGE | INTRAMUSCULAR | Status: AC
  Filled 2024-07-21: qty 2

## 2024-07-21 MED ORDER — ORAL CARE MOUTH RINSE
15.0000 mL | OROMUCOSAL | Status: DC
  Administered 2024-07-21 – 2024-07-25 (×47): 15 mL via OROMUCOSAL

## 2024-07-21 MED ORDER — LACTATED RINGERS IV BOLUS
2000.0000 mL | Freq: Once | INTRAVENOUS | Status: AC
  Administered 2024-07-21: 2000 mL via INTRAVENOUS

## 2024-07-21 MED ORDER — ROCURONIUM BROMIDE 10 MG/ML (PF) SYRINGE
PREFILLED_SYRINGE | INTRAVENOUS | Status: AC
  Administered 2024-07-21: 50 mg
  Filled 2024-07-21: qty 10

## 2024-07-21 MED ORDER — MEPERIDINE HCL 25 MG/ML IJ SOLN: 25.0000 mg | INTRAMUSCULAR | Status: DC | PRN

## 2024-07-21 MED ORDER — NOREPINEPHRINE 4 MG/250ML-% IV SOLN
0.0000 ug/min | INTRAVENOUS | Status: DC
  Administered 2024-07-21: 15 ug/min via INTRAVENOUS
  Administered 2024-07-21: 5 ug/min via INTRAVENOUS
  Administered 2024-07-21: 14 ug/min via INTRAVENOUS
  Administered 2024-07-21: 12 ug/min via INTRAVENOUS
  Administered 2024-07-21: 10 ug/min via INTRAVENOUS
  Administered 2024-07-21: 12 ug/min via INTRAVENOUS
  Administered 2024-07-22 (×2): 15 ug/min via INTRAVENOUS
  Filled 2024-07-21 (×6): qty 250

## 2024-07-21 MED ORDER — PHENYLEPHRINE 80 MCG/ML (10ML) SYRINGE FOR IV PUSH (FOR BLOOD PRESSURE SUPPORT)
PREFILLED_SYRINGE | INTRAVENOUS | Status: DC
  Filled 2024-07-21: qty 10

## 2024-07-21 MED ORDER — ETOMIDATE 2 MG/ML IV SOLN
INTRAVENOUS | Status: AC
  Administered 2024-07-21: 10 mg
  Filled 2024-07-21: qty 20

## 2024-07-21 MED ORDER — FENTANYL 2500MCG IN NS 250ML (10MCG/ML) PREMIX INFUSION
0.0000 ug/h | INTRAVENOUS | Status: DC
  Administered 2024-07-21: 50 ug/h via INTRAVENOUS
  Administered 2024-07-22 (×2): 100 ug/h via INTRAVENOUS
  Administered 2024-07-24: 50 ug/h via INTRAVENOUS
  Filled 2024-07-21 (×4): qty 250

## 2024-07-21 MED ORDER — ACETAMINOPHEN 325 MG PO TABS: 650.0000 mg | ORAL_TABLET | Freq: Every day | ORAL | Status: DC | PRN

## 2024-07-21 MED ORDER — SODIUM BICARBONATE 8.4 % IV SOLN
INTRAVENOUS | Status: AC
  Administered 2024-07-21: 50 meq
  Filled 2024-07-21: qty 50

## 2024-07-21 MED ORDER — ORAL CARE MOUTH RINSE: 15.0000 mL | OROMUCOSAL | Status: DC | PRN

## 2024-07-21 MED ORDER — FENTANYL CITRATE (PF) 50 MCG/ML IJ SOSY
25.0000 ug | PREFILLED_SYRINGE | Freq: Once | INTRAMUSCULAR | Status: AC
  Administered 2024-07-21: 50 ug via INTRAVENOUS
  Filled 2024-07-21: qty 1

## 2024-07-21 MED ORDER — FENTANYL BOLUS VIA INFUSION
25.0000 ug | INTRAVENOUS | Status: DC | PRN
  Administered 2024-07-21: 75 ug via INTRAVENOUS
  Administered 2024-07-21: 100 ug via INTRAVENOUS
  Administered 2024-07-22: 50 ug via INTRAVENOUS
  Administered 2024-07-22: 100 ug via INTRAVENOUS
  Administered 2024-07-22: 50 ug via INTRAVENOUS
  Administered 2024-07-23 (×3): 25 ug via INTRAVENOUS
  Administered 2024-07-23: 100 ug via INTRAVENOUS
  Administered 2024-07-24: 25 ug via INTRAVENOUS
  Administered 2024-07-24 (×6): 50 ug via INTRAVENOUS

## 2024-07-21 MED ORDER — SUCCINYLCHOLINE CHLORIDE 200 MG/10ML IV SOSY
PREFILLED_SYRINGE | INTRAVENOUS | Status: AC
  Filled 2024-07-21: qty 10

## 2024-07-21 MED ORDER — SODIUM CHLORIDE 0.9 % IV BOLUS FOR AMBISOME: 500.0000 mL | INTRAVENOUS | Status: DC

## 2024-07-21 MED ORDER — SODIUM BICARBONATE 8.4 % IV SOLN
INTRAVENOUS | Status: AC
  Filled 2024-07-21: qty 50

## 2024-07-21 MED ORDER — SODIUM CHLORIDE 0.9 % IV SOLN
250.0000 mL | INTRAVENOUS | Status: AC
  Administered 2024-07-21 (×2): 250 mL via INTRAVENOUS

## 2024-07-21 MED ORDER — POTASSIUM CHLORIDE 20 MEQ PO PACK
40.0000 meq | PACK | Freq: Once | ORAL | Status: AC
  Administered 2024-07-21: 40 meq via ORAL
  Filled 2024-07-21: qty 2

## 2024-07-21 MED ORDER — SODIUM BICARBONATE 8.4 % IV SOLN
50.0000 meq | Freq: Once | INTRAVENOUS | Status: AC
  Administered 2024-07-21: 50 meq via INTRAVENOUS
  Filled 2024-07-21: qty 50

## 2024-07-21 MED ORDER — LACTATED RINGERS IV BOLUS
500.0000 mL | Freq: Once | INTRAVENOUS | Status: AC
  Administered 2024-07-21: 500 mL via INTRAVENOUS

## 2024-07-21 MED ORDER — POLYETHYLENE GLYCOL 3350 17 G PO PACK
17.0000 g | PACK | Freq: Every day | ORAL | Status: DC
  Administered 2024-07-22 – 2024-07-25 (×4): 17 g
  Filled 2024-07-21 (×4): qty 1

## 2024-07-21 MED ORDER — DOCUSATE SODIUM 50 MG/5ML PO LIQD
100.0000 mg | Freq: Two times a day (BID) | ORAL | Status: DC
  Administered 2024-07-21 – 2024-07-25 (×8): 100 mg
  Filled 2024-07-21 (×8): qty 10

## 2024-07-21 MED ORDER — DEXTROSE 5 % IV SOLN: 4.0000 mg/kg | INTRAVENOUS | Status: DC

## 2024-07-21 MED ORDER — POTASSIUM CHLORIDE 10 MEQ/100ML IV SOLN
10.0000 meq | INTRAVENOUS | Status: AC
  Administered 2024-07-21 (×3): 10 meq via INTRAVENOUS
  Filled 2024-07-21 (×3): qty 100

## 2024-07-21 MED ORDER — FENTANYL CITRATE (PF) 100 MCG/2ML IJ SOLN
INTRAMUSCULAR | Status: AC
  Filled 2024-07-21: qty 2

## 2024-07-21 MED ORDER — POTASSIUM CHLORIDE 20 MEQ PO PACK
40.0000 meq | PACK | Freq: Once | ORAL | Status: AC
  Administered 2024-07-21: 40 meq
  Filled 2024-07-21: qty 2

## 2024-07-21 MED ORDER — FAMOTIDINE 20 MG PO TABS
20.0000 mg | ORAL_TABLET | Freq: Every day | ORAL | Status: DC
  Administered 2024-07-21 – 2024-07-23 (×3): 20 mg
  Filled 2024-07-21 (×4): qty 1

## 2024-07-21 MED ORDER — SODIUM BICARBONATE 8.4 % IV SOLN
50.0000 meq | Freq: Once | INTRAVENOUS | Status: AC
  Administered 2024-07-21: 50 meq via INTRAVENOUS

## 2024-07-21 MED ORDER — CHLORHEXIDINE GLUCONATE CLOTH 2 % EX PADS
6.0000 | MEDICATED_PAD | Freq: Every day | CUTANEOUS | Status: DC
  Administered 2024-07-21 – 2024-08-11 (×15): 6 via TOPICAL

## 2024-07-21 MED ORDER — HYDROMORPHONE HCL 1 MG/ML IJ SOLN
0.5000 mg | INTRAMUSCULAR | Status: DC | PRN
  Filled 2024-07-21: qty 1

## 2024-07-21 MED ORDER — OXYCODONE HCL 5 MG PO TABS
5.0000 mg | ORAL_TABLET | ORAL | Status: DC | PRN
  Administered 2024-07-21: 5 mg via ORAL
  Filled 2024-07-21: qty 1

## 2024-07-21 MED ORDER — LACTATED RINGERS IV BOLUS
1000.0000 mL | Freq: Once | INTRAVENOUS | Status: AC
  Administered 2024-07-21: 1000 mL via INTRAVENOUS

## 2024-07-21 MED ORDER — PHENYLEPHRINE 80 MCG/ML (10ML) SYRINGE FOR IV PUSH (FOR BLOOD PRESSURE SUPPORT)
PREFILLED_SYRINGE | INTRAVENOUS | Status: AC
  Filled 2024-07-21: qty 10

## 2024-07-21 MED ORDER — DIPHENHYDRAMINE HCL 25 MG PO CAPS: 25.0000 mg | ORAL_CAPSULE | Freq: Every day | ORAL | Status: DC | PRN

## 2024-07-21 MED ORDER — LACTATED RINGERS IV BOLUS: 1000.0000 mL | Freq: Once | INTRAVENOUS | Status: DC

## 2024-07-21 MED ORDER — LACTATED RINGERS IV SOLN: INTRAVENOUS | Status: DC

## 2024-07-21 NOTE — ED Notes (Signed)
 Patient was moaning and grimacing as though he was hurting, pulled IV pain meds. Scanned and opened them, however patients BP was dropping so I was uneasy about giving them. Due to the package being opened, I had to waste them. Will waste in med room with another Nurse or Charge, If pressure comes back up, I will give them or let next Nurse know.

## 2024-07-21 NOTE — Progress Notes (Signed)
 K 7.9, think the original was probably in error. Giving some calcium 

## 2024-07-21 NOTE — Progress Notes (Signed)
 Stopped insulin  gtts per MD due to critical low potassium.  Will monitor and restart insulin  when potassium returns to safe level.

## 2024-07-21 NOTE — Procedures (Signed)
 Arterial Catheter Insertion Procedure Note  TAVIN VERNET  [id number]  1966/06/12  Date:07/21/24  Time:8:50 AM    Provider Performing: Benton D. Harris    Procedure: Insertion of Arterial Line (63379) with US  guidance (23062)   Indication(s) Blood pressure monitoring and/or need for frequent ABGs  Consent Unable to obtain consent due to emergent nature of procedure.  Anesthesia None   Time Out Verified patient identification, verified procedure, site/side was marked, verified correct patient position, special equipment/implants available, medications/allergies/relevant history reviewed, required imaging and test results available.   Sterile Technique Maximal sterile technique including full sterile barrier drape, hand hygiene, sterile gown, sterile gloves, mask, hair covering, sterile ultrasound probe cover (if used).   Procedure Description Area of catheter insertion was cleaned with chlorhexidine  and draped in sterile fashion. With real-time ultrasound guidance an arterial catheter was placed into the right femoral artery.  Appropriate arterial tracings confirmed on monitor.     Complications/Tolerance None; patient tolerated the procedure well.   EBL Minimal   Specimen(s) None  Jazman Reuter D. Harris, NP-C [facility] Personal contact information can be found on Amion  If no contact or response made please call [phone] 07/21/2024, 8:50 AM

## 2024-07-21 NOTE — Progress Notes (Signed)
Notified Lab that ABG being sent for analysis. 

## 2024-07-21 NOTE — Progress Notes (Signed)
 Pt arrived brought by EMS @ 0600. Pt was unresponsive to any stimuli and unable to follow commands. Decompensating. BP decreasing with a MAP of 50. CBG >600. Levo maxed out. Elink called in room and Dr. Arrived via telebox camera. Orders were to trendelenburg, give 2 bolus' of LR to help stable BP. 1 amp of sodium bicarb administered. Notified that ground team was aware and would be by for possible intubation. Keep trendelenburg and monitor. Assisted by Artist Kays, RN and passed on to dayshift.

## 2024-07-21 NOTE — Procedures (Signed)
;  Central Venous Catheter Insertion Procedure Note  PETAR MUCCI  [id number]  1965/11/30  Date:07/21/24  Time:8:50 AM   Provider Performing:Tiandre Teall D. Harris   Procedure: Insertion of Non-tunneled Central Venous Catheter(36556) with US  guidance (23062)   Indication(s) Medication administration  Consent Unable to obtain consent due to emergent nature of procedure.  Anesthesia Topical only with 1% lidocaine   Timeout Verified patient identification, verified procedure, site/side was marked, verified correct patient position, special equipment/implants available, medications/allergies/relevant history reviewed, required imaging and test results available.  Sterile Technique Maximal sterile technique including full sterile barrier drape, hand hygiene, sterile gown, sterile gloves, mask, hair covering, sterile ultrasound probe cover (if used).  Procedure Description Area of catheter insertion was cleaned with chlorhexidine  and draped in sterile fashion.  With real-time ultrasound guidance a central venous catheter was placed into the left femoral vein. Nonpulsatile blood flow and easy flushing noted in all ports.  The catheter was sutured in place and sterile dressing applied.  Complications/Tolerance None; patient tolerated the procedure well. Chest X-ray is ordered to verify placement for internal jugular or subclavian cannulation.   Chest x-ray is not ordered for femoral cannulation.  EBL Minimal  Specimen(s) None Julion Gatt D. Harris, NP-C [facility] Personal contact information can be found on Amion  If no contact or response made please call [phone] 07/21/2024, 8:51 AM

## 2024-07-21 NOTE — Progress Notes (Signed)
 07/21/2024 After my eval apparently BP down as is mentation:  DC all sedating meds Recheck lactate, ABG, cbc, bmp Additional IVF ordered Mucor sinusitis suggested in differential, will relay to day team: if A1c isn't bad wouldn't think he'd be immunocompromised enough to get this and toxicities of meds could be pretty bad Will have day team round on him first  Rolan Sharps MD PCCM

## 2024-07-21 NOTE — Progress Notes (Signed)
 eLink Physician-Brief Progress Note Patient Name: Jorge Calhoun DOB: Jul 20, 1966 MRN: [id number]   Date of Service  07/21/2024  HPI/Events of Note  58 year old male with a presented to the emergency department with acute encephalopathy in the setting of diabetic ketoacidosis in the setting of new diarrheal illness and type 1 diabetes.  On present to the floor, the patient was on norepinephrine  at maximal peripheral dosing after receiving 1 L of LR at outside emergency department.  Patient responded to Trendelenburg positioning and IV fluids.  Vitals reviewed, sugars reviewed and last metabolic panel still shows hyperkalemia and anion gap metabolic acidosis.  Questionable sinusitis.  eICU Interventions  Additional 2 L of crystalloid resuscitation.  1 ampoule of bicarb given severe acidemia and persistent hyperglycemia.  Increase the ceiling of norepinephrine  temporarily.  Standard DKA protocol-insulin , IV hydration, and electrolyte repletion.  Anticipate some degree of hypovolemic shock, but if he does not improve with volume resuscitation. Will add Mucor coverage until the patient can be reevaluated at bedside.  Ground team/day team to be notified about obtundation and evaluation for airway  DVT prophylaxis with heparin  GI prophylaxis not indicated     Intervention Category Intermediate Interventions: Hypotension - evaluation and management Evaluation Type: New Patient Evaluation  Orman Matsumura 07/21/2024, 6:28 AM

## 2024-07-21 NOTE — Procedures (Signed)
 Intubation Procedure Note  Jorge Calhoun  [id number]  07/10/1966  Date:07/21/24  Time:8:49 AM   Provider Performing:Maxim Bedel D. Harris    Procedure: Intubation (31500)  Indication(s) Respiratory Failure  Consent Risks of the procedure as well as the alternatives and risks of each were explained to the patient and/or caregiver.  Consent for the procedure was obtained and is signed in the bedside chart   Anesthesia Etomidate  and Rocuronium    Time Out Verified patient identification, verified procedure, site/side was marked, verified correct patient position, special equipment/implants available, medications/allergies/relevant history reviewed, required imaging and test results available.   Sterile Technique Usual hand hygeine, masks, and gloves were used   Procedure Description Patient positioned in bed supine.  Sedation given as noted above.  Patient was intubated with endotracheal tube using Glidescope.  View was Grade 2 only posterior commissure .  Number of attempts was 1.  Colorimetric CO2 detector was consistent with tracheal placement.   Complications/Tolerance None; patient tolerated the procedure well. Chest X-ray is ordered to verify placement.   EBL Minimal   Specimen(s) None  Jorge Calhoun D. Harris, NP-C [facility] Personal contact information can be found on Amion  If no contact or response made please call [phone] 07/21/2024, 8:49 AM

## 2024-07-21 NOTE — Progress Notes (Signed)
 Vital signs filed for this patient. Patient arrived meantime emergency with other patient. [place] [employer] RN resumed care for this patient until Day shift arrived.

## 2024-07-21 NOTE — Progress Notes (Signed)
 PHARMACY - PHYSICIAN COMMUNICATION CRITICAL VALUE ALERT - BLOOD CULTURE IDENTIFICATION (BCID)  Jorge Calhoun is an 58 y.o. male who presented to [facility] on 07/20/2024 with a chief complaint of poor PO, N/V/D, DKA   Assessment:  Strep in 1 out 4 [facility]  Name of physician (or Provider) ContactedBETHA Epley  Current antibiotics: Unasyn   Changes to prescribed antibiotics recommended:  Patient is on recommended antibiotics - No changes needed  Results for orders placed or performed during the hospital encounter of 07/20/24  Blood Culture ID Panel (Reflexed) (Collected: 07/20/2024 10:38 PM)  Result Value Ref Range   Enterococcus faecalis NOT DETECTED NOT DETECTED   Enterococcus Faecium NOT DETECTED NOT DETECTED   Listeria monocytogenes NOT DETECTED NOT DETECTED   Staphylococcus species NOT DETECTED NOT DETECTED   Staphylococcus aureus (BCID) NOT DETECTED NOT DETECTED   Staphylococcus epidermidis NOT DETECTED NOT DETECTED   Staphylococcus lugdunensis NOT DETECTED NOT DETECTED   Streptococcus species DETECTED (A) NOT DETECTED   Streptococcus agalactiae NOT DETECTED NOT DETECTED   Streptococcus pneumoniae NOT DETECTED NOT DETECTED   Streptococcus pyogenes NOT DETECTED NOT DETECTED   A.calcoaceticus-baumannii NOT DETECTED NOT DETECTED   Bacteroides fragilis NOT DETECTED NOT DETECTED   Enterobacterales NOT DETECTED NOT DETECTED   Enterobacter cloacae complex NOT DETECTED NOT DETECTED   Escherichia coli NOT DETECTED NOT DETECTED   Klebsiella aerogenes NOT DETECTED NOT DETECTED   Klebsiella oxytoca NOT DETECTED NOT DETECTED   Klebsiella pneumoniae NOT DETECTED NOT DETECTED   Proteus species NOT DETECTED NOT DETECTED   Salmonella species NOT DETECTED NOT DETECTED   Serratia marcescens NOT DETECTED NOT DETECTED   Haemophilus influenzae NOT DETECTED NOT DETECTED   Neisseria meningitidis NOT DETECTED NOT DETECTED   Pseudomonas aeruginosa NOT DETECTED NOT DETECTED   Stenotrophomonas  maltophilia NOT DETECTED NOT DETECTED   Candida albicans NOT DETECTED NOT DETECTED   Candida auris NOT DETECTED NOT DETECTED   Candida glabrata NOT DETECTED NOT DETECTED   Candida krusei NOT DETECTED NOT DETECTED   Candida parapsilosis NOT DETECTED NOT DETECTED   Candida tropicalis NOT DETECTED NOT DETECTED   Cryptococcus neoformans/gattii NOT DETECTED NOT DETECTED   Abella Shugart Karoline Marina, PharmD, BCPS Clinical Staff Pharmacist [place], Seth Stillinger 07/21/2024  6:07 PM

## 2024-07-21 NOTE — Progress Notes (Addendum)
 NAME:  Jorge Calhoun, MRN:  [id number], DOB:  1966/04/08, LOS: 1 ADMISSION DATE:  07/20/2024, CONSULTATION DATE:  07/20/24 REFERRING MD:  EDP, CHIEF COMPLAINT:  AMS   History of Present Illness:  58 year old man w/ type 1 DM presenting with 3-4 days of feeling generally unwell, poor PO, N/V.  Went to [place] yesterday and given something for an infection.  Today found unresponsive at home, workup in ER neg head imaging, labs with profound hyperglycemia, DKA.  PCCM to admit given degree of AMS and hyperglycemia.  Has come around a little with IVF.  Having some back pain but this is chronic.  Was given morphine  before I saw him so he is pretty out again.  Spouse at bedside to corroborate hx.  + sinus problems but chronic, no clear fevers/chills.  Full 14 point ROS + for confusion, fatigue, poor PO, N/V.  Care everywhere reviewed: [facility]:  Assessment: 1. Acute bronchitis  Plan: 1. Rx Zofran  ODT, Augmentin 875, Mucinex ER, chlorpheniramine and Tessalon  Perles. Work excuse given to return to work on 12   Pertinent  Medical History  HTN DM  Significant Hospital Events: Including procedures, antibiotic start and stop dates in addition to other pertinent events   12/4 admit in DKA, RVP panel positive for rhinovirus/enterovirus 12/5 progressive decline in mentation with eventual development of unresponsiveness intubated for airway protection with central line and A-line placed as well  Interim History / Subjective:  As above  Objective    Blood pressure (!) 97/45, pulse 99, temperature (!) 96.8 F (36 C), temperature source Axillary, resp. rate 15, height 5' 11 (1.803 m), weight 108.9 kg, SpO2 96%.    Vent Mode: PRVC FiO2 (%):  [85 %-100 %] 85 % Set Rate:  [20 bmp] 20 bmp Vt Set:  [600 mL] 600 mL PEEP:  [5 cmH20] 5 cmH20 Plateau Pressure:  [18 cmH20] 18 cmH20   Intake/Output Summary (Last 24 hours) at 07/21/2024 0857 Last data filed at 07/21/2024 9360 Gross per 24 hour   Intake 2259.25 ml  Output --  Net 2259.25 ml   Filed Weights   07/20/24 2017  Weight: 108.9 kg    Examination: General: Acute ill-appearing middle-age male lying in bed unresponsive male on mechanical ventilation HEENT: ETT, MM pink/moist, PERRL,  Neuro: Unresponsive CV: s1s2 regular rate and rhythm, no murmur, rubs, or gallops,  PULM: Clear to auscultation, no increased work of breathing, thick yellow secretions suctioned prior to intubation GI: soft, bowel sounds active in all 4 quadrants, non-tender, non-distended Extremities: warm/dry, no edema  Skin: no rashes or lesions   Resolved problem list   Assessment and Plan  Evolving shock likely septic versus hypovolemic - Appears patient initially presented with normal vital signs however overnight patient progressively became tachycardic, hypotensive, and hypothermic.  With leukocytosis, lactic acid currently pending.  Infection sources include potential acute sinusitis in the setting of viral illness versus pneumonia (thick yellow secretions suctioned from oropharynx prior to intubation) P: Continue Unasyn  Follow cultures Continue pressors, greater than 65 Trend lactic acid Monitor urine output IV resuscitation   Inability to protect airway given altered mental status -Shortly upon a.m. assessment patient was intubated for airway protection given unresponsiveness P: Continue ventilator support with lung protective strategies  Wean PEEP and FiO2 for sats greater than 90%. Head of bed elevated 30 degrees. Plateau pressures less than 30 cm H20.  Follow intermittent chest x-ray and ABG.   SAT/SBT as tolerated, mentation preclude extubation  Ensure adequate pulmonary hygiene  Follow cultures  VAP bundle in place  PAD protocol  Acute metabolic encephalopathy -Overnight VBG with pH 6.9, CO2 at 15.3 and bicarb 3.8 by a.m. 12/5 patient remained unresponsive.  CT head unremarkable P: Progressive management of metabolic  arrangements Maintain neuro protective measures; goal for eurothermia, euglycemia, eunatermia, normoxia, and PCO2 goal of 35-40 Nutrition and bowel regimen Seizure precautions  Aspirations precautions   Diabetic ketoacidosis with history of type 1 diabetes - Venous pH 6.9, CO2 15.3, bicarb 8.3 with altered mental status/unresponsiveness shortly after admission indicating severe DKA.  Spouse states patient is very compliant with insulin  regiment therefore DKA episode likely related to viral illness P: Continue aggressive IV hydration  Continue insulin  drip Closely monitor patient's electrolytes Accu-Cheks every 1 hour Once blood glucose falls below 250 start patient on D5 IV fluids Monitor renal function If repeat ABG with pH less than 7 start bicarb drip Monitor lactate  Acute kidney injury - Presumed secondary to significant dehydration in the setting of DKA.  Creatinine 3.82 with GFR 17 on admission compared to creatinine 1.16 with GFR 73 June 2024 Mild rhabdomyolysis -CK 1,174 on admission P: Follow renal function  Monitor urine output Trend Bmet Avoid nephrotoxin Ensure adequate renal perfusion  IV hydration  Hypernatremia (corrected 155-163) P: IV resuscitation  Sinusitis query acute P: IV Unasyn  Avoid NG tube  Essential hypertension P: Currently hypotensive in the setting of shock state, hold home antihypertensives  Lipase mildly up P: IV resuscitation Consider ultrasound  Critical care time:   CRITICAL CARE Performed by: Micole Delehanty D. Harris   Total critical care time: 45 minutes  Critical care time was exclusive of separately billable procedures and treating other patients.  Critical care was necessary to treat or prevent imminent or life-threatening deterioration.  Critical care was time spent personally by me on the following activities: development of treatment plan with patient and/or surrogate as well as nursing, discussions with consultants,  evaluation of patient's response to treatment, examination of patient, obtaining history from patient or surrogate, ordering and performing treatments and interventions, ordering and review of laboratory studies, ordering and review of radiographic studies, pulse oximetry and re-evaluation of patient's condition.  Lenetta Piche D. Harris, NP-C [facility] Personal contact information can be found on Amion  If no contact or response made please call [phone] 07/21/2024, 9:13 AM

## 2024-07-21 NOTE — Progress Notes (Signed)
 Paused potassium for 15 min to get BMP per provider to evaluate blood glucose and improving potassium levels.

## 2024-07-21 NOTE — Progress Notes (Signed)
 Brief PCCM Progress Note   Repeat Bmet now with severe hypokalemia with K less than 2. Given drastic change from prior an ISTAT chem was collected and confirmed significant hypokalemia with K 2.1. Insulin  drip was stopped and both IV and PO potassium supplementation was ordered.   Jorge Demond D. Harris, NP-C [facility] Personal contact information can be found on Amion  If no contact or response made please call [phone] 07/21/2024, 10:35 AM

## 2024-07-21 NOTE — Plan of Care (Signed)
 Patient electrolytes and glucose has been very unstable today.  Latter portion of shift has been improving.  Patient vitals remain stable at this point and patient is showing some signs of metal status improvement.

## 2024-07-21 NOTE — Progress Notes (Signed)
 Ordered sputum collected and sent to Lab - RN aware.

## 2024-07-21 NOTE — Progress Notes (Addendum)
 Concern with exhaled tidal volumes  Circuit looks intact, balloon is intact  Decreased his tidal volume and rate    ABG and iStat

## 2024-07-22 DIAGNOSIS — R6521 Severe sepsis with septic shock: Secondary | ICD-10-CM

## 2024-07-22 LAB — GLUCOSE, CAPILLARY
Glucose-Capillary: 122 mg/dL — ABNORMAL HIGH (ref 70–99)
Glucose-Capillary: 159 mg/dL — ABNORMAL HIGH (ref 70–99)
Glucose-Capillary: 170 mg/dL — ABNORMAL HIGH (ref 70–99)
Glucose-Capillary: 174 mg/dL — ABNORMAL HIGH (ref 70–99)
Glucose-Capillary: 176 mg/dL — ABNORMAL HIGH (ref 70–99)
Glucose-Capillary: 176 mg/dL — ABNORMAL HIGH (ref 70–99)
Glucose-Capillary: 182 mg/dL — ABNORMAL HIGH (ref 70–99)
Glucose-Capillary: 184 mg/dL — ABNORMAL HIGH (ref 70–99)
Glucose-Capillary: 200 mg/dL — ABNORMAL HIGH (ref 70–99)
Glucose-Capillary: 223 mg/dL — ABNORMAL HIGH (ref 70–99)
Glucose-Capillary: 234 mg/dL — ABNORMAL HIGH (ref 70–99)
Glucose-Capillary: 237 mg/dL — ABNORMAL HIGH (ref 70–99)
Glucose-Capillary: 243 mg/dL — ABNORMAL HIGH (ref 70–99)
Glucose-Capillary: 243 mg/dL — ABNORMAL HIGH (ref 70–99)

## 2024-07-22 LAB — COMPREHENSIVE METABOLIC PANEL WITH GFR
ALT: 25 U/L (ref 0–44)
AST: 53 U/L — ABNORMAL HIGH (ref 15–41)
Albumin: 3 g/dL — ABNORMAL LOW (ref 3.5–5.0)
Alkaline Phosphatase: 76 U/L (ref 38–126)
Anion gap: 10 (ref 5–15)
BUN: 45 mg/dL — ABNORMAL HIGH (ref 6–20)
CO2: 25 mmol/L (ref 22–32)
Calcium: 9 mg/dL (ref 8.9–10.3)
Chloride: 122 mmol/L — ABNORMAL HIGH (ref 98–111)
Creatinine, Ser: 2.51 mg/dL — ABNORMAL HIGH (ref 0.61–1.24)
GFR, Estimated: 29 mL/min — ABNORMAL LOW (ref >60)
Glucose, Bld: 183 mg/dL — ABNORMAL HIGH (ref 70–99)
Potassium: 3.8 mmol/L (ref 3.5–5.1)
Sodium: 156 mmol/L — ABNORMAL HIGH (ref 135–145)
Total Bilirubin: 0.3 mg/dL (ref 0.0–1.2)
Total Protein: 5 g/dL — ABNORMAL LOW (ref 6.5–8.1)

## 2024-07-22 LAB — BLOOD GAS, ARTERIAL
Acid-Base Excess: 2 mmol/L (ref 0.0–2.0)
Bicarbonate: 27.3 mmol/L (ref 20.0–28.0)
Drawn by: 560031
FIO2: 30 %
MECHVT: 520 mL
O2 Saturation: 99.5 %
PEEP: 5 cmH2O
Patient temperature: 38.3
RATE: 15 {breaths}/min
pCO2 arterial: 47 mmHg (ref 32–48)
pH, Arterial: 7.38 (ref 7.35–7.45)
pO2, Arterial: 102 mmHg (ref 83–108)

## 2024-07-22 LAB — BASIC METABOLIC PANEL WITH GFR
Anion gap: 10 (ref 5–15)
Anion gap: 10 (ref 5–15)
BUN: 52 mg/dL — ABNORMAL HIGH (ref 6–20)
BUN: 37 mg/dL — ABNORMAL HIGH (ref 6–20)
CO2: 24 mmol/L (ref 22–32)
CO2: 26 mmol/L (ref 22–32)
Calcium: 8.7 mg/dL — ABNORMAL LOW (ref 8.9–10.3)
Calcium: 8.6 mg/dL — ABNORMAL LOW (ref 8.9–10.3)
Chloride: 121 mmol/L — ABNORMAL HIGH (ref 98–111)
Chloride: 120 mmol/L — ABNORMAL HIGH (ref 98–111)
Creatinine, Ser: 2.88 mg/dL — ABNORMAL HIGH (ref 0.61–1.24)
Creatinine, Ser: 2.03 mg/dL — ABNORMAL HIGH (ref 0.61–1.24)
GFR, Estimated: 24 mL/min — ABNORMAL LOW (ref >60)
GFR, Estimated: 37 mL/min — ABNORMAL LOW (ref >60)
Glucose, Bld: 153 mg/dL — ABNORMAL HIGH (ref 70–99)
Glucose, Bld: 245 mg/dL — ABNORMAL HIGH (ref 70–99)
Potassium: 3.6 mmol/L (ref 3.5–5.1)
Potassium: 3.4 mmol/L — ABNORMAL LOW (ref 3.5–5.1)
Sodium: 155 mmol/L — ABNORMAL HIGH (ref 135–145)
Sodium: 156 mmol/L — ABNORMAL HIGH (ref 135–145)

## 2024-07-22 LAB — CBC
HCT: 40.2 % (ref 39.0–52.0)
Hemoglobin: 14 g/dL (ref 13.0–17.0)
MCH: 31.5 pg (ref 26.0–34.0)
MCHC: 34.8 g/dL (ref 30.0–36.0)
MCV: 90.5 fL (ref 80.0–100.0)
Platelets: 140 K/uL — ABNORMAL LOW (ref 150–400)
RBC: 4.44 MIL/uL (ref 4.22–5.81)
RDW: 14 % (ref 11.5–15.5)
WBC: 15.1 K/uL — ABNORMAL HIGH (ref 4.0–10.5)
nRBC: 0.6 % — ABNORMAL HIGH (ref 0.0–0.2)

## 2024-07-22 LAB — BETA-HYDROXYBUTYRIC ACID
Beta-Hydroxybutyric Acid: 1.12 mmol/L — ABNORMAL HIGH (ref 0.05–0.27)
Beta-Hydroxybutyric Acid: 7.08 mmol/L — ABNORMAL HIGH (ref 0.05–0.27)

## 2024-07-22 LAB — HEMOGLOBIN A1C
Hgb A1c MFr Bld: 8.8 % — ABNORMAL HIGH (ref 4.8–5.6)
Mean Plasma Glucose: 206 mg/dL

## 2024-07-22 LAB — CK TOTAL AND CKMB (NOT AT ARMC)
CK, MB: 2.1 ng/mL (ref 0.5–5.0)
Total CK: 68 U/L (ref 49–397)

## 2024-07-22 LAB — TRIGLYCERIDES: Triglycerides: 174 mg/dL — ABNORMAL HIGH (ref <150)

## 2024-07-22 LAB — MAGNESIUM: Magnesium: 2.4 mg/dL (ref 1.7–2.4)

## 2024-07-22 MED ORDER — INSULIN ASPART 100 UNIT/ML IJ SOLN
0.0000 [IU] | INTRAMUSCULAR | Status: DC
  Administered 2024-07-22 (×2): 5 [IU] via SUBCUTANEOUS
  Administered 2024-07-23: 3 [IU] via SUBCUTANEOUS
  Administered 2024-07-23 (×5): 5 [IU] via SUBCUTANEOUS
  Administered 2024-07-23: 3 [IU] via SUBCUTANEOUS
  Administered 2024-07-24 (×6): 5 [IU] via SUBCUTANEOUS
  Administered 2024-07-25: 2 [IU] via SUBCUTANEOUS
  Administered 2024-07-25 (×2): 5 [IU] via SUBCUTANEOUS
  Administered 2024-07-25 (×2): 3 [IU] via SUBCUTANEOUS
  Administered 2024-07-26: 2 [IU] via SUBCUTANEOUS
  Administered 2024-07-26 (×2): 3 [IU] via SUBCUTANEOUS
  Filled 2024-07-22 (×4): qty 5
  Filled 2024-07-22: qty 3
  Filled 2024-07-22 (×2): qty 5
  Filled 2024-07-22: qty 3
  Filled 2024-07-22 (×3): qty 5
  Filled 2024-07-22: qty 3
  Filled 2024-07-22 (×2): qty 2
  Filled 2024-07-22: qty 3
  Filled 2024-07-22: qty 5
  Filled 2024-07-22: qty 1
  Filled 2024-07-22: qty 5
  Filled 2024-07-22: qty 3
  Filled 2024-07-22: qty 2
  Filled 2024-07-22: qty 3
  Filled 2024-07-22 (×3): qty 5

## 2024-07-22 MED ORDER — SODIUM CHLORIDE 0.9 % IV SOLN: INTRAVENOUS | Status: AC | PRN

## 2024-07-22 MED ORDER — SODIUM CHLORIDE 0.9 % IV SOLN
2.0000 g | INTRAVENOUS | Status: DC
  Administered 2024-07-22 – 2024-07-26 (×5): 2 g via INTRAVENOUS
  Filled 2024-07-22 (×5): qty 20

## 2024-07-22 MED ORDER — FREE WATER
300.0000 mL | Status: DC
  Administered 2024-07-22 – 2024-07-23 (×5): 300 mL

## 2024-07-22 MED ORDER — INSULIN GLARGINE 100 UNIT/ML ~~LOC~~ SOLN
10.0000 [IU] | Freq: Two times a day (BID) | SUBCUTANEOUS | Status: DC
  Administered 2024-07-22 – 2024-07-26 (×9): 10 [IU] via SUBCUTANEOUS
  Filled 2024-07-22 (×10): qty 0.1

## 2024-07-22 MED ORDER — FREE WATER
200.0000 mL | Status: DC
  Administered 2024-07-22 (×3): 200 mL

## 2024-07-22 MED ORDER — DEXTROSE IN LACTATED RINGERS 5 % IV SOLN: INTRAVENOUS | Status: DC

## 2024-07-22 MED ORDER — SODIUM CHLORIDE 0.9 % IV SOLN
3.0000 g | Freq: Four times a day (QID) | INTRAVENOUS | Status: DC
  Administered 2024-07-22 (×2): 3 g via INTRAVENOUS
  Filled 2024-07-22 (×2): qty 8

## 2024-07-22 MED ORDER — SODIUM CHLORIDE 0.45 % IV SOLN: INTRAVENOUS | Status: DC

## 2024-07-22 NOTE — Progress Notes (Signed)
 NAME:  Jorge Calhoun, MRN:  [id number], DOB:  [birth year], LOS: 2 ADMISSION DATE:  07/20/2024, CONSULTATION DATE:  07/20/24 REFERRING MD:  EDP, CHIEF COMPLAINT:  AMS   History of Present Illness:  58 year old man w/ type 1 DM presenting with 3-4 days of feeling generally unwell, poor PO, N/V.  Went to [place] yesterday and given something for an infection.  Today found unresponsive at home, workup in ER neg head imaging, labs with profound hyperglycemia, DKA.  PCCM to admit given degree of AMS and hyperglycemia.  Has come around a little with IVF.  Having some back pain but this is chronic.  Was given morphine  before I saw him so he is pretty out again.  Spouse at bedside to corroborate hx.  + sinus problems but chronic, no clear fevers/chills.  Full 14 point ROS + for confusion, fatigue, poor PO, N/V.  Care everywhere reviewed: [facility]:  Assessment: 1. Acute bronchitis  Plan: 1. Rx Zofran  ODT, Augmentin 875, Mucinex ER, chlorpheniramine and Tessalon  Perles. Work excuse given to return to work on 12   Pertinent  Medical History  HTN DM  Significant Hospital Events: Including procedures, antibiotic start and stop dates in addition to other pertinent events   12/4 admit in DKA, RVP panel positive for rhinovirus/enterovirus 12/5 progressive decline in mentation with eventual development of unresponsiveness intubated for airway protection with central line and A-line placed as well  Interim History / Subjective:  Got very agitated this morning when we tried to wean down his sedation He is not redirectable, staring blankly into space, moving all extremities - Got sweaty pretty quickly, significant tachycardia, tachypnea Was tolerating CPAP with pressure support  Objective    Blood pressure 127/74, pulse (!) 113, temperature (!) 101.8 F (38.8 C), temperature source Axillary, resp. rate 13, height 5' 11 (1.803 m), weight 104.2 kg, SpO2 97%.    Vent Mode: PRVC FiO2 (%):   [30 %-100 %] 30 % Set Rate:  [15 bmp-20 bmp] 15 bmp Vt Set:  [520 mL-600 mL] 520 mL PEEP:  [5 cmH20] 5 cmH20 Plateau Pressure:  [9 cmH20-18 cmH20] 13 cmH20   Intake/Output Summary (Last 24 hours) at 07/22/2024 0727 Last data filed at 07/22/2024 9349 Gross per 24 hour  Intake 6429.77 ml  Output 2675 ml  Net 3754.77 ml   Filed Weights   07/20/24 2017 07/22/24 0500  Weight: 108.9 kg 104.2 kg    Examination: General: Acutely ill-appearing HEENT: Moist oral mucosa Neuro: He aroused and moving all extremities, was not redirectable  CV: s1s2 reg S1-S2 appreciated, no murmur PULM: Clear to auscultation  GI: Soft, bowel sounds appreciated Extremities: Skin is warm and dry Skin: no rashes or lesions  I reviewed last 24 h vitals and pain scores, last 48 h intake and output, last 24 h labs and trends, and last 24 h imaging results.  Hypernatremia Sodium 156, chloride of 122, BUN of 45, creatinine of 2.51 ABG 7.38/47/102 Resolved problem list   Assessment and Plan   Septic shock Concern for pneumonia -May have had a viral illness keeping him into DKA -Continue Unasyn  - Continue to follow cultures - Wean pressors as tolerated - Monitor I/O  Altered mental status Metabolic encephalopathy Inability to protect airway - Continue mechanical ventilation  -Target TVol 6-8cc/kgIBW -Target Plateau Pressure < 30cm H20 -Target driving pressure less than 15 cm of water  -Ventilator associated pneumonia prevention protocol  Severe metabolic encephalopathy with pH of 6.9, this has improved -  pH has normalized to 7.38  Diabetic ketoacidosis with a history of type 1 diabetes - On insulin  - Bicarb is normalized, gap is closed - Will transition to subcu insulin   Acute kidney injury - Continue fluid resuscitation - Avoid nephrotoxic medications - Maintain renal perfusion  Hypernatremia - Will switch fluids to half-normal saline  Sinusitis - Continue Unasyn   Hypertension -  Continue to monitor  Still requires significant sedation as his level of agitation is uncontrolled at present without sedation  updated spouse at bedside  The patient is critically ill with multiple organ systems failure and requires high complexity decision making for assessment and support, frequent evaluation and titration of therapies, application of advanced monitoring technologies and extensive interpretation of multiple databases. Critical Care Time devoted to patient care services described in this note independent of APP/resident time (if applicable)  is 40 minutes.   Jennet Epley MD [facility] Personal pager: See Amion If unanswered, please page CCM On-call: [phone]

## 2024-07-22 NOTE — Progress Notes (Signed)
 Blood cultures showing strep viridans in 1 out of 4 bottles  Will order repeat blood cultures  Echocardiogram ordered   Switch from Unasyn  to Rocephin 

## 2024-07-23 ENCOUNTER — Other Ambulatory Visit: Payer: Self-pay

## 2024-07-23 ENCOUNTER — Inpatient Hospital Stay (HOSPITAL_COMMUNITY)

## 2024-07-23 LAB — ECHOCARDIOGRAM COMPLETE
AR max vel: 2.96 cm2
AV Area VTI: 2.8 cm2
AV Area mean vel: 2.52 cm2
AV Mean grad: 4 mmHg
AV Peak grad: 5.9 mmHg
Ao pk vel: 1.21 m/s
Area-P 1/2: 5.37 cm2
Height: 71 in
S' Lateral: 2.7 cm
Weight: 3675.51 [oz_av]

## 2024-07-23 LAB — POCT I-STAT, CHEM 8
BUN: 26 mg/dL — ABNORMAL HIGH (ref 6–20)
Calcium, Ion: 1.25 mmol/L (ref 1.15–1.40)
Chloride: 118 mmol/L — ABNORMAL HIGH (ref 98–111)
Creatinine, Ser: 1.3 mg/dL — ABNORMAL HIGH (ref 0.61–1.24)
Glucose, Bld: 216 mg/dL — ABNORMAL HIGH (ref 70–99)
HCT: 37 % — ABNORMAL LOW (ref 39.0–52.0)
Hemoglobin: 12.6 g/dL — ABNORMAL LOW (ref 13.0–17.0)
Potassium: 3.1 mmol/L — ABNORMAL LOW (ref 3.5–5.1)
Sodium: 159 mmol/L — ABNORMAL HIGH (ref 135–145)
TCO2: 28 mmol/L (ref 22–32)

## 2024-07-23 LAB — BASIC METABOLIC PANEL WITH GFR
Anion gap: 9 (ref 5–15)
Anion gap: 10 (ref 5–15)
BUN: 28 mg/dL — ABNORMAL HIGH (ref 6–20)
BUN: 22 mg/dL — ABNORMAL HIGH (ref 6–20)
CO2: 28 mmol/L (ref 22–32)
CO2: 30 mmol/L (ref 22–32)
Calcium: 8.5 mg/dL — ABNORMAL LOW (ref 8.9–10.3)
Calcium: 8.4 mg/dL — ABNORMAL LOW (ref 8.9–10.3)
Chloride: 123 mmol/L — ABNORMAL HIGH (ref 98–111)
Chloride: 119 mmol/L — ABNORMAL HIGH (ref 98–111)
Creatinine, Ser: 1.6 mg/dL — ABNORMAL HIGH (ref 0.61–1.24)
Creatinine, Ser: 1.35 mg/dL — ABNORMAL HIGH (ref 0.61–1.24)
GFR, Estimated: 49 mL/min — ABNORMAL LOW (ref >60)
GFR, Estimated: >60 mL/min (ref >60)
Glucose, Bld: 211 mg/dL — ABNORMAL HIGH (ref 70–99)
Glucose, Bld: 220 mg/dL — ABNORMAL HIGH (ref 70–99)
Potassium: 3 mmol/L — ABNORMAL LOW (ref 3.5–5.1)
Potassium: 3.3 mmol/L — ABNORMAL LOW (ref 3.5–5.1)
Sodium: 160 mmol/L — ABNORMAL HIGH (ref 135–145)
Sodium: 159 mmol/L — ABNORMAL HIGH (ref 135–145)

## 2024-07-23 LAB — CBC
HCT: 37 % — ABNORMAL LOW (ref 39.0–52.0)
Hemoglobin: 12.8 g/dL — ABNORMAL LOW (ref 13.0–17.0)
MCH: 31.9 pg (ref 26.0–34.0)
MCHC: 34.6 g/dL (ref 30.0–36.0)
MCV: 92.3 fL (ref 80.0–100.0)
Platelets: 83 K/uL — ABNORMAL LOW (ref 150–400)
RBC: 4.01 MIL/uL — ABNORMAL LOW (ref 4.22–5.81)
RDW: 14.8 % (ref 11.5–15.5)
WBC: 8.3 K/uL (ref 4.0–10.5)
nRBC: 0 % (ref 0.0–0.2)

## 2024-07-23 LAB — GLUCOSE, CAPILLARY
Glucose-Capillary: 221 mg/dL — ABNORMAL HIGH (ref 70–99)
Glucose-Capillary: 174 mg/dL — ABNORMAL HIGH (ref 70–99)
Glucose-Capillary: 247 mg/dL — ABNORMAL HIGH (ref 70–99)
Glucose-Capillary: 209 mg/dL — ABNORMAL HIGH (ref 70–99)
Glucose-Capillary: 216 mg/dL — ABNORMAL HIGH (ref 70–99)
Glucose-Capillary: 167 mg/dL — ABNORMAL HIGH (ref 70–99)
Glucose-Capillary: 243 mg/dL — ABNORMAL HIGH (ref 70–99)

## 2024-07-23 LAB — CULTURE, BLOOD (ROUTINE X 2)

## 2024-07-23 LAB — MAGNESIUM: Magnesium: 2.7 mg/dL — ABNORMAL HIGH (ref 1.7–2.4)

## 2024-07-23 LAB — PHOSPHORUS: Phosphorus: 1.5 mg/dL — ABNORMAL LOW (ref 2.5–4.6)

## 2024-07-23 MED ORDER — VITAL HP 1.0 CAL PO LIQD
1000.0000 mL | ORAL | Status: DC
  Administered 2024-07-23 – 2024-07-24 (×2): 1000 mL

## 2024-07-23 MED ORDER — DEXTROSE 5 % IV SOLN: INTRAVENOUS | Status: AC

## 2024-07-23 MED ORDER — ALTEPLASE 2 MG IJ SOLR
2.0000 mg | Freq: Once | INTRAMUSCULAR | Status: AC
  Administered 2024-07-23: 2 mg
  Filled 2024-07-23: qty 2

## 2024-07-23 MED ORDER — POTASSIUM PHOSPHATES 15 MMOLE/5ML IV SOLN
30.0000 mmol | Freq: Once | INTRAVENOUS | Status: AC
  Administered 2024-07-23: 30 mmol via INTRAVENOUS
  Filled 2024-07-23: qty 10

## 2024-07-23 MED ORDER — POTASSIUM CHLORIDE 20 MEQ PO PACK
40.0000 meq | PACK | Freq: Two times a day (BID) | ORAL | Status: DC
  Administered 2024-07-23: 40 meq
  Filled 2024-07-23: qty 2

## 2024-07-23 MED ORDER — POTASSIUM CHLORIDE 10 MEQ/100ML IV SOLN: 10.0000 meq | INTRAVENOUS | Status: DC

## 2024-07-23 MED ORDER — POTASSIUM CHLORIDE 20 MEQ PO PACK
40.0000 meq | PACK | Freq: Two times a day (BID) | ORAL | Status: AC
  Administered 2024-07-23 – 2024-07-24 (×2): 40 meq
  Filled 2024-07-23 (×2): qty 2

## 2024-07-23 MED ORDER — FREE WATER
300.0000 mL | Status: DC
  Administered 2024-07-23 – 2024-07-24 (×12): 300 mL

## 2024-07-23 NOTE — Progress Notes (Signed)
 Patient got significantly agitated once sedation wore off Was able to squeeze my hand on the left side Attempted to look in my direction when I told him to look towards his left Did not move his right hand as much -He is moving all extremities  We had to put him back on sedation as he is not redirectable  I-STAT shows sodium at 159 down from 160  Mental status change may be related to his hypernatremia  Will obtain CT head without contrast

## 2024-07-23 NOTE — Progress Notes (Signed)
 CT head with no acute changes compared to 48 hours ago  Continue to monitor mental status  Correct electrolytes Wean down sedation as tolerated

## 2024-07-23 NOTE — Progress Notes (Signed)
Pt transported to CT and back without complications. 

## 2024-07-23 NOTE — Progress Notes (Addendum)
 NAME:  Jorge Calhoun, MRN:  [id number], DOB:  1966/06/25, LOS: 3 ADMISSION DATE:  07/20/2024, CONSULTATION DATE:  07/20/24 REFERRING MD:  EDP, CHIEF COMPLAINT:  AMS   History of Present Illness:  58 year old man w/ type 1 DM presenting with 3-4 days of feeling generally unwell, poor PO, N/V.  Went to [place] yesterday and given something for an infection.  Today found unresponsive at home, workup in ER neg head imaging, labs with profound hyperglycemia, DKA.  PCCM to admit given degree of AMS and hyperglycemia.  Has come around a little with IVF.  Having some back pain but this is chronic.  Was given morphine  before I saw him so he is pretty out again.  Spouse at bedside to corroborate hx.  + sinus problems but chronic, no clear fevers/chills.  Full 14 point ROS + for confusion, fatigue, poor PO, N/V.  Care everywhere reviewed: [facility]:  Assessment: 1. Acute bronchitis  Plan: 1. Rx Zofran  ODT, Augmentin 875, Mucinex ER, chlorpheniramine and Tessalon  Perles. Work excuse given to return to work on 12   Pertinent  Medical History  HTN DM  Significant Hospital Events: Including procedures, antibiotic start and stop dates in addition to other pertinent events   12/4 admit in DKA, RVP panel positive for rhinovirus/enterovirus 12/5 progressive decline in mentation with eventual development of unresponsiveness intubated for airway protection with central line and A-line placed as well  Interim History / Subjective:   No significant overnight events Will attempt to wean sedation this morning Electrolyte derangement   Objective    Blood pressure 113/67, pulse 99, temperature 99 F (37.2 C), temperature source Axillary, resp. rate 15, height 5' 11 (1.803 m), weight 104.2 kg, SpO2 100%.    Vent Mode: PRVC FiO2 (%):  [30 %] 30 % Set Rate:  [15 bmp] 15 bmp Vt Set:  [520 mL] 520 mL PEEP:  [5 cmH20] 5 cmH20 Plateau Pressure:  [13 cmH20-15 cmH20] 14 cmH20   Intake/Output  Summary (Last 24 hours) at 07/23/2024 0843 Last data filed at 07/23/2024 9167 Gross per 24 hour  Intake 4704.69 ml  Output 2350 ml  Net 2354.69 ml   Filed Weights   07/20/24 2017 07/22/24 0500 07/23/24 0500  Weight: 108.9 kg 104.2 kg 104.2 kg    Examination: General: Acutely ill-appearing HEENT: Moist oral mucosa, endotracheal tube in place Neuro: Sedated this morning CV: S1-S2 appreciated, no murmur PULM: Clear breath sounds auscultation GI: Soft, bowel sounds appreciated Extremities: Skin is warm and dry Skin: No rashes or lesions  Lab data reviewed  Hypernatremia worse - Has been on free water  - Renal parameters improving  Resolved problem list   Assessment and Plan   Septic shock Concern for pneumonia Rhinovirus infection probably leading to his DKA - Transition from Unasyn  to ceftriaxone  - 1 out of 4 bottles showed strep viridans -Off pressors  Altered mental status Metabolic encephalopathy Inability to protect airway - Continue mechanical ventilation  -Target TVol 6-8cc/kgIBW -Target Plateau Pressure < 30cm H20 -Target driving pressure less than 15 cm of water  -Ventilator associated pneumonia prevention protocol  DKA Type 1 diabetes -Is out of DKA -On insulin  -Continue SSI  Hypernatremia -Increase free water  -Will give D5 500 cc bolus -Monitor sodium  Acute kidney injury - Avoid nephrotoxic medications - Maintain renal perfusion  Hypertension - Will continue to monitor - Home medications on hold  Sinusitis - On antibiotics  Will be trying to wean today to see whether he  is ready for extubation - Still has significant electrolyte derangements being addressed  Updated spouse at bedside  The patient is critically ill with multiple organ systems failure and requires high complexity decision making for assessment and support, frequent evaluation and titration of therapies, application of advanced monitoring technologies and extensive  interpretation of multiple databases. Critical Care Time devoted to patient care services described in this note independent of APP/resident time (if applicable)  is 33 minutes.   Jennet Epley MD [facility] Personal pager: See Amion If unanswered, please page CCM On-call: #[phone]

## 2024-07-24 DIAGNOSIS — G9341 Metabolic encephalopathy: Secondary | ICD-10-CM | POA: Diagnosis not present

## 2024-07-24 DIAGNOSIS — E109 Type 1 diabetes mellitus without complications: Secondary | ICD-10-CM

## 2024-07-24 DIAGNOSIS — E870 Hyperosmolality and hypernatremia: Secondary | ICD-10-CM | POA: Diagnosis not present

## 2024-07-24 DIAGNOSIS — A419 Sepsis, unspecified organism: Secondary | ICD-10-CM | POA: Diagnosis not present

## 2024-07-24 DIAGNOSIS — R6521 Severe sepsis with septic shock: Secondary | ICD-10-CM | POA: Diagnosis not present

## 2024-07-24 DIAGNOSIS — J9601 Acute respiratory failure with hypoxia: Secondary | ICD-10-CM | POA: Diagnosis not present

## 2024-07-24 DIAGNOSIS — N179 Acute kidney failure, unspecified: Secondary | ICD-10-CM | POA: Diagnosis not present

## 2024-07-24 LAB — BASIC METABOLIC PANEL WITH GFR
Anion gap: 8 (ref 5–15)
Anion gap: 6 (ref 5–15)
Anion gap: 6 (ref 5–15)
Anion gap: 6 (ref 5–15)
BUN: 17 mg/dL (ref 6–20)
BUN: 20 mg/dL (ref 6–20)
BUN: 19 mg/dL (ref 6–20)
BUN: 18 mg/dL (ref 6–20)
CO2: 26 mmol/L (ref 22–32)
CO2: 32 mmol/L (ref 22–32)
CO2: 32 mmol/L (ref 22–32)
CO2: 32 mmol/L (ref 22–32)
Calcium: 6.9 mg/dL — ABNORMAL LOW (ref 8.9–10.3)
Calcium: 8.3 mg/dL — ABNORMAL LOW (ref 8.9–10.3)
Calcium: 8.5 mg/dL — ABNORMAL LOW (ref 8.9–10.3)
Calcium: 8.1 mg/dL — ABNORMAL LOW (ref 8.9–10.3)
Chloride: 105 mmol/L (ref 98–111)
Chloride: 116 mmol/L — ABNORMAL HIGH (ref 98–111)
Chloride: 116 mmol/L — ABNORMAL HIGH (ref 98–111)
Chloride: 114 mmol/L — ABNORMAL HIGH (ref 98–111)
Creatinine, Ser: 1.07 mg/dL (ref 0.61–1.24)
Creatinine, Ser: 1.14 mg/dL (ref 0.61–1.24)
Creatinine, Ser: 0.99 mg/dL (ref 0.61–1.24)
Creatinine, Ser: 0.93 mg/dL (ref 0.61–1.24)
GFR, Estimated: >60 mL/min (ref >60)
GFR, Estimated: >60 mL/min (ref >60)
GFR, Estimated: >60 mL/min (ref >60)
GFR, Estimated: >60 mL/min (ref >60)
Glucose, Bld: 191 mg/dL — ABNORMAL HIGH (ref 70–99)
Glucose, Bld: 223 mg/dL — ABNORMAL HIGH (ref 70–99)
Glucose, Bld: 233 mg/dL — ABNORMAL HIGH (ref 70–99)
Glucose, Bld: 263 mg/dL — ABNORMAL HIGH (ref 70–99)
Potassium: 3.4 mmol/L — ABNORMAL LOW (ref 3.5–5.1)
Potassium: 4.2 mmol/L (ref 3.5–5.1)
Potassium: 3.6 mmol/L (ref 3.5–5.1)
Potassium: 3.3 mmol/L — ABNORMAL LOW (ref 3.5–5.1)
Sodium: 139 mmol/L (ref 135–145)
Sodium: 154 mmol/L — ABNORMAL HIGH (ref 135–145)
Sodium: 155 mmol/L — ABNORMAL HIGH (ref 135–145)
Sodium: 151 mmol/L — ABNORMAL HIGH (ref 135–145)

## 2024-07-24 LAB — CULTURE, RESPIRATORY W GRAM STAIN

## 2024-07-24 LAB — GLUCOSE, CAPILLARY
Glucose-Capillary: 207 mg/dL — ABNORMAL HIGH (ref 70–99)
Glucose-Capillary: 214 mg/dL — ABNORMAL HIGH (ref 70–99)
Glucose-Capillary: 214 mg/dL — ABNORMAL HIGH (ref 70–99)
Glucose-Capillary: 215 mg/dL — ABNORMAL HIGH (ref 70–99)
Glucose-Capillary: 236 mg/dL — ABNORMAL HIGH (ref 70–99)
Glucose-Capillary: 209 mg/dL — ABNORMAL HIGH (ref 70–99)

## 2024-07-24 LAB — POCT I-STAT, CHEM 8
BUN: 21 mg/dL — ABNORMAL HIGH (ref 6–20)
Calcium, Ion: 1.14 mmol/L — ABNORMAL LOW (ref 1.15–1.40)
Chloride: 118 mmol/L — ABNORMAL HIGH (ref 98–111)
Creatinine, Ser: 1.1 mg/dL (ref 0.61–1.24)
Glucose, Bld: 238 mg/dL — ABNORMAL HIGH (ref 70–99)
HCT: 31 % — ABNORMAL LOW (ref 39.0–52.0)
Hemoglobin: 10.5 g/dL — ABNORMAL LOW (ref 13.0–17.0)
Potassium: 3 mmol/L — ABNORMAL LOW (ref 3.5–5.1)
Sodium: 157 mmol/L — ABNORMAL HIGH (ref 135–145)
TCO2: 24 mmol/L (ref 22–32)

## 2024-07-24 LAB — PHOSPHORUS: Phosphorus: 2.5 mg/dL (ref 2.5–4.6)

## 2024-07-24 LAB — CBC
HCT: 33.7 % — ABNORMAL LOW (ref 39.0–52.0)
Hemoglobin: 11.9 g/dL — ABNORMAL LOW (ref 13.0–17.0)
MCH: 34 pg (ref 26.0–34.0)
MCHC: 35.3 g/dL (ref 30.0–36.0)
MCV: 96.3 fL (ref 80.0–100.0)
Platelets: 88 K/uL — ABNORMAL LOW (ref 150–400)
RBC: 3.5 MIL/uL — ABNORMAL LOW (ref 4.22–5.81)
RDW: 14.5 % (ref 11.5–15.5)
WBC: 6 K/uL (ref 4.0–10.5)
nRBC: 0 % (ref 0.0–0.2)

## 2024-07-24 LAB — MRSA NEXT GEN BY PCR, NASAL: MRSA by PCR Next Gen: NOT DETECTED

## 2024-07-24 LAB — MAGNESIUM: Magnesium: 2.3 mg/dL (ref 1.7–2.4)

## 2024-07-24 MED ORDER — POTASSIUM CHLORIDE 20 MEQ PO PACK
60.0000 meq | PACK | Freq: Once | ORAL | Status: AC
  Administered 2024-07-24: 60 meq
  Filled 2024-07-24: qty 3

## 2024-07-24 MED ORDER — NOREPINEPHRINE 4 MG/250ML-% IV SOLN: 0.0000 ug/min | INTRAVENOUS | Status: DC

## 2024-07-24 MED ORDER — FENTANYL CITRATE (PF) 50 MCG/ML IJ SOSY
50.0000 ug | PREFILLED_SYRINGE | INTRAMUSCULAR | Status: DC | PRN
  Administered 2024-07-25 (×2): 50 ug via INTRAVENOUS
  Filled 2024-07-24 (×2): qty 1

## 2024-07-24 MED ORDER — DEXMEDETOMIDINE HCL IN NACL 200 MCG/50ML IV SOLN
0.0000 ug/kg/h | INTRAVENOUS | Status: DC
  Administered 2024-07-24: 1 ug/kg/h via INTRAVENOUS
  Administered 2024-07-24: 0.4 ug/kg/h via INTRAVENOUS
  Administered 2024-07-24: 1 ug/kg/h via INTRAVENOUS
  Administered 2024-07-24: 0.6 ug/kg/h via INTRAVENOUS
  Administered 2024-07-24 (×2): 1 ug/kg/h via INTRAVENOUS
  Administered 2024-07-25: 0.9 ug/kg/h via INTRAVENOUS
  Administered 2024-07-25: 1 ug/kg/h via INTRAVENOUS
  Administered 2024-07-25: 1.1 ug/kg/h via INTRAVENOUS
  Administered 2024-07-25: 0.7 ug/kg/h via INTRAVENOUS
  Filled 2024-07-24 (×11): qty 50

## 2024-07-24 MED ORDER — FREE WATER
300.0000 mL | Status: DC
  Administered 2024-07-24 (×2): 300 mL

## 2024-07-24 MED ORDER — FAMOTIDINE 20 MG PO TABS
20.0000 mg | ORAL_TABLET | Freq: Two times a day (BID) | ORAL | Status: DC
  Administered 2024-07-24 – 2024-07-25 (×3): 20 mg
  Filled 2024-07-24 (×3): qty 1

## 2024-07-24 MED ORDER — ACETAMINOPHEN 160 MG/5ML PO SOLN
650.0000 mg | Freq: Four times a day (QID) | ORAL | Status: DC | PRN
  Administered 2024-07-24: 650 mg
  Filled 2024-07-24: qty 20.3

## 2024-07-24 MED ORDER — DEXTROSE 5 % IV SOLN: INTRAVENOUS | Status: AC

## 2024-07-24 MED ORDER — POTASSIUM CHLORIDE 10 MEQ/100ML IV SOLN
10.0000 meq | INTRAVENOUS | Status: AC
  Administered 2024-07-25 (×2): 10 meq via INTRAVENOUS
  Filled 2024-07-24 (×2): qty 100

## 2024-07-24 MED ORDER — FREE WATER
200.0000 mL | Status: DC
  Administered 2024-07-24 – 2024-07-25 (×9): 200 mL

## 2024-07-24 MED ORDER — FUROSEMIDE 10 MG/ML IJ SOLN
20.0000 mg | Freq: Once | INTRAMUSCULAR | Status: AC
  Administered 2024-07-24: 20 mg via INTRAVENOUS
  Filled 2024-07-24: qty 2

## 2024-07-24 MED ORDER — FENTANYL CITRATE (PF) 50 MCG/ML IJ SOSY: 50.0000 ug | PREFILLED_SYRINGE | INTRAMUSCULAR | Status: DC | PRN

## 2024-07-24 NOTE — Progress Notes (Addendum)
 eLink Physician-Brief Progress Note Patient Name: Jorge Calhoun DOB: September 23, 1965 MRN: [id number]   Date of Service  07/24/2024  HPI/Events of Note  Notified of temp  100.9 Currently being treated for pneumonia with ceftriaxone .   eICU Interventions  Placed order for tylenol  PRN        Selma Rodelo M DELA CRUZ 07/24/2024, 8:42 PM  11:36 PM Notified of K 3.3.  Creatinine within normal limits.  Placed order for 20meq KCl IV, 60meq KCl PO

## 2024-07-24 NOTE — Progress Notes (Signed)
 NAME:  Jorge Calhoun, MRN:  [id number], DOB:  Feb 15, 1966, LOS: 4 ADMISSION DATE:  07/20/2024, CONSULTATION DATE:  07/20/2024 REFERRING MD:  EDP, CHIEF COMPLAINT:  AMS   History of Present Illness:  58 year old man who presented to [facility] 12/4 after being found down unresponsive at home. PMHx significant for HTN, PSVT, T1DM, hyperthyroidism, chronic back pain.  Patient presented to the [facility] 12/3 after 3-4 days of feeling generally unwell, poor PO intake, n/v/d. Per spouse, was given something for an infection.  On day of admission (12/4), found unresponsive at home. Brought to ED via EMS; on EMS arrival patient was hypotensive with SBP 80s, SpO2 100% on 2LNC, CBG > 400. Workup in ER included negative head imaging, labs demonstrating profound hyperglycemia, DKA. Fluid resuscitation was completed, bicarb administered and patient was started on insulin  gtt.  PCCM consulted for admission, given degree of AMS and hyperglycemia.   +Rhinovirus/enterovirus. Intubated for airway protection in the setting of AMS.  Pertinent Medical History:   Past Medical History:  Diagnosis Date   Hypertension    Nontoxic multinodular goiter    Obesity, unspecified    Plantar fasciitis    PSVT (paroxysmal supraventricular tachycardia)    noted on heart monitor 02/2020   Type I (juvenile type) diabetes mellitus without mention of complication, not stated as uncontrolled    Significant Hospital Events: Including procedures, antibiotic start and stop dates in addition to other pertinent events   12/4 Admit in DKA, RVP panel positive for rhinovirus/enterovirus 12/5 Progressive decline in mentation with eventual development of unresponsiveness intubated for airway protection with central line and A-line placed as well  Interim History/Subjective:  No significant events Initial Na 139 this AM (previously 159), lab error as redraw much more in line with prior value Weaned on vent for a few hours today Precedex   started, weaned off of Prop Off of vasopressors  Objective:   Blood pressure 120/69, pulse 96, temperature 99.5 F (37.5 C), temperature source Oral, resp. rate 17, height 5' 11 (1.803 m), weight 106.8 kg, SpO2 96%.    Vent Mode: PRVC FiO2 (%):  [30 %] 30 % Set Rate:  [15 bmp] 15 bmp Vt Set:  [520 mL] 520 mL PEEP:  [5 cmH20] 5 cmH20 Plateau Pressure:  [13 cmH20-15 cmH20] 14 cmH20   Intake/Output Summary (Last 24 hours) at 07/24/2024 0725 Last data filed at 07/24/2024 9287 Gross per 24 hour  Intake 7308.69 ml  Output 3850 ml  Net 3458.69 ml   Filed Weights   07/22/24 0500 07/23/24 0500 07/24/24 0453  Weight: 104.2 kg 104.2 kg 106.8 kg   Physical Examination: General: Acutely ill-appearing middle-aged man in NAD. HEENT: [place]/AT, anicteric sclera, moist mucous membranes. Neuro: Intubated, sedated. Responds to noxious stimuli. Moves all extremities spontaneously. +Corneal, +Cough, and +Gag  CV: RRR, no m/g/r. PULM: Breathing even and unlabored on vent (PEEP 5, FiO2 30%). Lung fields CTAB. GI: Soft, nontender, nondistended. Normoactive bowel sounds. Extremities: No significant LE edema noted. Skin: Warm/dry, no rashes.  Resolved Problem List:    Assessment and Plan:   Septic shock Concern for pneumonia Rhinovirus infection Sinusitis 1 out of 4 bottles +strep viridans. - Goal MAP > 65 - Fluid resuscitation as tolerated - Levophed  titrated to goal MAP, fortunately off of vasopressors at this time - Trend WBC, fever curve - F/u Cx data - Continue broad-spectrum antibiotics (Unasyn  deescalated to ceftriaxone )  Respiratory insufficiency 2/2 encephalopathy r/t DKA - Continue full vent support (4-8cc/kg IBW) - Wean FiO2  for O2 sat > 90% - Daily WUA/SBT, extubation currently precluded by mental status - VAP bundle - Pulmonary hygiene - PAD protocol for sedation: Precedex  and Fentanyl  for goal RASS 0 to -1, prop discontinued  Metabolic encephalopathy CT Head NAICA  12/7. - Trend BMP - Replete electrolytes as indicated - Monitor I&Os - F/u urine studies - Avoid nephrotoxic agents as able - Ensure adequate renal perfusion  DKA, resolved Type 1 diabetes - Transitioned off of insulin  gtt - Basal Lantus  10U BID - SSI - CBGs Q4H - Goal CBG 140-180  Acute kidney injury in the setting of hypotension, prerenal 2/2 hypovolemia Hypernatremia - Trend BMP Q6H, specifically Na - Continue FWF 300mL Q4H + D5 at 8mL/hr - Replete electrolytes as indicated - Monitor I&Os - Avoid nephrotoxic agents as able - Ensure adequate renal perfusion  Hypertension - Holding home antihypertensives at present  Critical care time:   The patient is critically ill with multiple organ system failure and requires high complexity decision making for assessment and support, frequent evaluation and titration of therapies, advanced monitoring, review of radiographic studies and interpretation of complex data.   Critical Care Time devoted to patient care services, exclusive of separately billable procedures, described in this note is 36 minutes.  Corean CHRISTELLA Salwa Bai, PA-C [facility] 07/24/24 7:26 AM  Please see [email or web address] for pager details.  From 7A-7P if no response, please call [phone] After hours, please call ELink [phone]

## 2024-07-24 NOTE — Progress Notes (Signed)
 Initial Nutrition Assessment  DOCUMENTATION CODES:   Obesity unspecified  INTERVENTION:  - Patient currently receiving trickle TF: Vital HP @ 29mL/hr  - If patient unable to be extubated, would recommend below goal TF regimen: Vital 1.5 at 55 ml/h (1320 ml per day) *Would recommend starting at 21mL/hr and advancing by 10mL Q8H Prosource TF20 60 ml BID Provides 2140 kcal, 129 gm protein, 1008 ml free water  daily  NUTRITION DIAGNOSIS:   Inadequate oral intake related to inability to eat as evidenced by NPO status.  GOAL:   Patient will meet greater than or equal to 90% of their needs  MONITOR:   Vent status, Labs, Weight trends, TF tolerance  REASON FOR ASSESSMENT:   Consult Enteral/tube feeding initiation and management (Trickle TF)  ASSESSMENT:   58 year old male with PMH type 1 DM who presented with 3-4 days of feeling generally unwell, poor PO, N/V. Admit for rhinovirus and DKA.  12/4 Admit 12/5 Intubated  Patient is currently intubated on ventilator support MV: 8.5 L/min Temp (24hrs), Avg:99.2 F (37.3 C), Min:97.5 F (36.4 C), Max:99.9 F (37.7 C)  Wife and daughter at bedside at time of visit.  Wife reports a UBW around 215# and that the patient has been trying to lose weight over the past year. Per chart review, patient weighed at 236# in July and weighed at 229# this admission.   Wife reports patient was eating well and normally up until admission. Typically eats 3 meals a day and has a [employer] protein drink 3-4 times a week.   OGT placed and x-ray verified in the stomach. Trickle TF's started yesterday afternoon. Per RN, patient tolerating well. RN reports CCM planning to try and extubate today. Will provide goal TF recs in event patient unable to be extubated.    Medications reviewed and include: Colace, Miralax  Precedex  Fentanyl  Propofol  @ 9.76mL/hr (provides 259 kcals over 24 hours)  Labs reviewed:  K+ 3.4 HA1C 8.8 (as of 12/5) Blood Glucose  167-247 x24 hours  NUTRITION - FOCUSED PHYSICAL EXAM:  Flowsheet Row Most Recent Value  Orbital Region No depletion  Upper Arm Region No depletion  Thoracic and Lumbar Region No depletion  Buccal Region No depletion  Temple Region No depletion  Clavicle Bone Region No depletion  Clavicle and Acromion Bone Region No depletion  Scapular Bone Region Unable to assess  Dorsal Hand No depletion  Patellar Region No depletion  Anterior Thigh Region No depletion  Posterior Calf Region No depletion  Edema (RD Assessment) Mild  Hair Reviewed  Eyes Unable to assess  Mouth Unable to assess  Skin Reviewed  Nails Reviewed    Diet Order:   Diet Order             Diet NPO time specified  Diet effective now                   EDUCATION NEEDS:  No education needs have been identified at this time  Skin:  Skin Assessment: Reviewed RN Assessment  Last BM:  12/7  Height:  Ht Readings from Last 1 Encounters:  07/21/24 5' 11 (1.803 m)   Weight:  Wt Readings from Last 1 Encounters:  07/24/24 106.8 kg   Ideal Body Weight:  78.18 kg  BMI:  Body mass index is 32.84 kg/m.  Estimated Nutritional Needs:  Kcal:  1950-2200 kcals Protein:  115-140 grams Fluid:  >/= 2L   Trude Ned RD, LDN Contact via Secure Chat.

## 2024-07-24 NOTE — Plan of Care (Signed)
  Problem: Education: Goal: Knowledge of General Education information will improve Description: Including pain rating scale, medication(s)/side effects and non-pharmacologic comfort measures Outcome: Progressing   Problem: Health Behavior/Discharge Planning: Goal: Ability to manage health-related needs will improve Outcome: Progressing   Problem: Elimination: Goal: Will not experience complications related to bowel motility Outcome: Progressing   Problem: Safety: Goal: Ability to remain free from injury will improve Outcome: Progressing   

## 2024-07-24 NOTE — Progress Notes (Signed)
   07/24/24 0858  TOC Brief Assessment  Insurance and Status Reviewed  Patient has primary care physician Yes  Home environment has been reviewed Single family home with spouse  Prior level of function: Independent with ADL's  Prior/Current Home Services No current home services  Social Drivers of Health Review SDOH reviewed no interventions necessary  Readmission risk has been reviewed Yes  Transition of care needs transition of care needs identified, TOC will continue to follow   Pt screened. Pt is currently on the ventilator. Pt needing continued medical workup, not medically stable for discharge. IP CM will continue to follow for any new recommendations or DC planning needs.

## 2024-07-25 DIAGNOSIS — J9601 Acute respiratory failure with hypoxia: Secondary | ICD-10-CM | POA: Diagnosis not present

## 2024-07-25 DIAGNOSIS — A419 Sepsis, unspecified organism: Secondary | ICD-10-CM | POA: Diagnosis not present

## 2024-07-25 DIAGNOSIS — G9341 Metabolic encephalopathy: Secondary | ICD-10-CM | POA: Diagnosis not present

## 2024-07-25 DIAGNOSIS — R6521 Severe sepsis with septic shock: Secondary | ICD-10-CM | POA: Diagnosis not present

## 2024-07-25 LAB — BASIC METABOLIC PANEL WITH GFR
Anion gap: 7 (ref 5–15)
Anion gap: 15 (ref 5–15)
Anion gap: 18 — ABNORMAL HIGH (ref 5–15)
BUN: 19 mg/dL (ref 6–20)
BUN: 21 mg/dL — ABNORMAL HIGH (ref 6–20)
BUN: 20 mg/dL (ref 6–20)
CO2: 32 mmol/L (ref 22–32)
CO2: 25 mmol/L (ref 22–32)
CO2: 25 mmol/L (ref 22–32)
Calcium: 8.4 mg/dL — ABNORMAL LOW (ref 8.9–10.3)
Calcium: 8.5 mg/dL — ABNORMAL LOW (ref 8.9–10.3)
Calcium: 8.5 mg/dL — ABNORMAL LOW (ref 8.9–10.3)
Chloride: 113 mmol/L — ABNORMAL HIGH (ref 98–111)
Chloride: 108 mmol/L (ref 98–111)
Chloride: 107 mmol/L (ref 98–111)
Creatinine, Ser: 0.96 mg/dL (ref 0.61–1.24)
Creatinine, Ser: 0.83 mg/dL (ref 0.61–1.24)
Creatinine, Ser: 0.89 mg/dL (ref 0.61–1.24)
GFR, Estimated: >60 mL/min (ref >60)
GFR, Estimated: >60 mL/min (ref >60)
GFR, Estimated: >60 mL/min (ref >60)
Glucose, Bld: 220 mg/dL — ABNORMAL HIGH (ref 70–99)
Glucose, Bld: 154 mg/dL — ABNORMAL HIGH (ref 70–99)
Glucose, Bld: 158 mg/dL — ABNORMAL HIGH (ref 70–99)
Potassium: 3.5 mmol/L (ref 3.5–5.1)
Potassium: 3.6 mmol/L (ref 3.5–5.1)
Potassium: 3.6 mmol/L (ref 3.5–5.1)
Sodium: 152 mmol/L — ABNORMAL HIGH (ref 135–145)
Sodium: 149 mmol/L — ABNORMAL HIGH (ref 135–145)
Sodium: 150 mmol/L — ABNORMAL HIGH (ref 135–145)

## 2024-07-25 LAB — CBC
HCT: 37 % — ABNORMAL LOW (ref 39.0–52.0)
Hemoglobin: 12.3 g/dL — ABNORMAL LOW (ref 13.0–17.0)
MCH: 31.5 pg (ref 26.0–34.0)
MCHC: 33.2 g/dL (ref 30.0–36.0)
MCV: 94.6 fL (ref 80.0–100.0)
Platelets: 75 K/uL — ABNORMAL LOW (ref 150–400)
RBC: 3.91 MIL/uL — ABNORMAL LOW (ref 4.22–5.81)
RDW: 14.1 % (ref 11.5–15.5)
WBC: 6 K/uL (ref 4.0–10.5)
nRBC: 0 % (ref 0.0–0.2)

## 2024-07-25 LAB — GLUCOSE, CAPILLARY
Glucose-Capillary: 145 mg/dL — ABNORMAL HIGH (ref 70–99)
Glucose-Capillary: 166 mg/dL — ABNORMAL HIGH (ref 70–99)
Glucose-Capillary: 187 mg/dL — ABNORMAL HIGH (ref 70–99)
Glucose-Capillary: 194 mg/dL — ABNORMAL HIGH (ref 70–99)
Glucose-Capillary: 219 mg/dL — ABNORMAL HIGH (ref 70–99)
Glucose-Capillary: 228 mg/dL — ABNORMAL HIGH (ref 70–99)

## 2024-07-25 LAB — PHOSPHORUS: Phosphorus: 2.1 mg/dL — ABNORMAL LOW (ref 2.5–4.6)

## 2024-07-25 LAB — MAGNESIUM: Magnesium: 2.7 mg/dL — ABNORMAL HIGH (ref 1.7–2.4)

## 2024-07-25 LAB — CALCIUM, IONIZED: Calcium, Ionized, Serum: 4.7 mg/dL (ref 4.5–5.6)

## 2024-07-25 MED ORDER — DOCUSATE SODIUM 50 MG/5ML PO LIQD
100.0000 mg | Freq: Two times a day (BID) | ORAL | Status: DC
  Administered 2024-08-06: 100 mg via ORAL
  Filled 2024-07-25 (×33): qty 10

## 2024-07-25 MED ORDER — ASPIRIN 81 MG PO CHEW
81.0000 mg | CHEWABLE_TABLET | Freq: Every day | ORAL | Status: DC
  Administered 2024-07-25 – 2024-07-26 (×2): 81 mg via ORAL
  Filled 2024-07-25 (×2): qty 1

## 2024-07-25 MED ORDER — KETOROLAC TROMETHAMINE 15 MG/ML IJ SOLN
15.0000 mg | Freq: Once | INTRAMUSCULAR | Status: AC
  Administered 2024-07-25: 15 mg via INTRAVENOUS
  Filled 2024-07-25: qty 1

## 2024-07-25 MED ORDER — POLYETHYLENE GLYCOL 3350 17 G PO PACK
17.0000 g | PACK | Freq: Every day | ORAL | Status: DC
  Filled 2024-07-25 (×10): qty 1

## 2024-07-25 MED ORDER — FAMOTIDINE 20 MG PO TABS
20.0000 mg | ORAL_TABLET | Freq: Two times a day (BID) | ORAL | Status: DC
  Administered 2024-07-25 – 2024-07-26 (×2): 20 mg via ORAL
  Filled 2024-07-25 (×2): qty 1

## 2024-07-25 MED ORDER — EMPAGLIFLOZIN 25 MG PO TABS
25.0000 mg | ORAL_TABLET | Freq: Every day | ORAL | Status: DC
  Administered 2024-07-25 – 2024-07-26 (×2): 25 mg via ORAL
  Filled 2024-07-25 (×2): qty 1

## 2024-07-25 MED ORDER — METOPROLOL SUCCINATE ER 25 MG PO TB24
50.0000 mg | ORAL_TABLET | Freq: Every day | ORAL | Status: DC
  Administered 2024-07-25 – 2024-07-26 (×2): 50 mg via ORAL
  Filled 2024-07-25 (×2): qty 2

## 2024-07-25 MED ORDER — DEXTROSE 5 % IV SOLN: INTRAVENOUS | Status: DC

## 2024-07-25 MED ORDER — DILTIAZEM HCL ER COATED BEADS 180 MG PO CP24
360.0000 mg | ORAL_CAPSULE | Freq: Every day | ORAL | Status: DC
  Administered 2024-07-25 – 2024-07-26 (×2): 360 mg via ORAL
  Filled 2024-07-25 (×2): qty 2

## 2024-07-25 MED ORDER — HYDRALAZINE HCL 20 MG/ML IJ SOLN
10.0000 mg | INTRAMUSCULAR | Status: DC | PRN
  Administered 2024-07-25 – 2024-07-26 (×5): 10 mg via INTRAVENOUS
  Filled 2024-07-25 (×5): qty 1

## 2024-07-25 MED ORDER — ACETAMINOPHEN 160 MG/5ML PO SOLN
650.0000 mg | Freq: Four times a day (QID) | ORAL | Status: DC | PRN
  Administered 2024-07-25: 650 mg via ORAL
  Filled 2024-07-25: qty 20.3

## 2024-07-25 MED ORDER — BOOST / RESOURCE BREEZE PO LIQD CUSTOM
1.0000 | Freq: Three times a day (TID) | ORAL | Status: DC
  Administered 2024-07-25 – 2024-07-26 (×2): 1 via ORAL

## 2024-07-25 MED ORDER — POTASSIUM CHLORIDE 20 MEQ PO PACK
40.0000 meq | PACK | Freq: Once | ORAL | Status: AC
  Administered 2024-07-25: 40 meq
  Filled 2024-07-25: qty 2

## 2024-07-25 NOTE — Progress Notes (Signed)
 NAME:  IOSEFA WEINTRAUB, MRN:  [id number], DOB:  1966-03-11, LOS: 5 ADMISSION DATE:  07/20/2024, CONSULTATION DATE:  07/20/2024 REFERRING MD:  EDP, CHIEF COMPLAINT:  AMS   History of Present Illness:  58 year old man who presented to [facility] 12/4 after being found down unresponsive at home. PMHx significant for HTN, PSVT, T1DM, hyperthyroidism, chronic back pain.  Patient presented to the [facility] 12/3 after 3-4 days of feeling generally unwell, poor PO intake, n/v/d. Per spouse, was given something for an infection.  On day of admission (12/4), found unresponsive at home. Brought to ED via EMS; on EMS arrival patient was hypotensive with SBP 80s, SpO2 100% on 2LNC, CBG > 400. Workup in ER included negative head imaging, labs demonstrating profound hyperglycemia, DKA. Fluid resuscitation was completed, bicarb administered and patient was started on insulin  gtt.  PCCM consulted for admission, given degree of AMS and hyperglycemia.   +Rhinovirus/enterovirus. Intubated for airway protection in the setting of AMS.  Pertinent Medical History:   Past Medical History:  Diagnosis Date   Hypertension    Nontoxic multinodular goiter    Obesity, unspecified    Plantar fasciitis    PSVT (paroxysmal supraventricular tachycardia)    noted on heart monitor 02/2020   Type I (juvenile type) diabetes mellitus without mention of complication, not stated as uncontrolled    Significant Hospital Events: Including procedures, antibiotic start and stop dates in addition to other pertinent events   12/4 Admit in DKA, RVP panel positive for rhinovirus/enterovirus 12/5 Progressive decline in mentation with eventual development of unresponsiveness intubated for airway protection with central line and A-line placed as well  Interim History/Subjective:  No significant events Doing well this morning, weaning appropriately Extubated Remove A-line as well  Objective:   Blood pressure (!) 180/110, pulse 79,  temperature 99.1 F (37.3 C), temperature source Axillary, resp. rate 19, height 5' 11 (1.803 m), weight 97.8 kg, SpO2 96%.    Vent Mode: PSV;CPAP FiO2 (%):  [30 %] 30 % Set Rate:  [15 bmp] 15 bmp Vt Set:  [520 mL] 520 mL PEEP:  [5 cmH20] 5 cmH20 Pressure Support:  [5 cmH20-10 cmH20] 5 cmH20 Plateau Pressure:  [13 cmH20-17 cmH20] 15 cmH20   Intake/Output Summary (Last 24 hours) at 07/25/2024 0843 Last data filed at 07/25/2024 0746 Gross per 24 hour  Intake 5264.75 ml  Output 2900 ml  Net 2364.75 ml   Filed Weights   07/23/24 0500 07/24/24 0453 07/25/24 0500  Weight: 104.2 kg 106.8 kg 97.8 kg   Physical Examination: General: Acutely ill-appearing middle-aged man in NAD. HEENT: [place]/AT, anicteric sclera, PERRL, moist mucous membranes. Neuro: Awake, oriented x 4, flat affect. Responds to verbal stimuli. Following commands consistently. Moves all 4 extremities spontaneously. CV: Mildly tachycardic to 100s, regular rhythm, no m/g/r. PULM: Breathing even and unlabored on 4LNC. Lung fields CTAB. GI: Soft, nontender, nondistended. Normoactive bowel sounds. Extremities: No LE edema noted. Skin: Warm/dry, no rashes.  Resolved Problem List:  Septic shock, resolved Metabolic encephalopathy, improved  Assessment and Plan:   Concern for pneumonia Rhinovirus infection Sinusitis 1 out of 4 bottles +strep viridans. - Off of vasopressors, shock resolved - Trend WBC, fever curve - F/u Cx data - Continue broad-spectrum antibiotics (ceftriaxone )  Respiratory insufficiency 2/2 encephalopathy r/t DKA - Extubated 12/9AM - Supplemental O2 support to maintain SpO2 > 90% - Pulmonary hygiene - Follow CXR  DKA, resolved Type 1 diabetes - Transitioned off of insulin  gtt - Basal Lantus  10U BID - SSI -  CBGs Q4H - Goal CBG 140-180  Acute kidney injury in the setting of hypotension, prerenal 2/2 hypovolemia Hypernatremia - Trend BMP Q6H, specifically Na - Continue FWF 200mL Q2H + D5 at  100mL/hr - Replete electrolytes as indicated - Monitor I&Os - Avoid nephrotoxic agents as able - Ensure adequate renal perfusion  Hypertension - Resume home antihypertensives as indicated - Cardiac monitoring  Critical care time:   The patient is critically ill with multiple organ system failure and requires high complexity decision making for assessment and support, frequent evaluation and titration of therapies, advanced monitoring, review of radiographic studies and interpretation of complex data.   Critical Care Time devoted to patient care services, exclusive of separately billable procedures, described in this note is 34 minutes.  Corean CHRISTELLA Romari Gasparro, PA-C [facility] 07/25/24 8:43 AM  Please see [email or web address] for pager details.  From 7A-7P if no response, please call [phone] After hours, please call ELink [phone]

## 2024-07-25 NOTE — Plan of Care (Signed)

## 2024-07-25 NOTE — Plan of Care (Signed)
  Problem: Education: Goal: Knowledge of General Education information will improve Description: Including pain rating scale, medication(s)/side effects and non-pharmacologic comfort measures Outcome: Progressing   Problem: Health Behavior/Discharge Planning: Goal: Ability to manage health-related needs will improve Outcome: Progressing   Problem: Clinical Measurements: Goal: Cardiovascular complication will be avoided Outcome: Progressing   Problem: Activity: Goal: Risk for activity intolerance will decrease Outcome: Progressing   Problem: Pain Managment: Goal: General experience of comfort will improve and/or be controlled Outcome: Progressing   Problem: Safety: Goal: Ability to remain free from injury will improve Outcome: Progressing

## 2024-07-25 NOTE — Progress Notes (Signed)
 Nutrition Follow-up  DOCUMENTATION CODES:   Obesity unspecified  INTERVENTION:  - Clear Liquid diet per MD.  - Boost Breeze po TID, each supplement provides 250 kcal and 9 grams of protein - Recommend change to Ensure Max po BID once diet advanced. Each supplement provides 150 kcal and 30 grams of protein.    NUTRITION DIAGNOSIS:   Inadequate oral intake related to inability to eat as evidenced by NPO status. *improving  GOAL:   Patient will meet greater than or equal to 90% of their needs *progressing  MONITOR:   Vent status, Labs, Weight trends, TF tolerance  REASON FOR ASSESSMENT:   Consult Enteral/tube feeding initiation and management (Trickle TF)  ASSESSMENT:   58 year old male with PMH type 1 DM who presented with 3-4 days of feeling generally unwell, poor PO, N/V. Admit for rhinovirus and DKA.  12/4 Admit 12/5 Intubated 12/7 Trickle TF initiated 12/9 Extubated  Patient had recently been extubated at time of visit.  RN reports patient did well with bedside swallow eval. Discussed with CCM MD, can advance diet to clear liquids today. Boost [place] has been added to support oral intake.    Medications reviewed and include: Jardiance , Colace, Miralax , D5 @ 75mL/hr (provides 306 kcals over 24 hours)   Labs reviewed:  Na 152 HA1C 8.8 (as of 12/5) Blood Glucose 187-236 x24 hours    Diet Order:   Diet Order             Diet clear liquid Room service appropriate? Yes; Fluid consistency: Thin  Diet effective now                   EDUCATION NEEDS:  No education needs have been identified at this time  Skin:  Skin Assessment: Reviewed RN Assessment  Last BM:  12/9 - type 7  Height:  Ht Readings from Last 1 Encounters:  07/21/24 5' 11 (1.803 m)   Weight:  Wt Readings from Last 1 Encounters:  07/25/24 97.8 kg   Ideal Body Weight:  78.18 kg  BMI:  Body mass index is 30.07 kg/m.  Estimated Nutritional Needs:  Kcal:  1950-2200  kcals Protein:  105-125 grams Fluid:  >/= 2L    Trude Ned RD, LDN Contact via Secure Chat.

## 2024-07-25 NOTE — Progress Notes (Signed)
 [facility] ADULT ICU REPLACEMENT PROTOCOL   The patient does apply for the [facility] Adult ICU Electrolyte Replacment Protocol based on the criteria listed below:   1.Exclusion criteria: TCTS, ECMO, Dialysis, and Myasthenia Gravis patients 2. Is GFR >/= 30 ml/min? Yes.    Patient's GFR today is >60 3. Is SCr </= 2? Yes.   Patient's SCr is 0.96 mg/dL 4. Did SCr increase >/= 0.5 in 24 hours? No. 5.Pt's weight >40kg  Yes.   6. Abnormal electrolyte(s): K  7. Electrolytes replaced per protocol 8.  Call MD STAT for K+ </= 2.5, Phos </= 1, or Mag </= 1 Physician:  Mallory Sheela Harbour E Amire Gossen 07/25/2024 5:44 AM

## 2024-07-25 NOTE — Inpatient Diabetes Management (Signed)
 Inpatient Diabetes Program Recommendations  AACE/ADA: New Consensus Statement on Inpatient Glycemic Control (2015)  Target Ranges:  Prepandial:   less than 140 mg/dL      Peak postprandial:   less than 180 mg/dL (1-2 hours)      Critically ill patients:  140 - 180 mg/dL   Lab Results  Component Value Date   GLUCAP 187 (H) 07/25/2024   HGBA1C 8.8 (H) 07/21/2024    Review of Glycemic Control  Diabetes history: DM2 Outpatient Diabetes medications: Jardiance  12.5 mg daily, Humalog 5 units daily, Tresiba 24 units daily Current orders for Inpatient glycemic control: Lantus  10 BID, Novolog  0-15 Q4H  Vital 1.5 TF at 20/H Continues on vent  Inpatient Diabetes Program Recommendations:    Add Novolog  2 units Q4H for TF coverage if TFs increase to goal rate.  Continue to follow.  Thank you. Shona Brandy, RD, LDN, CDCES Inpatient Diabetes Coordinator [phone]

## 2024-07-25 NOTE — Progress Notes (Signed)
 PHARMACY - PHYSICIAN COMMUNICATION CRITICAL VALUE ALERT - BLOOD CULTURE IDENTIFICATION (BCID)  Jorge Calhoun is an 58 y.o. male who presented to [facility] on 07/20/2024 with a chief complaint of DKA  Assessment:  1/4 GPR  Name of physician (or Provider) Contacted: Ogan  Current antibiotics: CTX  Changes to prescribed antibiotics recommended:  None, probable contaminant  Results for orders placed or performed during the hospital encounter of 07/20/24  Blood Culture ID Panel (Reflexed) (Collected: 07/20/2024 10:38 PM)  Result Value Ref Range   Enterococcus faecalis NOT DETECTED NOT DETECTED   Enterococcus Faecium NOT DETECTED NOT DETECTED   Listeria monocytogenes NOT DETECTED NOT DETECTED   Staphylococcus species NOT DETECTED NOT DETECTED   Staphylococcus aureus (BCID) NOT DETECTED NOT DETECTED   Staphylococcus epidermidis NOT DETECTED NOT DETECTED   Staphylococcus lugdunensis NOT DETECTED NOT DETECTED   Streptococcus species DETECTED (A) NOT DETECTED   Streptococcus agalactiae NOT DETECTED NOT DETECTED   Streptococcus pneumoniae NOT DETECTED NOT DETECTED   Streptococcus pyogenes NOT DETECTED NOT DETECTED   A.calcoaceticus-baumannii NOT DETECTED NOT DETECTED   Bacteroides fragilis NOT DETECTED NOT DETECTED   Enterobacterales NOT DETECTED NOT DETECTED   Enterobacter cloacae complex NOT DETECTED NOT DETECTED   Escherichia coli NOT DETECTED NOT DETECTED   Klebsiella aerogenes NOT DETECTED NOT DETECTED   Klebsiella oxytoca NOT DETECTED NOT DETECTED   Klebsiella pneumoniae NOT DETECTED NOT DETECTED   Proteus species NOT DETECTED NOT DETECTED   Salmonella species NOT DETECTED NOT DETECTED   Serratia marcescens NOT DETECTED NOT DETECTED   Haemophilus influenzae NOT DETECTED NOT DETECTED   Neisseria meningitidis NOT DETECTED NOT DETECTED   Pseudomonas aeruginosa NOT DETECTED NOT DETECTED   Stenotrophomonas maltophilia NOT DETECTED NOT DETECTED   Candida albicans NOT DETECTED NOT  DETECTED   Candida auris NOT DETECTED NOT DETECTED   Candida glabrata NOT DETECTED NOT DETECTED   Candida krusei NOT DETECTED NOT DETECTED   Candida parapsilosis NOT DETECTED NOT DETECTED   Candida tropicalis NOT DETECTED NOT DETECTED   Cryptococcus neoformans/gattii NOT DETECTED NOT DETECTED    Leeroy Mace RPh 07/25/2024, 11:35 PM

## 2024-07-26 ENCOUNTER — Inpatient Hospital Stay (HOSPITAL_COMMUNITY)

## 2024-07-26 DIAGNOSIS — Z794 Long term (current) use of insulin: Secondary | ICD-10-CM | POA: Diagnosis not present

## 2024-07-26 DIAGNOSIS — I2602 Saddle embolus of pulmonary artery with acute cor pulmonale: Secondary | ICD-10-CM

## 2024-07-26 DIAGNOSIS — E119 Type 2 diabetes mellitus without complications: Secondary | ICD-10-CM

## 2024-07-26 DIAGNOSIS — R6521 Severe sepsis with septic shock: Secondary | ICD-10-CM | POA: Diagnosis not present

## 2024-07-26 DIAGNOSIS — J9601 Acute respiratory failure with hypoxia: Secondary | ICD-10-CM | POA: Diagnosis not present

## 2024-07-26 DIAGNOSIS — A419 Sepsis, unspecified organism: Secondary | ICD-10-CM | POA: Diagnosis not present

## 2024-07-26 DIAGNOSIS — E870 Hyperosmolality and hypernatremia: Secondary | ICD-10-CM | POA: Diagnosis not present

## 2024-07-26 HISTORY — PX: IR THROMBECT PRIM MECH INIT (INCLU) MOD SED: IMG2297

## 2024-07-26 HISTORY — PX: IR ANGIOGRAM SELECTIVE EACH ADDITIONAL VESSEL: IMG667

## 2024-07-26 HISTORY — PX: IR US GUIDE VASC ACCESS RIGHT: IMG2390

## 2024-07-26 HISTORY — PX: IR ANGIOGRAM PULMONARY BILATERAL SELECTIVE: IMG664

## 2024-07-26 LAB — CBC
HCT: 41.9 % (ref 39.0–52.0)
Hemoglobin: 13.6 g/dL (ref 13.0–17.0)
MCH: 30.9 pg (ref 26.0–34.0)
MCHC: 32.5 g/dL (ref 30.0–36.0)
MCV: 95.2 fL (ref 80.0–100.0)
Platelets: 144 K/uL — ABNORMAL LOW (ref 150–400)
RBC: 4.4 MIL/uL (ref 4.22–5.81)
RDW: 14.5 % (ref 11.5–15.5)
WBC: 8.6 K/uL (ref 4.0–10.5)
nRBC: 0 % (ref 0.0–0.2)

## 2024-07-26 LAB — POCT I-STAT 7, (LYTES, BLD GAS, ICA,H+H)
Acid-base deficit: 6 mmol/L — ABNORMAL HIGH (ref 0.0–2.0)
Bicarbonate: 18.3 mmol/L — ABNORMAL LOW (ref 20.0–28.0)
Calcium, Ion: 1.18 mmol/L (ref 1.15–1.40)
HCT: 40 % (ref 39.0–52.0)
Hemoglobin: 13.6 g/dL (ref 13.0–17.0)
O2 Saturation: 100 %
Potassium: 3.1 mmol/L — ABNORMAL LOW (ref 3.5–5.1)
Sodium: 146 mmol/L — ABNORMAL HIGH (ref 135–145)
TCO2: 19 mmol/L — ABNORMAL LOW (ref 22–32)
pCO2 arterial: 32.7 mmHg (ref 32–48)
pH, Arterial: 7.357 (ref 7.35–7.45)
pO2, Arterial: 372 mmHg — ABNORMAL HIGH (ref 83–108)

## 2024-07-26 LAB — BASIC METABOLIC PANEL WITH GFR
Anion gap: 18 — ABNORMAL HIGH (ref 5–15)
BUN: 22 mg/dL — ABNORMAL HIGH (ref 6–20)
CO2: 23 mmol/L (ref 22–32)
Calcium: 8.6 mg/dL — ABNORMAL LOW (ref 8.9–10.3)
Chloride: 108 mmol/L (ref 98–111)
Creatinine, Ser: 0.9 mg/dL (ref 0.61–1.24)
GFR, Estimated: >60 mL/min (ref >60)
Glucose, Bld: 165 mg/dL — ABNORMAL HIGH (ref 70–99)
Potassium: 4 mmol/L (ref 3.5–5.1)
Sodium: 149 mmol/L — ABNORMAL HIGH (ref 135–145)

## 2024-07-26 LAB — GLUCOSE, CAPILLARY
Glucose-Capillary: 140 mg/dL — ABNORMAL HIGH (ref 70–99)
Glucose-Capillary: 161 mg/dL — ABNORMAL HIGH (ref 70–99)
Glucose-Capillary: 118 mg/dL — ABNORMAL HIGH (ref 70–99)
Glucose-Capillary: 128 mg/dL — ABNORMAL HIGH (ref 70–99)
Glucose-Capillary: 175 mg/dL — ABNORMAL HIGH (ref 70–99)

## 2024-07-26 LAB — PROTIME-INR
INR: 1.2 (ref 0.8–1.2)
Prothrombin Time: 16.1 s — ABNORMAL HIGH (ref 11.4–15.2)

## 2024-07-26 LAB — LACTIC ACID, PLASMA: Lactic Acid, Venous: 1.4 mmol/L (ref 0.5–1.9)

## 2024-07-26 LAB — TROPONIN T, HIGH SENSITIVITY
Troponin T High Sensitivity: 147 ng/L (ref 0–19)
Troponin T High Sensitivity: 169 ng/L (ref 0–19)

## 2024-07-26 LAB — POCT ACTIVATED CLOTTING TIME: Activated Clotting Time: 168 s

## 2024-07-26 LAB — PHOSPHORUS: Phosphorus: 2.8 mg/dL (ref 2.5–4.6)

## 2024-07-26 LAB — PRO BRAIN NATRIURETIC PEPTIDE: Pro Brain Natriuretic Peptide: 605 pg/mL — ABNORMAL HIGH (ref <300.0)

## 2024-07-26 MED ORDER — HEPARIN (PORCINE) 25000 UT/250ML-% IV SOLN
1700.0000 [IU]/h | INTRAVENOUS | Status: DC
  Administered 2024-07-26: 1700 [IU]/h via INTRAVENOUS
  Filled 2024-07-26: qty 250

## 2024-07-26 MED ORDER — ATORVASTATIN CALCIUM 40 MG PO TABS
40.0000 mg | ORAL_TABLET | Freq: Every day | ORAL | Status: DC
  Administered 2024-07-26: 40 mg via ORAL
  Filled 2024-07-26: qty 1

## 2024-07-26 MED ORDER — LACTATED RINGERS IV BOLUS
1000.0000 mL | Freq: Once | INTRAVENOUS | Status: AC
  Administered 2024-07-26: 1000 mL via INTRAVENOUS

## 2024-07-26 MED ORDER — IOHEXOL 300 MG/ML  SOLN
100.0000 mL | Freq: Once | INTRAMUSCULAR | Status: AC | PRN
  Administered 2024-07-26: 60 mL via INTRAVENOUS

## 2024-07-26 MED ORDER — INSULIN GLARGINE 100 UNIT/ML ~~LOC~~ SOLN
5.0000 [IU] | Freq: Two times a day (BID) | SUBCUTANEOUS | Status: DC
  Filled 2024-07-26: qty 0.05

## 2024-07-26 MED ORDER — LIDOCAINE-EPINEPHRINE 1 %-1:100000 IJ SOLN
20.0000 mL | Freq: Once | INTRAMUSCULAR | Status: AC
  Administered 2024-07-26: 8 mL via INTRADERMAL

## 2024-07-26 MED ORDER — NOREPINEPHRINE 4 MG/250ML-% IV SOLN
INTRAVENOUS | Status: AC
  Filled 2024-07-26: qty 250

## 2024-07-26 MED ORDER — FENTANYL CITRATE (PF) 100 MCG/2ML IJ SOLN
INTRAMUSCULAR | Status: AC
  Filled 2024-07-26: qty 2

## 2024-07-26 MED ORDER — HEPARIN SODIUM (PORCINE) 5000 UNIT/ML IJ SOLN: 5000.0000 [IU] | Freq: Three times a day (TID) | INTRAMUSCULAR | Status: DC

## 2024-07-26 MED ORDER — FENTANYL CITRATE (PF) 100 MCG/2ML IJ SOLN
INTRAMUSCULAR | Status: AC | PRN
  Administered 2024-07-26: 50 ug via INTRAVENOUS
  Administered 2024-07-26 (×2): 25 ug via INTRAVENOUS

## 2024-07-26 MED ORDER — SODIUM CHLORIDE (PF) 0.9 % IJ SOLN
INTRAMUSCULAR | Status: AC
  Filled 2024-07-26: qty 50

## 2024-07-26 MED ORDER — SODIUM CHLORIDE 0.9 % IV SOLN: 250.0000 mL | INTRAVENOUS | Status: AC

## 2024-07-26 MED ORDER — GUAIFENESIN ER 600 MG PO TB12: 600.0000 mg | ORAL_TABLET | Freq: Two times a day (BID) | ORAL | Status: DC | PRN

## 2024-07-26 MED ORDER — HEPARIN SODIUM (PORCINE) 1000 UNIT/ML IJ SOLN
INTRAMUSCULAR | Status: AC | PRN
  Administered 2024-07-26: 9000 [IU] via INTRAVENOUS

## 2024-07-26 MED ORDER — HEPARIN SODIUM (PORCINE) 1000 UNIT/ML IJ SOLN
INTRAMUSCULAR | Status: AC
  Filled 2024-07-26: qty 10

## 2024-07-26 MED ORDER — ORAL CARE MOUTH RINSE: 15.0000 mL | OROMUCOSAL | Status: DC | PRN

## 2024-07-26 MED ORDER — FUROSEMIDE 10 MG/ML IJ SOLN: 40.0000 mg | INTRAMUSCULAR | Status: AC

## 2024-07-26 MED ORDER — HEPARIN BOLUS VIA INFUSION
5000.0000 [IU] | Freq: Once | INTRAVENOUS | Status: AC
  Administered 2024-07-26: 5000 [IU] via INTRAVENOUS
  Filled 2024-07-26: qty 5000

## 2024-07-26 MED ORDER — FUROSEMIDE 10 MG/ML IJ SOLN
INTRAMUSCULAR | Status: AC
  Administered 2024-07-26: 40 mg via INTRAVENOUS
  Filled 2024-07-26: qty 4

## 2024-07-26 MED ORDER — MIDAZOLAM HCL (PF) 2 MG/2ML IJ SOLN
INTRAMUSCULAR | Status: AC | PRN
  Administered 2024-07-26: 1 mg via INTRAVENOUS
  Administered 2024-07-26 (×2): .5 mg via INTRAVENOUS

## 2024-07-26 MED ORDER — IOHEXOL 350 MG/ML SOLN
75.0000 mL | Freq: Once | INTRAVENOUS | Status: AC | PRN
  Administered 2024-07-26: 75 mL via INTRAVENOUS

## 2024-07-26 MED ORDER — ENSURE MAX PROTEIN PO LIQD
11.0000 [oz_av] | Freq: Every day | ORAL | Status: DC
  Filled 2024-07-26 (×2): qty 330

## 2024-07-26 MED ORDER — MIDAZOLAM HCL 2 MG/2ML IJ SOLN
INTRAMUSCULAR | Status: AC
  Filled 2024-07-26: qty 2

## 2024-07-26 MED ORDER — NOREPINEPHRINE 4 MG/250ML-% IV SOLN
0.0000 ug/min | INTRAVENOUS | Status: DC
  Administered 2024-07-27: 8 ug/min via INTRAVENOUS
  Administered 2024-07-27: 2 ug/min via INTRAVENOUS
  Filled 2024-07-26 (×2): qty 250

## 2024-07-26 MED ORDER — INSULIN ASPART 100 UNIT/ML IJ SOLN
0.0000 [IU] | Freq: Three times a day (TID) | INTRAMUSCULAR | Status: DC
  Filled 2024-07-26: qty 2

## 2024-07-26 MED ORDER — LIDOCAINE-EPINEPHRINE 1 %-1:100000 IJ SOLN
INTRAMUSCULAR | Status: AC
  Filled 2024-07-26: qty 1

## 2024-07-26 MED ORDER — MELATONIN 3 MG PO TABS
3.0000 mg | ORAL_TABLET | Freq: Every evening | ORAL | Status: DC | PRN
  Filled 2024-07-26: qty 1

## 2024-07-26 NOTE — Progress Notes (Addendum)
 Called to bedside reference acute desaturation into the 60's, diaphoretic, WOB/ SOB, tachypneic, and blood running down his left leg after getting up to [facility] to have bowel movement.  Pt has been getting OOB since yesterday without complication.   HR has been tachy 90-115s, SBP 140-160s, recent CBG 128  Placed on NRB with improvement into high 70's. Blood noted to be coming from left groin- where prior CVL was removed.  Manual site pressure held.   Pt denies CP, feels SOB, or nausea.  Remains responsive with unchanged slowed responses, MAE, lungs clear, diminished in bases, remains diaphoretic but improved, cooler extremities/ +1 pulses. Placed on BiPAP w/ immediate improvement in O2 saturations with large TVs 1.1- 1.2L, assisted back into bed with stedy. Once back in bed, changed over to CPAP 5 and 80% once back in bed. Remains diaphoretic.  Left groin site without further bleeding or evidence of hematoma.    CBC drawn 1558> WBC 8.6, H/H 13.6/ 41, plts 144 (previously 75 yesterday) with plans to restart SQ heparin   P:  - lasix  40mg  x 1 as 15L positive, but diureses deferred previously given Na 149 - cont CPAP for now.  Remains high risk for intubation/ further decompensation - stat CXR> clear - bedside POCUS with attending w/ concern for RV dysfunction and enlargement, LV hyperdynamic, compared to prior echo results 12/7.   - off VTE ppx heparin  since 10/7 am given plts and bedside echo findings, concerning for acute PE.  Stat CTA PE ordered.  - check ABG, lactic, BNP, coags and trop, ekg - cancel transfer out of ICU - wife updated on plan of care in waiting room.    Addendum 1539> prelim CTA positive for large acute bilateral PE.  SBP in 90's.  Peripheral NE for MAP goal > 65.  Have spoken with Dr. Jennefer with IR radiology, plans for emergent thrombectomy, calling in team now.  No absolute contraindications for systemic lytics if warranted sooner, relative with prior right femoral aline which  was d/c'd 12/9.    Dr. Landy with radiology called with CTA PE findings 1742 confirming bilateral large PE with evidence of right heart strain   CCT: 30 mins  Lyle Pesa, NP [facility] 07/26/2024, 5:12 PM  See Amion for pager If no response to pager , please call [phone] until 7pm After 7:00 pm call Elink  [phone]

## 2024-07-26 NOTE — Evaluation (Signed)
 Physical Therapy Evaluation Patient Details Name: Jorge Calhoun MRN: [id number] DOB: 1966-08-15 Today's Date: 07/26/2024  History of Present Illness  Jorge Calhoun is a 58 y/o M who presents for DKA likely in the setting of rhinovirus URTI, wife found on bathroom floor unresponsive, required invasive mechanical ventilation for metabolic encephalopathy. PMH significant for type I DM, HTN, hyperthyroidism  Clinical Impression  Pt admitted with above diagnosis.  Pt currently with functional limitations due to the deficits listed below (see PT Problem List). Pt will benefit from acute skilled PT to increase their independence and safety with mobility to allow discharge.     The patient presents with significantly slow  motor planning, verbal responses and following directions.  Patient required mod/max assistance for  bed mobility and +2 mod support to step  with Rw to recliner, very slow motor planning to complete.  Patient was independent and working PTA, resides with family.  HR max 133, from 104.  BP162/99. SPO2 on RA 955 during activity.      If plan is discharge home, recommend the following: A lot of help with walking and/or transfers;A lot of help with bathing/dressing/bathroom;Assistance with cooking/housework;Help with stairs or ramp for entrance;Assist for transportation;Direct supervision/assist for financial management   Can travel by private vehicle        Equipment Recommendations  (tbd)  Recommendations for Other Services  Rehab consult    Functional Status Assessment Patient has had a recent decline in their functional status and demonstrates the ability to make significant improvements in function in a reasonable and predictable amount of time.     Precautions / Restrictions Precautions Precautions: Fall Recall of Precautions/Restrictions: Impaired Precaution/Restrictions Comments: watch HR Restrictions Weight Bearing Restrictions Per Provider Order: No       Mobility  Bed Mobility Overal bed mobility: Needs Assistance Bed Mobility: Rolling, Sidelying to Sit Rolling: Mod assist Sidelying to sit: Mod assist       General bed mobility comments: use of bed features, cues and increased time    Transfers Overall transfer level: Needs assistance Equipment used: Rolling walker (2 wheels) Transfers: Sit to/from Stand, Bed to chair/wheelchair/BSC Sit to Stand: Mod assist, +2 physical assistance, +2 safety/equipment, From elevated surface   Step pivot transfers: Mod assist, +2 physical assistance, +2 safety/equipment, From elevated surface       General transfer comment: reliant on RW for powering up, cues for upright posture, increased time and cues needed for processing and motor planning transfer, very slow to mobilize. Able to slowly step  with small steps to turn to recliner    Ambulation/Gait                  Stairs            Wheelchair Mobility     Tilt Bed    Modified Rankin (Stroke Patients Only)       Balance Overall balance assessment: Needs assistance Sitting-balance support: Single extremity supported, Feet supported Sitting balance-Leahy Scale: Fair Sitting balance - Comments: with UE support of rail Postural control: Right lateral lean, Left lateral lean Standing balance support: Bilateral upper extremity supported, During functional activity, Reliant on assistive device for balance Standing balance-Leahy Scale: Poor Standing balance comment: signifianct forward trunk during transfer with cues for upright                             Pertinent Vitals/Pain Pain Assessment Faces Pain Scale: No hurt  Home Living Family/patient expects to be discharged to:: Private residence Living Arrangements: Spouse/significant other;Children;Other relatives Available Help at Discharge: Family Type of Home: House Home Access: Level entry     Alternate Level Stairs-Number of Steps:  flight Home Layout: Two level;Bed/bath upstairs;Full bath on main level Home Equipment: None Additional Comments: Mechainic    Prior Function Prior Level of Function : Independent/Modified Independent;Working/employed;Driving                     Extremity/Trunk Assessment   Upper Extremity Assessment Upper Extremity Assessment: Generalized weakness    Lower Extremity Assessment Lower Extremity Assessment: Generalized weakness    Cervical / Trunk Assessment Cervical / Trunk Assessment: Other exceptions Cervical / Trunk Exceptions: head forward  when sitting  Communication   Communication Communication: Impaired Factors Affecting Communication: Difficulty expressing self;Reduced clarity of speech    Cognition Arousal: Alert                             PT - Cognition Comments: very slow to respond and speak Following commands: Impaired Following commands impaired: Follows one step commands with increased time     Cueing Cueing Techniques: Verbal cues, Gestural cues, Tactile cues     General Comments General comments (skin integrity, edema, etc.): B UE>LE edema +2, HR elevated at rest 110, BP 160/106 and reached 133 bpm with transfer then recovered at rest to 108 bpm, on 2 ltrs O2 with SpO2 remaining 96-98% via [place] and final BP 162/99, left OOB in recliner with wife    Exercises     Assessment/Plan    PT Assessment Patient needs continued PT services  PT Problem List Decreased strength;Decreased cognition;Decreased range of motion;Decreased activity tolerance;Decreased safety awareness;Decreased balance;Decreased knowledge of precautions;Decreased mobility;Cardiopulmonary status limiting activity;Impaired sensation;Decreased coordination       PT Treatment Interventions DME instruction;Gait training;Cognitive remediation;Stair training;Functional mobility training;Therapeutic activities;Therapeutic exercise;Balance training    PT Goals (Current goals  can be found in the Care Plan section)  Acute Rehab PT Goals Patient Stated Goal: agreed with mobility, treturn home PT Goal Formulation: With patient/family Time For Goal Achievement: 08/09/24 Potential to Achieve Goals: Good    Frequency Min 3X/week     Co-evaluation PT/OT/SLP Co-Evaluation/Treatment: Yes Reason for Co-Treatment: For patient/therapist safety PT goals addressed during session: Mobility/safety with mobility;Balance;Proper use of DME OT goals addressed during session: ADL's and self-care;Proper use of Adaptive equipment and DME       AM-PAC PT 6 Clicks Mobility  Outcome Measure Help needed turning from your back to your side while in a flat bed without using bedrails?: A Lot Help needed moving from lying on your back to sitting on the side of a flat bed without using bedrails?: A Lot Help needed moving to and from a bed to a chair (including a wheelchair)?: A Lot Help needed standing up from a chair using your arms (e.g., wheelchair or bedside chair)?: A Lot Help needed to walk in hospital room?: Total Help needed climbing 3-5 steps with a railing? : Total 6 Click Score: 10    End of Session Equipment Utilized During Treatment: Gait belt Activity Tolerance: Patient limited by fatigue Patient left: in chair;with call bell/phone within reach;with chair alarm set;with family/visitor present Nurse Communication: Mobility status PT Visit Diagnosis: Unsteadiness on feet (R26.81);Muscle weakness (generalized) (M62.81);Difficulty in walking, not elsewhere classified (R26.2)    Time: 8954-8889 PT Time Calculation (min) (ACUTE ONLY): 25 min  Charges:   PT Evaluation $PT Eval Low Complexity: 1 Low   PT General Charges $$ ACUTE PT VISIT: 1 Visit         [name] PT Acute Rehabilitation Services Office [phone]   [name] 07/26/2024, 1:50 PM

## 2024-07-26 NOTE — Procedures (Signed)
 Arterial Catheter Insertion Procedure Note  Jorge Calhoun  [id number]  09/22/1965  Date:07/26/24  Time:6:52 PM    Provider Performing: Lyle Pesa    Procedure: Insertion of Arterial Line (63379) with US  guidance (23062)   Indication(s) Blood pressure monitoring and/or need for frequent ABGs  Consent Risks of the procedure as well as the alternatives and risks of each were explained to the patient and/or caregiver.  Consent for the procedure was obtained and is signed in the bedside chart  Anesthesia None   Time Out Verified patient identification, verified procedure, site/side was marked, verified correct patient position, special equipment/implants available, medications/allergies/relevant history reviewed, required imaging and test results available.   Sterile Technique Maximal sterile technique including full sterile barrier drape, hand hygiene, sterile gown, sterile gloves, mask, hair covering, sterile ultrasound probe cover (if used).   Procedure Description Area of catheter insertion was cleaned with chlorhexidine  and draped in sterile fashion. With real-time ultrasound guidance an arterial catheter was placed into the left radial artery.  Appropriate arterial tracings confirmed on monitor.     Complications/Tolerance None; patient tolerated the procedure well.   EBL Minimal   Specimen(s) None    Lyle Pesa, NP [facility] 07/26/2024, 6:53 PM  See Amion for pager If no response to pager , please call [phone] until 7pm After 7:00 pm call Elink  [phone]

## 2024-07-26 NOTE — Procedures (Signed)
 Extubation Procedure Note  Patient Details:   Name: Jorge Calhoun DOB: 06/27/1966 MRN: [id number]   Airway Documentation:    Vent end date: 07/25/24 Vent end time: 0848   Evaluation  O2 sats: stable throughout and currently acceptable Complications: No apparent complications Patient did tolerate procedure well. Bilateral Breath Sounds: Expiratory wheezes   Yes  Rosina HERO [facility] 07/26/2024, 10:36 AM

## 2024-07-26 NOTE — Progress Notes (Signed)
 PHARMACY - ANTICOAGULATION CONSULT NOTE  Pharmacy Consult for Heparin  Indication: Massive BL PE with RHS  Allergies  Allergen Reactions   Metformin And Related Nausea Only    Patient Measurements: Height: 5' 11 (180.3 cm) Weight: 98.2 kg (216 lb 7.9 oz) IBW/kg (Calculated) : 75.3 HEPARIN  DW (KG): 98.5 Corrected heparin  dosing weight: 95 kg  Vital Signs: Temp: 98.7 F (37.1 C) (12/10 1200) Temp Source: Oral (12/10 1200) BP: 157/85 (12/10 1500) Pulse Rate: 96 (12/10 1500)  Labs: Recent Labs    07/24/24 0400 07/24/24 0700 07/25/24 0500 07/25/24 1736 07/25/24 2253 07/26/24 0307 07/26/24 1558 07/26/24 1704  HGB 11.9*  --  12.3*  --   --   --  13.6 13.6  HCT 33.7*  --  37.0*  --   --   --  41.9 40.0  PLT 88*  --  75*  --   --   --  144*  --   CREATININE  --    < > 0.96 0.83 0.89 0.90  --   --    < > = values in this interval not displayed.    Estimated Creatinine Clearance: 105.6 mL/min (by C-G formula based on SCr of 0.9 mg/dL).   Medical History: Past Medical History:  Diagnosis Date   Hypertension    Nontoxic multinodular goiter    Obesity, unspecified    Plantar fasciitis    PSVT (paroxysmal supraventricular tachycardia)    noted on heart monitor 02/2020   Type I (juvenile type) diabetes mellitus without mention of complication, not stated as uncontrolled     Assessment: AC/Heme: SCDs/ASA only since 12/7 d/t low Plt 12/10: Acute deterioration with CTA positive for large acute bilateral PE with RHS  to start IV heparin . Noted bleeding from L groin where previous CVL was removed. Plts up to 144  Goal of Therapy:  Heparin  level 0.3-0.7 units/ml Monitor platelets by anticoagulation protocol: Yes   Plan:  Heparin  5000 units IV bolus Heparin  infusion 1700 units/hr Check heparin  level in 8 hrs after heparin  begins Daily heparin  level and CBC Emergent thrombectomy plans noted   Shaka Zech Karoline Marina, PharmD, BCPS Clinical Staff  Pharmacist Marina Salines [facility] 07/26/2024,5:55 PM

## 2024-07-26 NOTE — Progress Notes (Signed)
 NAME:  Jorge Calhoun, MRN:  [id number], DOB:  [birth year], LOS: 6 ADMISSION DATE:  07/20/2024, CONSULTATION DATE:  07/20/2024 REFERRING MD:  EDP, CHIEF COMPLAINT:  AMS   History of Present Illness:  58 year old man who presented to [facility] 12/4 after being found down unresponsive at home. PMHx significant for HTN, PSVT, T1DM, hyperthyroidism, chronic back pain.  Patient presented to the [facility] 12/3 after 3-4 days of feeling generally unwell, poor PO intake, n/v/d. Per spouse, was given something for an infection.  On day of admission (12/4), found unresponsive at home. Brought to ED via EMS; on EMS arrival patient was hypotensive with SBP 80s, SpO2 100% on 2LNC, CBG > 400. Workup in ER included negative head imaging, labs demonstrating profound hyperglycemia, DKA. Fluid resuscitation was completed, bicarb administered and patient was started on insulin  gtt.  PCCM consulted for admission, given degree of AMS and hyperglycemia.   +Rhinovirus/enterovirus. Intubated for airway protection in the setting of AMS.  Pertinent Medical History:   Past Medical History:  Diagnosis Date   Hypertension    Nontoxic multinodular goiter    Obesity, unspecified    Plantar fasciitis    PSVT (paroxysmal supraventricular tachycardia)    noted on heart monitor 02/2020   Type I (juvenile type) diabetes mellitus without mention of complication, not stated as uncontrolled    Significant Hospital Events: Including procedures, antibiotic start and stop dates in addition to other pertinent events   12/4 Admit in DKA, RVP panel positive for rhinovirus/enterovirus 12/5 Progressive decline in mentation with eventual development of unresponsiveness intubated for airway protection with central line and A-line placed as well 12/9, extubated, aline out  Interim History/Subjective:  No complaints, wife reports did not sleep well overnight  Objective:   Blood pressure (!) 172/94, pulse (!) 124, temperature 99.6 F (37.6  C), temperature source Axillary, resp. rate 20, height 5' 11 (1.803 m), weight 98.2 kg, SpO2 97%.    Vent Mode: PSV;CPAP FiO2 (%):  [30 %] 30 % PEEP:  [5 cmH20] 5 cmH20 Pressure Support:  [5 cmH20] 5 cmH20   Intake/Output Summary (Last 24 hours) at 07/26/2024 0754 Last data filed at 07/26/2024 9389 Gross per 24 hour  Intake 3864.56 ml  Output 2750 ml  Net 1114.56 ml   Filed Weights   07/24/24 0453 07/25/24 0500 07/26/24 0455  Weight: 106.8 kg 97.8 kg 98.2 kg   Physical Examination: General:  adult male lying in bed in NAD HEENT: MM pink/moist, pupils 3/r Neuro: Awake, slightly slowed responses, oriented to person, place, month, not year.  MAE CV: rr, ST 110, no murmur PULM:  non labored, clear, diminished in bases, currently on 2L GI: soft, bs+, NT, foley just removed Extremities: warm/dry, no tibial edema  Skin: no rashes  afebrile Labs Na 150> 149, K 4, sCr 0.90 CBGs 140-194/ 12hrs  UOP 2.7L/ 24hrs  2 stools +1.3L, net +14L  Resolved Problem List:  Septic shock, resolved  Assessment and Plan:   MSSA pneumonia Rhinovirus infection Sinusitis 1 out of 4 bottles +strep viridans. - echo 12/7 EF 70-75%, hyperdynamic, no RWMA, normal RV, no obvious vegetations P:  - remains hemodynamically stable and encephalopathy nearly resolved - cont of abx day 7, ceftriaxone .  Plans for 10 days - repeat [facility] 12/6> ngtd x 4 - trend WBC/ fever curve - droplet precautions - transfer out of ICU today and to [facility] as of 12/11   Respiratory insufficiency 2/2 encephalopathy r/t DKA - Extubated 12/9 AM P: - cont  to wean supplemental O2 for sat goal > 90%, down to 2L - advance diet as tolerated - pulm hygiene- IS, OOB, PT/ OT - prn CXR - abx as above  DKA, resolved Type 1 diabetes - A1c 8.8 on 12/5 P: - CBG range 194-140s - change to AC/ HS prn mSSI - lantus  10u BID - cont DM coordinator   Acute kidney injury in the setting of hypotension, prerenal 2/2  hypovolemia Hypernatremia - foley out this am, bladder scan prn - stop D5W, cont to encourage oral hydration - trend renal indices   Hypertension HLD - tele monitoring - cont toprol  xl 50mg  daily and cardizem  CD 360mg , ASA and restart statin  Thrombocytopenia  - suspect related to sepsis, timing not c/w with HIT.  H/H stable - recheck CBC and likely restart heparin  SQ - SCDs  Metabolic/ septic encephalopathy, resolved  P:  - cont supportive care - melatonin prn HS for sleep  Hyperthyroidism - TSH 0.167 on 12/4, FT4 0.66 - will need f/u with endocrinologist, repeat TSH    Critical care time:  n/a     Lyle Pesa, NP [place] [facility] 07/26/2024, 10:48 AM  See Amion for pager If no response to pager , please call [phone] until 7pm After 7:00 pm call Elink  [phone]

## 2024-07-26 NOTE — TOC Progression Note (Signed)
 Transition of Care [facility]) - Progression Note    Patient Details  Name: Jorge Calhoun MRN: [id number] Date of Birth: [birth year]  Transition of Care [facility]) CM/SW Contact  Jon ONEIDA Anon, RN Phone Number: 07/26/2024, 4:19 PM  Clinical Narrative:    PT/OT recommending Acute Inpatient Rehab at time of discharge. Consult for CIR placed. CIR stating pt is potential candidate. CIR following for possible admission. ICM following for DC planning needs.     Expected Discharge Plan: IP Rehab Facility Barriers to Discharge: Continued Medical Work up               Expected Discharge Plan and Services     Post Acute Care Choice: NA Living arrangements for the past 2 months: Single Family Home                                       Social Drivers of Health (SDOH) Interventions SDOH Screenings   Food Insecurity: No Food Insecurity (07/22/2024)  Housing: Low Risk  (07/22/2024)  Transportation Needs: Patient Unable To Answer (07/22/2024)  Utilities: Patient Unable To Answer (07/22/2024)  Tobacco Use: Low Risk  (07/20/2024)    Readmission Risk Interventions    07/24/2024    8:55 AM  Readmission Risk Prevention Plan  Post Dischage Appt Complete  Medication Screening Complete  Transportation Screening Complete

## 2024-07-26 NOTE — Progress Notes (Signed)
 Pt transported to IR on CPAP.  RT present during entire procedure. Pt returned to ICU with no events to report.  Pt taken off CPAP and placed on 4 lpm n/c SP02 is 98%.  RN aware.

## 2024-07-26 NOTE — Procedures (Addendum)
 Interventional Radiology Procedure Note  Procedure: Pulmonary artery thrombectomy  Findings: Please refer to procedural dictation for full description. 24 Fr right common femoral vein access, closed with 2 Proglides.    Large bilateral chronic appearing thrombus aspirated.    Pulmonary artery pressures (mmHg): Pre = 55/25 (mean 36) Post = 33/18 (mean 25)    Complications: None immediate  Estimated Blood Loss: 200 mL  Recommendations: 2 hours bedrest. Continue anticoagulation. Obtain bilateral lower extremity venous duplex. IR will follow.   Ester Sides, MD

## 2024-07-26 NOTE — Evaluation (Signed)
 Occupational Therapy Evaluation Patient Details Name: Jorge Calhoun MRN: [id number] DOB: Sep 17, 1965 Today's Date: 07/26/2024   History of Present Illness   Jorge Calhoun is a 58 y/o M who presents for DKA likely in the setting of rhinovirus URTI, wife found on bathroom floor unresponsive, required invasive mechanical ventilation for metabolic encephalopathy. PMH significant for type I DM, HTN, hyperthyroidism     Clinical Impressions PTA, patient lives at home with wife, son and MIL, was working FT as a curator and was completely independent with all aspects of mobility, B/IADL's including driving and working out. Wife bedside for session in ICU and reports 24 hr assist and support at home.  Currently, patient presents well below baseline function with deficits outlined below (see OT Problem List for details) most significantly decreased processing/higher level cognition, generalized muscle weakness, decreased balance, motor planning, posture, activity tolerance on 2 ltrs of supplemental O2 and mild B UE/LE edema limiting BADL's and functional mobility performance with strong potential to progress. Patient open to all therapy presented this session and patient and wife educated on LE and breathing exercises for carryover between sessions.  Patient will benefit from intensive inpatient follow-up therapy, >3 hours/day. Patient requires continued Acute care hospital level OT services to progress safety and functional performance and allow for discharge.   VSR: with nursing clearance-HR elevated at rest 110, BP 160/106 and reached 133 bpm with transfer then recovered at rest to 108 bpm, on 2 ltrs O2 with SpO2 remaining 96-98% via [place] and final BP 162/99, left OOB in recliner with wife and nursing aware      If plan is discharge home, recommend the following:   Two people to help with walking and/or transfers;A lot of help with bathing/dressing/bathroom;Assistance with  cooking/housework;Assistance with feeding;Direct supervision/assist for medications management;Direct supervision/assist for financial management;Assist for transportation;Help with stairs or ramp for entrance;Supervision due to cognitive status     Functional Status Assessment   Patient has had a recent decline in their functional status and demonstrates the ability to make significant improvements in function in a reasonable and predictable amount of time.     Equipment Recommendations   Wheelchair (measurements OT);Wheelchair cushion (measurements OT);BSC/3in1;Tub/shower bench     Recommendations for Other Services   Rehab consult     Precautions/Restrictions   Precautions Precautions: Fall Recall of Precautions/Restrictions: Impaired Precaution/Restrictions Comments: watch HR Restrictions Weight Bearing Restrictions Per Provider Order: No     Mobility Bed Mobility Overal bed mobility: Needs Assistance Bed Mobility: Supine to Sit     Supine to sit: Mod assist, HOB elevated, Used rails     General bed mobility comments: use of bed features, cues and increased time    Transfers Overall transfer level: Needs assistance Equipment used: Rolling walker (2 wheels) Transfers: Sit to/from Stand, Bed to chair/wheelchair/BSC Sit to Stand: Mod assist, +2 physical assistance, +2 safety/equipment, From elevated surface     Step pivot transfers: Mod assist, +2 physical assistance, +2 safety/equipment, From elevated surface     General transfer comment: reliant on RW for powering up, cues for upright posture, increased time and cues needed for processing and motor planning transfer      Balance Overall balance assessment: Needs assistance Sitting-balance support: Single extremity supported, Feet supported Sitting balance-Leahy Scale: Fair Sitting balance - Comments: with UE support of rail Postural control: Right lateral lean, Left lateral lean Standing balance  support: Bilateral upper extremity supported, During functional activity, Reliant on assistive device for balance Standing balance-Leahy Scale: Poor  Standing balance comment: signifianct forward trunk during transfer with cues for upright                           ADL either performed or assessed with clinical judgement   ADL Overall ADL's : Needs assistance/impaired Eating/Feeding: Moderate assistance;Sitting Eating/Feeding Details (indicate cue type and reason): fatigues easily Grooming: Wash/dry hands;Wash/dry face;Moderate assistance;Cueing for sequencing;Sitting   Upper Body Bathing: Maximal assistance;Sitting   Lower Body Bathing: Total assistance;Bed level   Upper Body Dressing : Maximal assistance;Sitting;Cueing for sequencing   Lower Body Dressing: Total assistance;Bed level   Toilet Transfer:  (deferred to recliner in ICU)   Toileting- Clothing Manipulation and Hygiene: Maximal assistance;Sitting/lateral lean Toileting - Clothing Manipulation Details (indicate cue type and reason): Foley out, set patient up with wife or nurse to assist with urinal once voids     Functional mobility during ADLs: Moderate assistance;+2 for physical assistance;+2 for safety/equipment;Rolling walker (2 wheels) General ADL Comments: unable to reach to LE's, weakness in general limits tasks with cues for processing     Vision Baseline Vision/History: 1 Wears glasses;0 No visual deficits Ability to See in Adequate Light: 0 Adequate Patient Visual Report: No change from baseline Vision Assessment?: Wears glasses for reading;Wears glasses for driving Additional Comments: denied changes, TBA with higher level activity     Perception Perception: Not tested (TBA further- in ICU for eval)       Praxis Praxis: Impaired Praxis Impairment Details: Initiation, Motor planning Praxis-Other Comments: bradykinesia   Pertinent Vitals/Pain Pain Assessment Pain Assessment: Faces (chart  review + chronic back pain but denies) Faces Pain Scale: No hurt Facial Expression: Relaxed, neutral Body Movements: Absence of movements Muscle Tension: Relaxed Compliance with ventilator (intubated pts.): N/A Vocalization (extubated pts.): Talking in normal tone or no sound CPOT Total: 0     Extremity/Trunk Assessment Upper Extremity Assessment Upper Extremity Assessment: Generalized weakness;Right hand dominant (edema B hands and forearms)   Lower Extremity Assessment Lower Extremity Assessment: Defer to PT evaluation   Cervical / Trunk Assessment Cervical / Trunk Assessment: Other exceptions (forward head and trunk in sitting and standing)   Communication Communication Communication: Impaired Factors Affecting Communication: Difficulty expressing self;Reduced clarity of speech (noted some decreased ability to manage secretions orally when focussed on transfer)   Cognition Arousal: Alert, Lethargic Behavior During Therapy:  (Flat) Cognition: Cognition impaired   Orientation impairments: Time, Situation Awareness: Intellectual awareness impaired, Online awareness impaired Memory impairment (select all impairments): Short-term memory Attention impairment (select first level of impairment): Sustained attention Executive functioning impairment (select all impairments): Initiation, Sequencing OT - Cognition Comments: awake, fluctuates between alert and mild lethargy, O x self and place, cues for time and situation, wife present to assist with responses, delayed processing especially during mobility                 Following commands: Impaired Following commands impaired: Follows one step commands with increased time     Cueing  General Comments   Cueing Techniques: Verbal cues;Gestural cues;Tactile cues  B UE>LE edema +2, HR elevated at rest 110, BP 160/106 and reached 133 bpm with transfer then recovered at rest to 108 bpm, on 2 ltrs O2 with SpO2 remaining 96-98% via  [place] and final BP 162/99, left OOB in recliner with wife   Exercises Exercises: Other exercises, General Lower Extremity General Exercises - Lower Extremity Ankle Circles/Pumps: AROM, 10 reps, Both Other Exercises Other Exercises: IS 10 reps q  hr reinforced with wife   Shoulder Instructions      Home Living Family/patient expects to be discharged to:: Private residence Living Arrangements: Spouse/significant other;Children;Other relatives Available Help at Discharge: Family Type of Home: House Home Access: Level entry     Home Layout: Two level;Bed/bath upstairs;Full bath on main level Alternate Level Stairs-Number of Steps: flight Alternate Level Stairs-Rails: Left Bathroom Shower/Tub: Walk-in shower;Tub only   Bathroom Toilet: Standard     Home Equipment: None   Additional Comments: Mechainic      Prior Functioning/Environment Prior Level of Function : Independent/Modified Independent;Working/employed;Driving                    OT Problem List: Decreased strength;Decreased activity tolerance;Impaired balance (sitting and/or standing);Decreased coordination;Decreased cognition;Decreased knowledge of use of DME or AE;Decreased knowledge of precautions;Cardiopulmonary status limiting activity;Increased edema   OT Treatment/Interventions: Self-care/ADL training;Therapeutic exercise;Neuromuscular education;Energy conservation;DME and/or AE instruction;Therapeutic activities;Cognitive remediation/compensation;Patient/family education;Balance training      OT Goals(Current goals can be found in the care plan section)   Acute Rehab OT Goals Patient Stated Goal: to get stronger OT Goal Formulation: With patient/family Time For Goal Achievement: 08/09/24 Potential to Achieve Goals: Good ADL Goals Pt Will Perform Eating: with set-up;sitting Pt Will Perform Grooming: with contact guard assist;sitting Pt Will Perform Upper Body Bathing: with min assist;sitting Pt Will  Perform Upper Body Dressing: with min assist;sitting Pt Will Transfer to Toilet: with +2 assist;stand pivot transfer;with min assist;bedside commode Pt/caregiver will Perform Home Exercise Program: Increased strength;Both right and left upper extremity;With written HEP provided;With Supervision   OT Frequency:  Min 2X/week    Co-evaluation PT/OT/SLP Co-Evaluation/Treatment: Yes Reason for Co-Treatment: For patient/therapist safety PT goals addressed during session: Mobility/safety with mobility;Balance;Proper use of DME OT goals addressed during session: ADL's and self-care;Proper use of Adaptive equipment and DME      AM-PAC OT 6 Clicks Daily Activity     Outcome Measure Help from another person eating meals?: A Lot Help from another person taking care of personal grooming?: A Lot Help from another person toileting, which includes using toliet, bedpan, or urinal?: A Lot Help from another person bathing (including washing, rinsing, drying)?: A Lot Help from another person to put on and taking off regular upper body clothing?: A Lot Help from another person to put on and taking off regular lower body clothing?: Total 6 Click Score: 11   End of Session Equipment Utilized During Treatment: Gait belt;Rolling walker (2 wheels);Oxygen Nurse Communication: Mobility status  Activity Tolerance: Patient tolerated treatment well Patient left: in chair;with call bell/phone within reach;with chair alarm set;with family/visitor present  OT Visit Diagnosis: Unsteadiness on feet (R26.81);Muscle weakness (generalized) (M62.81);Cognitive communication deficit (R41.841)                Time: 8957-8889 OT Time Calculation (min): 28 min Charges:  OT General Charges $OT Visit: 1 Visit OT Evaluation $OT Eval Moderate Complexity: 1 Mod  Shota Kohrs OT/L Acute Rehabilitation Department  [phone]  07/26/2024, 12:28 PM

## 2024-07-26 NOTE — Consult Note (Signed)
 Chief Complaint: Acute pulmonary embolism  Referring Physician(s): Paula Combes, MD  Patient Status: Jorge Calhoun - In-pt  History of Present Illness: Jorge Calhoun is a 58 y.o. male current admitted to the [facility] ICU with respiratory infection who experienced onset of significantly increased shortness of breath this evening with associated hypoxia and hypotension.  CTA chest was obtained which revealed acute appearing bilateral pulmonary emboli with associated right heart strain.  IR was called for possible intervention.     History of prior venous thromboembolism? no Currently taking any anticoagulation? no Any recent surgery or immobilization? no Any recent illness? yes Recent travel with prolonged sitting? no Taking oral contraceptives/hormone supplementation? N/a Smoking history? no  Past Medical History:  Diagnosis Date   Hypertension    Nontoxic multinodular goiter    Obesity, unspecified    Plantar fasciitis    PSVT (paroxysmal supraventricular tachycardia)    noted on heart monitor 02/2020   Type I (juvenile type) diabetes mellitus without mention of complication, not stated as uncontrolled     History reviewed. No pertinent surgical history.  Allergies: Metformin and related  Medications: Prior to Admission medications   Medication Sig Start Date End Date Taking? Authorizing Provider  amoxicillin-clavulanate (AUGMENTIN) 875-125 MG tablet Take 1 tablet by mouth 2 (two) times daily. 07/19/24  Yes [provider]  aspirin  81 MG tablet Take 81 mg by mouth daily.   Yes [provider]  atorvastatin  (LIPITOR) 40 MG tablet Take 1 tablet (40 mg total) by mouth daily. 03/29/24  Yes Dunn, Dayna N, PA-C  benzonatate (TESSALON) 100 MG capsule Take 200 mg by mouth 3 (three) times daily as needed for cough. 07/19/24  Yes [provider]  chlorpheniramine (CHLOR-TRIMETON) 4 MG tablet Take 4 mg by mouth every 4 (four) hours as needed (Drainage). 07/19/24   Yes [provider]  chlorthalidone  (HYGROTON ) 25 MG tablet Take 1 tablet (25 mg total) by mouth daily. 06/06/24  Yes Turner, Wilbert SAUNDERS, MD  diltiazem  (CARDIZEM  CD) 360 MG 24 hr capsule Take 1 capsule (360 mg total) by mouth daily. Patient taking differently: Take 300 mg by mouth daily. 10/12/23  Yes Turner, Wilbert SAUNDERS, MD  empagliflozin  (JARDIANCE ) 25 MG TABS tablet Take 12.5 mg by mouth daily. 11/26/22  Yes [provider]  guaiFENesin (MUCINEX) 600 MG 12 hr tablet Take 600 mg by mouth 2 (two) times daily as needed (Sinus/Chest Congestion). 07/19/24  Yes [provider]  insulin  lispro (HUMALOG) 100 UNIT/ML injection Inject 5 Units into the skin daily.   Yes [provider]  metoprolol  succinate (TOPROL  XL) 50 MG 24 hr tablet Take 1 tablet (50 mg total) by mouth daily. Take with or immediately following a meal. 04/18/24  Yes Turner, Wilbert SAUNDERS, MD  Multiple Vitamin (MULTIVITAMIN) capsule Take 1 capsule by mouth daily.   Yes [provider]  ondansetron  (ZOFRAN -ODT) 4 MG disintegrating tablet Take 4 mg by mouth 3 (three) times daily as needed for nausea or vomiting. 07/19/24  Yes [provider]  potassium chloride  SA (KLOR-CON  M20) 20 MEQ tablet Take 2 tablets (40 mEq total) by mouth daily. 01/19/23  Yes Dunn, Dayna N, PA-C  TRESIBA FLEXTOUCH 100 UNIT/ML FlexTouch Pen Inject 24 Units into the skin daily. 01/07/20  Yes [provider]  meloxicam (MOBIC) 15 MG tablet Take 15 mg by mouth as needed for pain. Patient not taking: Reported on 07/21/2024 11/25/22   [provider]     Family History  Problem Relation Age  of Onset   Hypertension Mother    Hypertension Father    Diabetes Father    Cancer - Ovarian Maternal Grandmother    Diabetes Paternal Grandmother     Social History   Socioeconomic History   Marital status: Married    Spouse name: Not on file   Number of children: Not on file   Years of education: Not on file   Highest  education level: Not on file  Occupational History   Not on file  Tobacco Use   Smoking status: Never   Smokeless tobacco: Never  Substance and Sexual Activity   Alcohol use: Not on file   Drug use: Not on file   Sexual activity: Not on file  Other Topics Concern   Not on file  Social History Narrative   Not on file   Social Drivers of Health   Financial Resource Strain: Not on file  Food Insecurity: No Food Insecurity (07/22/2024)   Hunger Vital Sign    Worried About Running Out of Food in the Last Year: Never true    Ran Out of Food in the Last Year: Never true  Transportation Needs: Patient Unable To Answer (07/22/2024)   PRAPARE - Administrator, Civil Service (Medical): Patient unable to answer    Lack of Transportation (Non-Medical): Patient unable to answer  Physical Activity: Not on file  Stress: Not on file  Social Connections: Not on file     Review of Systems: A 12 point ROS discussed and pertinent positives are indicated in the HPI above.  All other systems are negative.  Vital Signs: BP (!) 157/85   Pulse 96   Temp 98.8 F (37.1 C) (Oral)   Resp 17   Ht 5' 11 (1.803 m)   Wt 98.2 kg   SpO2 99%   BMI 30.19 kg/m   Physical Exam Constitutional:      General: He is in acute distress.     Appearance: He is ill-appearing.  HENT:     Head: Normocephalic.     Nose: Nose normal.     Mouth/Throat:     Mouth: Mucous membranes are moist.     Comments: Non-rebreather in place Eyes:     General: No scleral icterus. Cardiovascular:     Rate and Rhythm: Tachycardia present.     Pulses: Normal pulses.  Pulmonary:     Effort: Respiratory distress present.  Abdominal:     General: There is no distension.  Musculoskeletal:     Right lower leg: No edema.     Left lower leg: No edema.  Skin:    General: Skin is warm and dry.  Neurological:     Mental Status: He is alert and oriented to person, place, and time.     Imaging: CTA Chest:   07/26/24     Thrombus distribution: bilateral distal main PAs RV:LV = ~2.0  Echocardiogram: most recent 07/23/24 prior to PE  Bilateral lower extremity venous duplex: not obtained  Labs:  BNP: not yet obtained Troponin: not yet obtained  CBC: Recent Labs    07/23/24 0613 07/23/24 1026 07/24/24 0400 07/25/24 0500 07/26/24 1558 07/26/24 1704  WBC 8.3  --  6.0 6.0 8.6  --   HGB 12.8*   < > 11.9* 12.3* 13.6 13.6  HCT 37.0*   < > 33.7* 37.0* 41.9 40.0  PLT 83*  --  88* 75* 144*  --    < > = values in this interval  not displayed.    COAGS: No results for input(s): INR, APTT in the last 8760 hours.  BMP: Recent Labs    07/25/24 0500 07/25/24 1736 07/25/24 2253 07/26/24 0307 07/26/24 1704  NA 152* 149* 150* 149* 146*  K 3.5 3.6 3.6 4.0 3.1*  CL 113* 108 107 108  --   CO2 32 25 25 23   --   GLUCOSE 220* 154* 158* 165*  --   BUN 19 21* 20 22*  --   CALCIUM  8.4* 8.5* 8.5* 8.6*  --   CREATININE 0.96 0.83 0.89 0.90  --   GFRNONAA >60 >60 >60 >60  --     LIVER FUNCTION TESTS: Recent Labs    03/16/24 1521 07/20/24 2330 07/22/24 1025  BILITOT  --  2.3* 0.3  AST  --  35 53*  ALT 15 22 25   ALKPHOS  --  79 76  PROT  --  5.6* 5.0*  ALBUMIN  --  2.9* 3.0*     PESI Score (bridalfinder.es): 159, Class V    Assessment and Plan: ZAEVION PARKE is a 58 y.o. male currently admitted with respiratory infection suffering acute, [employer] Society [employer]iology high risk acute pulmonary embolism.  Distribution is amenable to mechanical thrombectomy.    Risks and benefits of pulmonary thrombectomy were discussed with the patient including, but not limited to bleeding, infection, vascular injury, contrast induced renal failure, and/or death.  All of the patient's questions were answered, patient is agreeable to proceed.  Consent signed and in chart.    Electronically Signed: Ester JINNY Sides,  MD 07/26/2024, 7:17 PM   I spent a total of 40 Minutes in face to face in clinical consultation, greater than 50% of which was counseling/coordinating care for acute pulmonary embolism.

## 2024-07-26 NOTE — Progress Notes (Signed)
 eLink Physician-Brief Progress Note Patient Name: DEKLAN MINAR DOB: June 12, 1966 MRN: [id number]   Date of Service  07/26/2024  HPI/Events of Note  Heart rate 120's, recent echocardiogram was hyperdynamic with EF 70 - 75 %. Anion gap up to 18 from 6.  eICU Interventions  LR 1000 ml iv x 1.        Jaidyn Usery U Raynelle Fujikawa 07/26/2024, 2:17 AM

## 2024-07-26 NOTE — Progress Notes (Signed)
 eLink Physician-Brief Progress Note Patient Name: Jorge Calhoun DOB: May 13, 1966 MRN: [id number]   Date of Service  07/26/2024  HPI/Events of Note  Troponin 147 in the context of bilateral PE.  eICU Interventions  Trend Troponin, EKG, Echocardiogram, patient is pending thrombectomy.        Rollo Farquhar U Yanessa Hocevar 07/26/2024, 8:30 PM

## 2024-07-26 NOTE — Plan of Care (Signed)
°  Problem: Education: Goal: Knowledge of General Education information will improve Description: Including pain rating scale, medication(s)/side effects and non-pharmacologic comfort measures Outcome: Progressing   Problem: Health Behavior/Discharge Planning: Goal: Ability to manage health-related needs will improve Outcome: Progressing   Problem: Clinical Measurements: Goal: Will remain free from infection Outcome: Progressing Goal: Diagnostic test results will improve Outcome: Progressing Goal: Cardiovascular complication will be avoided Outcome: Progressing   Problem: Activity: Goal: Risk for activity intolerance will decrease Outcome: Progressing   Problem: Elimination: Goal: Will not experience complications related to bowel motility Outcome: Progressing Goal: Will not experience complications related to urinary retention Outcome: Progressing

## 2024-07-26 NOTE — Progress Notes (Signed)

## 2024-07-27 ENCOUNTER — Inpatient Hospital Stay (HOSPITAL_COMMUNITY)

## 2024-07-27 ENCOUNTER — Inpatient Hospital Stay: Payer: Self-pay

## 2024-07-27 DIAGNOSIS — Z794 Long term (current) use of insulin: Secondary | ICD-10-CM

## 2024-07-27 DIAGNOSIS — E119 Type 2 diabetes mellitus without complications: Secondary | ICD-10-CM | POA: Diagnosis not present

## 2024-07-27 LAB — CULTURE, BLOOD (ROUTINE X 2)
Culture: NO GROWTH
Culture: NO GROWTH
Special Requests: ADEQUATE
Special Requests: ADEQUATE

## 2024-07-27 LAB — BASIC METABOLIC PANEL WITH GFR
Anion gap: 32 — ABNORMAL HIGH (ref 5–15)
Anion gap: 25 — ABNORMAL HIGH (ref 5–15)
Anion gap: 12 (ref 5–15)
Anion gap: 17 — ABNORMAL HIGH (ref 5–15)
BUN: 32 mg/dL — ABNORMAL HIGH (ref 6–20)
BUN: 35 mg/dL — ABNORMAL HIGH (ref 6–20)
BUN: 36 mg/dL — ABNORMAL HIGH (ref 6–20)
BUN: 30 mg/dL — ABNORMAL HIGH (ref 6–20)
BUN: 27 mg/dL — ABNORMAL HIGH (ref 6–20)
CO2: 9 mmol/L — ABNORMAL LOW (ref 22–32)
CO2: <7 mmol/L — ABNORMAL LOW (ref 22–32)
CO2: 13 mmol/L — ABNORMAL LOW (ref 22–32)
CO2: 23 mmol/L (ref 22–32)
CO2: 19 mmol/L — ABNORMAL LOW (ref 22–32)
Calcium: 8.1 mg/dL — ABNORMAL LOW (ref 8.9–10.3)
Calcium: 8.2 mg/dL — ABNORMAL LOW (ref 8.9–10.3)
Calcium: 8.3 mg/dL — ABNORMAL LOW (ref 8.9–10.3)
Calcium: 8 mg/dL — ABNORMAL LOW (ref 8.9–10.3)
Calcium: 7.7 mg/dL — ABNORMAL LOW (ref 8.9–10.3)
Chloride: 109 mmol/L (ref 98–111)
Chloride: 109 mmol/L (ref 98–111)
Chloride: 114 mmol/L — ABNORMAL HIGH (ref 98–111)
Chloride: 118 mmol/L — ABNORMAL HIGH (ref 98–111)
Chloride: 112 mmol/L — ABNORMAL HIGH (ref 98–111)
Creatinine, Ser: 1.28 mg/dL — ABNORMAL HIGH (ref 0.61–1.24)
Creatinine, Ser: 1.42 mg/dL — ABNORMAL HIGH (ref 0.61–1.24)
Creatinine, Ser: 1.45 mg/dL — ABNORMAL HIGH (ref 0.61–1.24)
Creatinine, Ser: 1.13 mg/dL (ref 0.61–1.24)
Creatinine, Ser: 1.09 mg/dL (ref 0.61–1.24)
GFR, Estimated: >60 mL/min (ref >60)
GFR, Estimated: 57 mL/min — ABNORMAL LOW (ref >60)
GFR, Estimated: 56 mL/min — ABNORMAL LOW (ref >60)
GFR, Estimated: >60 mL/min (ref >60)
GFR, Estimated: >60 mL/min (ref >60)
Glucose, Bld: 238 mg/dL — ABNORMAL HIGH (ref 70–99)
Glucose, Bld: 263 mg/dL — ABNORMAL HIGH (ref 70–99)
Glucose, Bld: 202 mg/dL — ABNORMAL HIGH (ref 70–99)
Glucose, Bld: 102 mg/dL — ABNORMAL HIGH (ref 70–99)
Glucose, Bld: 306 mg/dL — ABNORMAL HIGH (ref 70–99)
Potassium: 4.6 mmol/L (ref 3.5–5.1)
Potassium: 4.9 mmol/L (ref 3.5–5.1)
Potassium: 4.2 mmol/L (ref 3.5–5.1)
Potassium: 3.6 mmol/L (ref 3.5–5.1)
Potassium: 3.4 mmol/L — ABNORMAL LOW (ref 3.5–5.1)
Sodium: 150 mmol/L — ABNORMAL HIGH (ref 135–145)
Sodium: 149 mmol/L — ABNORMAL HIGH (ref 135–145)
Sodium: 152 mmol/L — ABNORMAL HIGH (ref 135–145)
Sodium: 153 mmol/L — ABNORMAL HIGH (ref 135–145)
Sodium: 148 mmol/L — ABNORMAL HIGH (ref 135–145)

## 2024-07-27 LAB — GLUCOSE, CAPILLARY
Glucose-Capillary: 236 mg/dL — ABNORMAL HIGH (ref 70–99)
Glucose-Capillary: 213 mg/dL — ABNORMAL HIGH (ref 70–99)
Glucose-Capillary: 234 mg/dL — ABNORMAL HIGH (ref 70–99)
Glucose-Capillary: 190 mg/dL — ABNORMAL HIGH (ref 70–99)
Glucose-Capillary: 241 mg/dL — ABNORMAL HIGH (ref 70–99)
Glucose-Capillary: 134 mg/dL — ABNORMAL HIGH (ref 70–99)
Glucose-Capillary: 243 mg/dL — ABNORMAL HIGH (ref 70–99)
Glucose-Capillary: 267 mg/dL — ABNORMAL HIGH (ref 70–99)
Glucose-Capillary: 117 mg/dL — ABNORMAL HIGH (ref 70–99)
Glucose-Capillary: 99 mg/dL (ref 70–99)
Glucose-Capillary: 122 mg/dL — ABNORMAL HIGH (ref 70–99)
Glucose-Capillary: 133 mg/dL — ABNORMAL HIGH (ref 70–99)
Glucose-Capillary: 143 mg/dL — ABNORMAL HIGH (ref 70–99)
Glucose-Capillary: 141 mg/dL — ABNORMAL HIGH (ref 70–99)

## 2024-07-27 LAB — BLOOD GAS, ARTERIAL
Acid-base deficit: 4.5 mmol/L — ABNORMAL HIGH (ref 0.0–2.0)
Bicarbonate: 19.2 mmol/L — ABNORMAL LOW (ref 20.0–28.0)
Delivery systems: POSITIVE
Drawn by: 213381
Expiratory PAP: 5 cmH2O
FIO2: 40 %
O2 Saturation: 99.5 %
Patient temperature: 37.1
pCO2 arterial: 31 mmHg — ABNORMAL LOW (ref 32–48)
pH, Arterial: 7.4 (ref 7.35–7.45)
pO2, Arterial: 103 mmHg (ref 83–108)

## 2024-07-27 LAB — COOXEMETRY PANEL
Carboxyhemoglobin: 1.3 % (ref 0.5–1.5)
Methemoglobin: 0.8 % (ref 0.0–1.5)
O2 Saturation: 85.7 %
Total hemoglobin: 12.7 g/dL (ref 12.0–16.0)

## 2024-07-27 LAB — POCT I-STAT 7, (LYTES, BLD GAS, ICA,H+H)
Acid-base deficit: 19 mmol/L — ABNORMAL HIGH (ref 0.0–2.0)
Bicarbonate: 6.8 mmol/L — ABNORMAL LOW (ref 20.0–28.0)
Calcium, Ion: 1.18 mmol/L (ref 1.15–1.40)
HCT: 36 % — ABNORMAL LOW (ref 39.0–52.0)
Hemoglobin: 12.2 g/dL — ABNORMAL LOW (ref 13.0–17.0)
O2 Saturation: 97 %
Patient temperature: 100
Potassium: 4.6 mmol/L (ref 3.5–5.1)
Sodium: 148 mmol/L — ABNORMAL HIGH (ref 135–145)
TCO2: 7 mmol/L — ABNORMAL LOW (ref 22–32)
pCO2 arterial: 17.6 mmHg — CL (ref 32–48)
pH, Arterial: 7.202 — ABNORMAL LOW (ref 7.35–7.45)
pO2, Arterial: 112 mmHg — ABNORMAL HIGH (ref 83–108)

## 2024-07-27 LAB — MAGNESIUM: Magnesium: 2.6 mg/dL — ABNORMAL HIGH (ref 1.7–2.4)

## 2024-07-27 LAB — ECHOCARDIOGRAM COMPLETE
Area-P 1/2: 4.07 cm2
Height: 71 in
S' Lateral: 1.9 cm
Weight: 3224.01 [oz_av]

## 2024-07-27 LAB — HEPATIC FUNCTION PANEL
ALT: 26 U/L (ref 0–44)
AST: 28 U/L (ref 15–41)
Albumin: 3 g/dL — ABNORMAL LOW (ref 3.5–5.0)
Alkaline Phosphatase: 71 U/L (ref 38–126)
Bilirubin, Direct: 0.1 mg/dL (ref 0.0–0.2)
Indirect Bilirubin: 0.2 mg/dL — ABNORMAL LOW (ref 0.3–0.9)
Total Bilirubin: 0.3 mg/dL (ref 0.0–1.2)
Total Protein: 5.2 g/dL — ABNORMAL LOW (ref 6.5–8.1)

## 2024-07-27 LAB — CBC
HCT: 39.9 % (ref 39.0–52.0)
Hemoglobin: 12.5 g/dL — ABNORMAL LOW (ref 13.0–17.0)
MCH: 31.3 pg (ref 26.0–34.0)
MCHC: 31.3 g/dL (ref 30.0–36.0)
MCV: 100 fL (ref 80.0–100.0)
Platelets: 196 K/uL (ref 150–400)
RBC: 3.99 MIL/uL — ABNORMAL LOW (ref 4.22–5.81)
RDW: 14.8 % (ref 11.5–15.5)
WBC: 13.6 K/uL — ABNORMAL HIGH (ref 4.0–10.5)
nRBC: 0 % (ref 0.0–0.2)

## 2024-07-27 LAB — BETA-HYDROXYBUTYRIC ACID
Beta-Hydroxybutyric Acid: >8 mmol/L — ABNORMAL HIGH (ref 0.05–0.27)
Beta-Hydroxybutyric Acid: 2.68 mmol/L — ABNORMAL HIGH (ref 0.05–0.27)

## 2024-07-27 LAB — TROPONIN T, HIGH SENSITIVITY: Troponin T High Sensitivity: 68 ng/L — ABNORMAL HIGH (ref 0–19)

## 2024-07-27 LAB — HEPARIN LEVEL (UNFRACTIONATED)
Heparin Unfractionated: >1.1 [IU]/mL — ABNORMAL HIGH (ref 0.30–0.70)
Heparin Unfractionated: 0.94 [IU]/mL — ABNORMAL HIGH (ref 0.30–0.70)
Heparin Unfractionated: 0.54 [IU]/mL (ref 0.30–0.70)

## 2024-07-27 LAB — LACTIC ACID, PLASMA: Lactic Acid, Venous: 1.8 mmol/L (ref 0.5–1.9)

## 2024-07-27 MED ORDER — INSULIN ASPART 100 UNIT/ML IJ SOLN
0.0000 [IU] | INTRAMUSCULAR | Status: DC
  Administered 2024-07-27: 5 [IU] via SUBCUTANEOUS
  Filled 2024-07-27: qty 5

## 2024-07-27 MED ORDER — DEXTROSE IN LACTATED RINGERS 5 % IV SOLN: INTRAVENOUS | Status: DC

## 2024-07-27 MED ORDER — SODIUM CHLORIDE 0.9 % IV SOLN
2.0000 g | Freq: Every day | INTRAVENOUS | Status: AC
  Administered 2024-07-27 – 2024-07-31 (×5): 2 g via INTRAVENOUS
  Filled 2024-07-27 (×5): qty 20

## 2024-07-27 MED ORDER — SODIUM BICARBONATE 8.4 % IV SOLN
INTRAVENOUS | Status: DC
  Filled 2024-07-27: qty 1000
  Filled 2024-07-27: qty 150

## 2024-07-27 MED ORDER — INSULIN REGULAR(HUMAN) IN NACL 100-0.9 UT/100ML-% IV SOLN
INTRAVENOUS | Status: DC
  Administered 2024-07-27: 9.5 [IU]/h via INTRAVENOUS
  Filled 2024-07-27 (×2): qty 100

## 2024-07-27 MED ORDER — LACTATED RINGERS IV BOLUS: 1000.0000 mL | Freq: Once | INTRAVENOUS | Status: DC

## 2024-07-27 MED ORDER — POTASSIUM CHLORIDE 10 MEQ/50ML IV SOLN
10.0000 meq | INTRAVENOUS | Status: AC
  Administered 2024-07-27 (×2): 10 meq via INTRAVENOUS
  Filled 2024-07-27 (×2): qty 50

## 2024-07-27 MED ORDER — SODIUM CHLORIDE 0.9% FLUSH
10.0000 mL | Freq: Two times a day (BID) | INTRAVENOUS | Status: DC
  Administered 2024-07-27 (×2): 30 mL
  Administered 2024-07-28 – 2024-07-29 (×3): 10 mL
  Administered 2024-07-29: 30 mL
  Administered 2024-07-30 – 2024-08-07 (×7): 10 mL
  Administered 2024-08-08: 40 mL
  Administered 2024-08-08 – 2024-08-14 (×7): 10 mL

## 2024-07-27 MED ORDER — POTASSIUM CHLORIDE 10 MEQ/100ML IV SOLN
10.0000 meq | INTRAVENOUS | Status: AC
  Administered 2024-07-27 (×2): 10 meq via INTRAVENOUS
  Filled 2024-07-27 (×2): qty 100

## 2024-07-27 MED ORDER — LACTATED RINGERS IV BOLUS
500.0000 mL | Freq: Once | INTRAVENOUS | Status: AC
  Administered 2024-07-27: 500 mL via INTRAVENOUS

## 2024-07-27 MED ORDER — DEXTROSE 5 % IV SOLN: INTRAVENOUS | Status: AC

## 2024-07-27 MED ORDER — HEPARIN (PORCINE) 25000 UT/250ML-% IV SOLN
1300.0000 [IU]/h | INTRAVENOUS | Status: AC
  Administered 2024-07-27: 1400 [IU]/h via INTRAVENOUS
  Administered 2024-07-28 – 2024-07-29 (×2): 1200 [IU]/h via INTRAVENOUS
  Administered 2024-07-30 (×2): 1300 [IU]/h via INTRAVENOUS
  Filled 2024-07-27 (×5): qty 250

## 2024-07-27 MED ORDER — INSULIN GLARGINE 100 UNIT/ML ~~LOC~~ SOLN
10.0000 [IU] | Freq: Two times a day (BID) | SUBCUTANEOUS | Status: DC
  Filled 2024-07-27: qty 0.1

## 2024-07-27 MED ORDER — LACTATED RINGERS IV SOLN: INTRAVENOUS | Status: DC

## 2024-07-27 MED ORDER — SODIUM CHLORIDE 0.9% FLUSH
10.0000 mL | INTRAVENOUS | Status: DC | PRN
  Administered 2024-08-02: 09:00:00 10 mL

## 2024-07-27 NOTE — Progress Notes (Signed)
 PHARMACY - ANTICOAGULATION CONSULT NOTE  Pharmacy Consult for Heparin  Indication: Massive BL PE with RHS  Allergies  Allergen Reactions   Metformin And Related Nausea Only    Patient Measurements: Height: 5' 11 (180.3 cm) Weight: 91.4 kg (201 lb 8 oz) IBW/kg (Calculated) : 75.3 HEPARIN  DW (KG): 98.5 Corrected heparin  dosing weight: 95 kg  Vital Signs: Temp: 99 F (37.2 C) (12/11 0458) Temp Source: Axillary (12/11 0458) BP: 125/68 (12/11 0400) Pulse Rate: 109 (12/11 0400)  Labs: Recent Labs    07/25/24 0500 07/25/24 1736 07/25/24 2253 07/26/24 0307 07/26/24 1558 07/26/24 1704 07/26/24 1902 07/27/24 0449  HGB 12.3*  --   --   --  13.6 13.6  --  12.5*  HCT 37.0*  --   --   --  41.9 40.0  --  39.9  PLT 75*  --   --   --  144*  --   --  196  LABPROT  --   --   --   --   --   --  16.1*  --   INR  --   --   --   --   --   --  1.2  --   HEPARINUNFRC  --   --   --   --   --   --   --  >1.10*  CREATININE 0.96 0.83 0.89 0.90  --   --   --   --     Estimated Creatinine Clearance: 102.1 mL/min (by C-G formula based on SCr of 0.9 mg/dL).   Medical History: Past Medical History:  Diagnosis Date   Hypertension    Nontoxic multinodular goiter    Obesity, unspecified    Plantar fasciitis    PSVT (paroxysmal supraventricular tachycardia)    noted on heart monitor 02/2020   Type I (juvenile type) diabetes mellitus without mention of complication, not stated as uncontrolled     Assessment: AC/Heme: SCDs/ASA only since 12/7 d/t low Plt 12/10: Acute deterioration with CTA positive for large acute bilateral PE with RHS  to start IV heparin . Noted bleeding from L groin where previous CVL was removed. Plts up to 144  Heparin  level > 1.1 supra-therapeutic on 1700 units/hr Hgb 12.5, plts 196 Per RN no bleeding   Goal of Therapy:  Heparin  level 0.3-0.7 units/ml Monitor platelets by anticoagulation protocol: Yes   Plan:  Hold heparin  x 1 hour then Decrease heparin  infusion  to 1400 units/hr Check heparin  level in 8 hrs  Daily heparin  level and CBC Emergent thrombectomy plans noted   Leeroy Mace RPh 07/27/2024, 5:23 AM

## 2024-07-27 NOTE — Progress Notes (Signed)
 Bilateral lower extremity venous duplex has been completed. Preliminary results can be found in CV Proc through chart review.   07/27/2024 11:08 AM Cathlyn Collet RVT

## 2024-07-27 NOTE — Progress Notes (Signed)
 Peripherally Inserted Central Catheter Placement  The IV Nurse has discussed with the patient and/or persons authorized to consent for the patient, the purpose of this procedure and the potential benefits and risks involved with this procedure.  The benefits include less needle sticks, lab draws from the catheter, and the patient may be discharged home with the catheter. Risks include, but not limited to, infection, bleeding, blood clot (thrombus formation), and puncture of an artery; nerve damage and irregular heartbeat and possibility to perform a PICC exchange if needed/ordered by physician.  Alternatives to this procedure were also discussed.  Bard Power PICC patient education guide, fact sheet on infection prevention and patient information card has been provided to patient /or left at bedside.    PICC Placement Documentation  PICC Triple Lumen 07/27/24 Right Brachial 42 cm 0 cm (Active)  Indication for Insertion or Continuance of Line Vasoactive infusions 07/27/24 1039  Exposed Catheter (cm) 0 cm 07/27/24 1039  Site Assessment Clean, Dry, Intact 07/27/24 1039  Lumen #1 Status Flushed;Saline locked;Blood return noted 07/27/24 1039  Lumen #2 Status Flushed;Saline locked;Blood return noted 07/27/24 1039  Lumen #3 Status Flushed;Saline locked;Blood return noted 07/27/24 1039  Dressing Type Transparent;Securing device 07/27/24 1039  Dressing Status Antimicrobial disc/dressing in place;Clean, Dry, Intact 07/27/24 1039  Line Care Connections checked and tightened 07/27/24 1039  Line Adjustment (NICU/IV Team Only) No 07/27/24 1039  Dressing Intervention New dressing;Adhesive placed at insertion site (IV team only) 07/27/24 1039  Dressing Change Due 08/03/24 07/27/24 1039    Patient's wife, Conor Lata, signed PICC consent at bedside prior to insertion.   Kyleeann Cremeans 07/27/2024, 10:40 AM

## 2024-07-27 NOTE — Progress Notes (Signed)
°  Echocardiogram 2D Echocardiogram has been performed.  Jorge Calhoun 07/27/2024, 8:26 AM

## 2024-07-27 NOTE — Progress Notes (Signed)
 PHARMACY - ANTICOAGULATION CONSULT NOTE  Pharmacy Consult for Heparin  Indication: Massive BL PE with RHS  Allergies  Allergen Reactions   Jardiance  [Empagliflozin ] Other (See Comments)    Precipitated DKA   Metformin And Related Nausea Only    Patient Measurements: Height: 5' 11 (180.3 cm) Weight: 91.4 kg (201 lb 8 oz) IBW/kg (Calculated) : 75.3 HEPARIN  DW (KG): 98.5 Corrected heparin  dosing weight: 95 kg  Vital Signs: Temp: 99.8 F (37.7 C) (12/11 2001) Temp Source: Axillary (12/11 2001) BP: 118/72 (12/11 1145) Pulse Rate: 105 (12/11 2049)  Labs: Recent Labs    07/25/24 0500 07/25/24 1736 07/26/24 1558 07/26/24 1704 07/26/24 1902 07/27/24 0449 07/27/24 0751 07/27/24 0807 07/27/24 1136 07/27/24 1430 07/27/24 1805 07/27/24 2015 07/27/24 2157  HGB 12.3*  --  13.6 13.6  --  12.5*  --  12.2*  --   --   --   --   --   HCT 37.0*  --  41.9 40.0  --  39.9  --  36.0*  --   --   --   --   --   PLT 75*  --  144*  --   --  196  --   --   --   --   --   --   --   LABPROT  --   --   --   --  16.1*  --   --   --   --   --   --   --   --   INR  --   --   --   --  1.2  --   --   --   --   --   --   --   --   HEPARINUNFRC  --   --   --   --   --  >1.10*  --   --   --  0.94*  --   --  0.54  CREATININE 0.96   < >  --   --   --  1.28*   < >  --  1.45*  --  1.13 1.09  --    < > = values in this interval not displayed.    Estimated Creatinine Clearance: 84.3 mL/min (by C-G formula based on SCr of 1.09 mg/dL).   Medical History: Past Medical History:  Diagnosis Date   Hypertension    Nontoxic multinodular goiter    Obesity, unspecified    Plantar fasciitis    PSVT (paroxysmal supraventricular tachycardia)    noted on heart monitor 02/2020   Type I (juvenile type) diabetes mellitus without mention of complication, not stated as uncontrolled     Assessment: AC/Heme: SCDs/ASA only since 12/7 d/t low Plt 12/10: Acute deterioration with CTA positive for large acute bilateral  PE with RHS  to start IV heparin . Noted bleeding from L groin where previous CVL was removed. Plts up to 144   Today, 07/27/2024 14:30 Heparin  level = 0.94, remains supra-therapeutic at reduced rate of 1400 units/hr Hgb 12.5, plts 196 Per RN no bleeding or other heparin  related issues  2157 heparin  level therapeutic at 0.54 Per RN no bleeding noted   Goal of Therapy:  Heparin  level 0.3-0.7 units/ml Monitor platelets by anticoagulation protocol: Yes   Plan:  continue heparin  infusion at 1200 units/hr Check heparin  level in 6 hrs  Monitor daily heparin  level, CBC, signs/symptoms of bleeding    Thank you for allowing pharmacy to be a part  of this patients care.  Leeroy Mace RPh 07/27/2024, 10:28 PM

## 2024-07-27 NOTE — Progress Notes (Signed)
 PCCM afternoon rounds   DKA improving from AM with fluids, insulin  gtt and bicarb. Suspect could have been related to jardiance - since added as drug intolerance.   Now off pressors, coox reassuring, and afternoon ABG 7.4/ 31/ 103/ 19.2   - stop bicarb gtt, change over to D5W at 100 ml/hr.   - cont insulin  gtt per endotool - BMET and repeat BHA redrawn per lab request/ erroneous values.  Will have elink f/u for any fluid adjustments     Lyle Pesa, NP  Pulmonary & Critical Care 07/27/2024, 6:15 PM  See Amion for pager If no response to pager , please call [phone] until 7pm After 7:00 pm call Elink  [phone]

## 2024-07-27 NOTE — Progress Notes (Signed)
 Pt tried on HHFNC per NP request.  Pt did not tolerate well due to increased WOB. Pt placed back on CPAP after ABG.

## 2024-07-27 NOTE — Progress Notes (Addendum)
 NAME:  Jorge Calhoun, MRN:  [id number], DOB:  [birth year], LOS: 7 ADMISSION DATE:  07/20/2024, CONSULTATION DATE:  07/20/2024 REFERRING MD:  EDP, CHIEF COMPLAINT:  AMS   History of Present Illness:  58 year old man who presented to [facility] 12/4 after being found down unresponsive at home. PMHx significant for HTN, PSVT, T1DM, hyperthyroidism, chronic back pain.  Patient presented to the [facility] 12/3 after 3-4 days of feeling generally unwell, poor PO intake, n/v/d. Per spouse, was given something for an infection.  On day of admission (12/4), found unresponsive at home. Brought to ED via EMS; on EMS arrival patient was hypotensive with SBP 80s, SpO2 100% on 2LNC, CBG > 400. Workup in ER included negative head imaging, labs demonstrating profound hyperglycemia, DKA. Fluid resuscitation was completed, bicarb administered and patient was started on insulin  gtt.  PCCM consulted for admission, given degree of AMS and hyperglycemia.   +Rhinovirus/enterovirus. Intubated for airway protection in the setting of AMS.  Pertinent Medical History:   Past Medical History:  Diagnosis Date   Hypertension    Nontoxic multinodular goiter    Obesity, unspecified    Plantar fasciitis    PSVT (paroxysmal supraventricular tachycardia)    noted on heart monitor 02/2020   Type I (juvenile type) diabetes mellitus without mention of complication, not stated as uncontrolled    Significant Hospital Events: Including procedures, antibiotic start and stop dates in addition to other pertinent events   12/4 Admit in DKA, RVP panel positive for rhinovirus/enterovirus 12/5 Progressive decline in mentation with eventual development of unresponsiveness intubated for airway protection with central line and A-line placed as well 12/9, extubated, aline out 12/10 was doing well pending tx out of ICU> acute SOB when OOB> massive bilateral PE with RV dysfunction/ shock> IR thrombectomy, heparin , NE  Interim History/Subjective:   S/p IR thrombectomy last evening Remains on NE 5 Slight occasional oozing from right femoral site Transitioned to salter HFNC but due to WOB/ tachypnea, placed back on CPAP Denies CP or acute SOB  Objective:   Blood pressure 125/68, pulse (!) 109, temperature 100 F (37.8 C), temperature source Axillary, resp. rate (!) 22, height 5' 11 (1.803 m), weight 91.4 kg, SpO2 100%. PAP: (33-55)/(18-25) 33/18  FiO2 (%):  [80 %] 80 %   Intake/Output Summary (Last 24 hours) at 07/27/2024 0748 Last data filed at 07/27/2024 0708 Gross per 24 hour  Intake 691.2 ml  Output 3450 ml  Net -2758.8 ml   Filed Weights   07/25/24 0500 07/26/24 0455 07/27/24 0423  Weight: 97.8 kg 98.2 kg 91.4 kg   Physical Examination: General:  ill appearing adult male lying in bed in NAD on CPAP HEENT: mask in place, pupils 3/r, no JVD Neuro: resting, easily arouses to verbal, oriented x 3, MAE-generalized weakness CV: rr, ST ~102, no murmur, +1 pulses, right groin site with some ecchymosis and stained dressing- currently saturated with leaking male purwick PULM:  mild WOB/ tachypnea 24 on CPAP 5 with TV 1.1- 1.2L, on 50% at 100%, lungs clear, slightly diminished right base GI: soft, bs+, NT, purwick - leaking as disconnected to wall suction, decom meter to R abd Extremities: warm/dry, trace generalized edema, non pitting, no calf erythema/ tenderness Skin: no rashes   Tmax 99.4 Labs Na 149> 150, K 4.6, sCr 0.90> 1.28, bicarb 9, bun 32, glucose 238, AG 32, mag 2.6, WBC 8.6> 13.6, H/H 12.5/ 39, plts 144> 196  UOP 3.4 L/ 24hrs plus unmeasured just now Last BM 12/10  Wt 97.8> 98.2> ?91.4  Patient Lines/Drains/Airways Status     Active Line/Drains/Airways     Name Placement date Placement time Site Days   Arterial Line 07/26/24 Left Radial 07/26/24  1901  Radial  1   Peripheral IV 07/25/24 20 G 1.75 Left;Posterior Forearm 07/25/24  1353  Forearm  2   Peripheral IV 07/26/24 20 G 2.5 Anterior;Left;Upper Arm  07/26/24  1901   Arm  1           Resolved Problem List:  Septic shock, resolved Respiratory insufficiency 2/2 encephalopathy r/t DKA  Assessment and Plan:   Acute hypoxic respiratory failure Massive bilateral pulmonary embolism with cor pulmonale s/p IR thrombectomy 12/10 Cardiogenic/ obstructive shock 2/2 PE - extubated 11/9 - PESI score 109, BOVA score at least 3, BNP 605, trop T hs 147> 169, pre procedure lactic 1.4 - PA pressures pre 55/25 (36) and post 33/18 (25) in IR  P:  - appreciate IR assistance - cont heparin  gtt per pharmacy - cont peripheral NE for MAP goal > 65 - repeat lactic, check LFTs, and check ABG - PICC ordered > check coox and CVP after placement - repeat Echo and LE dopplers pending - cont CPAP as needed, will try HHFNC as getting > 1L volumes on CPAP 5   AKI- in setting of hypotension, IV contrast, diuretics  AGMA Hypernatremia P:  - s/p lasix  12/10.  good UOP despite bump in sCr - considering bicarb gtt pending ABG - repeat BMET in 4hrs - cont purwick/ strict I/Os - overall I/Os not accurate with unmeasured occurrences.  Recheck today's wt - trend renal indices - support hemodynamics, renal dose meds   MSSA pneumonia Rhinovirus infection Sinusitis 1 out of 4 bottles +strep viridans. - echo 12/7 EF 70-75%, hyperdynamic, no RWMA, normal RV, no obvious vegetations P:  - day 8 of 10 of ctx - repeat BC 12/6> ngtd  - trend WBC/ fever curve - droplet precautions   DKA, resolved Type 1 diabetes - A1c 8.8 on 12/5 P: - restart insulin  gtt given AGMA 32 and low bicarb concerning for recurrent DKA.  Check BHA and BMET  - DM coordinator consult pending  Hypertension HLD - tele monitoring - hold toprol  xl 50mg , cardizem  CD 360mg , ASA in setting of shock - hold statin- checking LFTs with RV dysfunction  Thrombocytopenia, improved - initially felt related to sepsis, but given PE, could also be consumptive with PE, r/o DVT - plts cont to  improve, trend on CBC while on heparin     Hyperthyroidism - TSH 0.167 on 12/4, FT4 0.66 - will need f/u with endocrinologist   Critical care time:  38 mins     Lyle Pesa, NP [place] [facility] 07/27/2024, 7:48 AM  See Amion for pager If no response to pager , please call [phone] until 7pm After 7:00 pm call Elink  [phone]

## 2024-07-27 NOTE — Progress Notes (Signed)
 Inpatient Rehab Admissions Coordinator:   Consult received and chart reviewed.  Note new PE discovered yesterday, now s/p IR thrombectomy.  Still with SOB and increased WOB, on CPAP.  Levophed .  Will f/u tomorrow vs Monday.  Reche Lowers, PT, DPT Admissions Coordinator [phone] 07/27/2024 2:50 PM

## 2024-07-27 NOTE — Progress Notes (Signed)
 Nutrition Follow-up  DOCUMENTATION CODES:   Obesity unspecified  INTERVENTION:  - If unable to advance patient's diet or diet advanced and patient eating very poorly, would recommend Cortrak placement and initiation of tube feeds.   - If Cortrak placed and plan to start tube feeds, recommend: Osmolite 1.5 at 55 ml/h (1320 ml per day) *Would recommend starting at 54mL/hr and advancing by 10mL Q8H Prosource TF20 60 ml BID Provides 2140 kcal, 123 gm protein, 1006 ml free water  daily  - Monitor magnesium, potassium, and phosphorus daily for at least 3 days, MD to replete as needed, as pt is at risk for refeeding syndrome.  NUTRITION DIAGNOSIS:   Inadequate oral intake related to inability to eat as evidenced by NPO status. *ongoing  GOAL:   Patient will meet greater than or equal to 90% of their needs *not met  MONITOR:   Vent status, Labs, Weight trends, TF tolerance  REASON FOR ASSESSMENT:   Consult Enteral/tube feeding initiation and management (Trickle TF)  ASSESSMENT:   58 year old male with PMH type 1 DM who presented with 3-4 days of feeling generally unwell, poor PO, N/V. Admit for rhinovirus and DKA.  12/4 Admit 12/5 Intubated 12/7 Trickle TF initiated 12/9 Extubated; CLD 12/10 Soft diet -> Carb Modified diet 12/11 NPO; on CPAP  No oral intake documented while patient was on a diet th past 2 days. Per review of ordering system, patient only ordered very small meals yesterday for breakfast and lunch.   This AM, concern patient back in DKA. He is now NPO and requiring CPAP. Patient getting PICC placed at time of visit so unable to visit.  Discussed with CCM. Patient very behind on nutritional needs as he was only on trickles while on the ventilator and ate very little while on a diet. If unable to come off CPAP and/or eats very poorly recommend Cortrak and initiating tube feeds. Will provide TF recs.   Admit weight: 229# Current weight: 201# I&O's: +11.4L  since admit *Suspect patient may have lost weight since admission. Will need to monitor   Medications reviewed and include: Colace, Miralax , D5 @ 50mL/hr (provides 204 kcals over 24 hours) Levophed  @ 8 mcg/min   Labs reviewed:  Na 148 Creatinine 1.42 HA1C 8.8 (as of 12/5) Blood Glucose 118-236 x24 hours  Diet Order:   Diet Order             Diet NPO time specified  Diet effective now                   EDUCATION NEEDS:  No education needs have been identified at this time  Skin:  Skin Assessment: Reviewed RN Assessment  Last BM:  12/11  Height:  Ht Readings from Last 1 Encounters:  07/21/24 5' 11 (1.803 m)   Weight:  Wt Readings from Last 1 Encounters:  07/27/24 91.4 kg   Ideal Body Weight:  78.18 kg  BMI:  Body mass index is 28.1 kg/m.  Estimated Nutritional Needs:  Kcal:  1950-2200 kcals Protein:  105-125 grams Fluid:  >/= 2L    Trude Ned RD, LDN Contact via Secure Chat.

## 2024-07-27 NOTE — Progress Notes (Signed)
 PT Cancellation Note  Patient Details Name: Jorge Calhoun MRN: [id number] DOB: 06-09-1966   Cancelled Treatment:    Reason Eval/Treat Not Completed: Medical issues which prohibited therapy New bilateral PE, Heparin  initiated. Will follow when medically stable.  [name] PT Acute Rehabilitation Services Office [phone]   [name] 07/27/2024, 7:44 AM

## 2024-07-27 NOTE — Progress Notes (Addendum)
 PHARMACY - ANTICOAGULATION CONSULT NOTE  Pharmacy Consult for Heparin  Indication: Massive BL PE with RHS  Allergies  Allergen Reactions   Jardiance  [Empagliflozin ] Other (See Comments)    Precipitated DKA   Metformin And Related Nausea Only    Patient Measurements: Height: 5' 11 (180.3 cm) Weight: 91.4 kg (201 lb 8 oz) IBW/kg (Calculated) : 75.3 HEPARIN  DW (KG): 98.5 Corrected heparin  dosing weight: 95 kg  Vital Signs: Temp: 98.1 F (36.7 C) (12/11 1449) Temp Source: Axillary (12/11 1449) BP: 118/72 (12/11 1145) Pulse Rate: 104 (12/11 1145)  Labs: Recent Labs    07/25/24 0500 07/25/24 1736 07/26/24 1558 07/26/24 1704 07/26/24 1902 07/27/24 0449 07/27/24 0751 07/27/24 0807 07/27/24 1136 07/27/24 1430  HGB 12.3*  --  13.6 13.6  --  12.5*  --  12.2*  --   --   HCT 37.0*  --  41.9 40.0  --  39.9  --  36.0*  --   --   PLT 75*  --  144*  --   --  196  --   --   --   --   LABPROT  --   --   --   --  16.1*  --   --   --   --   --   INR  --   --   --   --  1.2  --   --   --   --   --   HEPARINUNFRC  --   --   --   --   --  >1.10*  --   --   --  0.94*  CREATININE 0.96   < >  --   --   --  1.28* 1.42*  --  1.45*  --    < > = values in this interval not displayed.    Estimated Creatinine Clearance: 63.4 mL/min (A) (by C-G formula based on SCr of 1.45 mg/dL (H)).   Medical History: Past Medical History:  Diagnosis Date   Hypertension    Nontoxic multinodular goiter    Obesity, unspecified    Plantar fasciitis    PSVT (paroxysmal supraventricular tachycardia)    noted on heart monitor 02/2020   Type I (juvenile type) diabetes mellitus without mention of complication, not stated as uncontrolled     Assessment: AC/Heme: SCDs/ASA only since 12/7 d/t low Plt 12/10: Acute deterioration with CTA positive for large acute bilateral PE with RHS  to start IV heparin . Noted bleeding from L groin where previous CVL was removed. Plts up to 144   Today, 07/27/2024 14:30  Heparin  level = 0.94, remains supra-therapeutic at reduced rate of 1400 units/hr Hgb 12.5, plts 196 Per RN no bleeding or other heparin  related issues   Goal of Therapy:  Heparin  level 0.3-0.7 units/ml Monitor platelets by anticoagulation protocol: Yes   Plan:  Decrease heparin  infusion to 1200 units/hr Check heparin  level in 6 hrs  Monitor daily heparin  level, CBC, signs/symptoms of bleeding    Thank you for allowing pharmacy to be a part of this patients care.  Eleanor EMERSON Agent, PharmD, BCPS Clinical Pharmacist [place] 07/27/2024 3:03 PM

## 2024-07-27 NOTE — Progress Notes (Signed)
°   07/27/24 2049  BiPAP/CPAP/SIPAP  BiPAP/CPAP/SIPAP Pt Type Adult  Reason BIPAP/CPAP not in use Other(comment) (On standby- pt doing well on HHFNC 30L 40%)  FiO2 (%) 40 %  BiPAP/CPAP /SiPAP Vitals  Pulse Rate (!) 105  Resp 18  SpO2 97 %  Bilateral Breath Sounds Clear;Diminished  MEWS Score/Color  MEWS Score 1  MEWS Score Color Green

## 2024-07-27 NOTE — Progress Notes (Signed)
 Referring Physician(s): Fahid, Algihanim, MD  Supervising Physician: Philip Cornet  Patient Status:  [facility] - In-pt  Chief Complaint: Bilateral PE s/p thrombectomy in IR overnight 07/26/24 (Dr. Jennefer)  Subjective:  Patient sleeping upon arrival to room, cousin at bedside who states he has cared for the patient since he was young - she is wondering about him going to rehab after his hospital stay and if that is something covered by the [place]. She states he seems to be doing much better than yesterday, no complaints of chest pain that she is aware but still having some trouble taking deep breaths.   Jorge Calhoun is somnolent but arouses easily with tactile cues, currently on high flow O2, he shakes his head no when asked if he has chest pain. Per RN slow oozing from groin site, more serosanguineous appearing as opposed to frank blood. Last dressing change ~0500 today.   Allergies: Metformin and related  Medications: Prior to Admission medications  Medication Sig Start Date End Date Taking? Authorizing Provider  amoxicillin-clavulanate (AUGMENTIN) 875-125 MG tablet Take 1 tablet by mouth 2 (two) times daily. 07/19/24  Yes [provider]  aspirin  81 MG tablet Take 81 mg by mouth daily.   Yes [provider]  atorvastatin  (LIPITOR) 40 MG tablet Take 1 tablet (40 mg total) by mouth daily. 03/29/24  Yes Dunn, Dayna N, PA-C  benzonatate (TESSALON) 100 MG capsule Take 200 mg by mouth 3 (three) times daily as needed for cough. 07/19/24  Yes [provider]  chlorpheniramine (CHLOR-TRIMETON) 4 MG tablet Take 4 mg by mouth every 4 (four) hours as needed (Drainage). 07/19/24  Yes [provider]  chlorthalidone  (HYGROTON ) 25 MG tablet Take 1 tablet (25 mg total) by mouth daily. 06/06/24  Yes Turner, Wilbert SAUNDERS, MD  diltiazem  (CARDIZEM  CD) 360 MG 24 hr capsule Take 1 capsule (360 mg total) by mouth daily. Patient taking differently: Take 300 mg by mouth daily. 10/12/23  Yes  Turner, Wilbert SAUNDERS, MD  empagliflozin  (JARDIANCE ) 25 MG TABS tablet Take 12.5 mg by mouth daily. 11/26/22  Yes [provider]  guaiFENesin (MUCINEX) 600 MG 12 hr tablet Take 600 mg by mouth 2 (two) times daily as needed (Sinus/Chest Congestion). 07/19/24  Yes [provider]  insulin  lispro (HUMALOG) 100 UNIT/ML injection Inject 5 Units into the skin daily.   Yes [provider]  metoprolol  succinate (TOPROL  XL) 50 MG 24 hr tablet Take 1 tablet (50 mg total) by mouth daily. Take with or immediately following a meal. 04/18/24  Yes Turner, Wilbert SAUNDERS, MD  Multiple Vitamin (MULTIVITAMIN) capsule Take 1 capsule by mouth daily.   Yes [provider]  ondansetron  (ZOFRAN -ODT) 4 MG disintegrating tablet Take 4 mg by mouth 3 (three) times daily as needed for nausea or vomiting. 07/19/24  Yes [provider]  potassium chloride  SA (KLOR-CON  M20) 20 MEQ tablet Take 2 tablets (40 mEq total) by mouth daily. 01/19/23  Yes Dunn, Dayna N, PA-C  TRESIBA FLEXTOUCH 100 UNIT/ML FlexTouch Pen Inject 24 Units into the skin daily. 01/07/20  Yes [provider]  meloxicam (MOBIC) 15 MG tablet Take 15 mg by mouth as needed for pain. Patient not taking: Reported on 07/21/2024 11/25/22   [provider]     Vital Signs: BP 125/68   Pulse (!) 105   Temp 100 F (37.8 C) (Axillary)   Resp (!) 28   Ht 5' 11 (1.803 m)   Wt 201 lb 8 oz (91.4  kg)   SpO2 100%   BMI 28.10 kg/m   Physical Exam Vitals reviewed.  Constitutional:      Comments: Somnolent  Cardiovascular:     Rate and Rhythm: Tachycardia present.     Comments: (+) Right CFV dressing saturated with serosanguineous fluid, dressing was removed and site observed for several minutes without bleeding or drainage. Soft, non-tender. Pulmonary:     Comments: (+) CPAP Abdominal:     Palpations: Abdomen is soft.  Skin:    General: Skin is warm and dry.     Imaging: IR THROMBECT PRIM MECH INIT (INCLU) MOD  SED Result Date: 07/27/2024 INDICATION: [age]-year-old male with history of acute, high risk pulmonary embolism. EXAM: 1. Ultrasound-guided vascular access of the right common femoral vein. 2. Bilateral pulmonary angiography. 3. Pulmonary manometry. 4. Aspiration thrombectomy of the bilateral pulmonary arteries. COMPARISON:  CTA chest from earlier the same day MEDICATIONS: 9,000 units heparin , intravenous ANESTHESIA/SEDATION: Moderate (conscious) sedation was employed during this procedure. A total of Versed  2 mg and Fentanyl  100 mcg was administered intravenously. Moderate Sedation Time: 59 minutes. The patient's level of consciousness and vital signs were monitored continuously by radiology nursing throughout the procedure under my direct supervision. FLUOROSCOPY TIME:  Eighty-three mGy reference air kerma COMPLICATIONS: None immediate. TECHNIQUE: Informed written consent was obtained from the patient after a thorough discussion of the procedural risks, benefits and alternatives. All questions were addressed. Maximal Sterile Barrier Technique was utilized including caps, mask, sterile gowns, sterile gloves, sterile drape, hand hygiene and skin antiseptic. A timeout was performed prior to the initiation of the procedure. Preprocedure ultrasound evaluation demonstrated patency of the right common femoral vein. The procedure was planned. Subdermal Local anesthesia was administered 1% lidocaine . A small skin nick was made. Under direct ultrasound visualization, the right common femoral vein was accessed with a 21 gauge micropuncture needle. A permanent ultrasound image was captured and stored in the record. Micropuncture sheath was inserted followed by placement of a Wholey wire was directed to the inferior vena cava under fluoroscopic guidance. Serial dilation was performed with an 8 French vascular sheath followed by placement of 2 ProGlides in a pre close fashion at the 10 o'clock and 2 o'clock positions.  Over the wire, a 24 French Inari sheath was then placed and directed to the inferior vena cava. Over the wire, a double angle pigtail catheter was inserted and under fluoroscopic guidance directed through the right atrium and right ventricle to the main pulmonary artery. The [facility] wire was then advanced to the right inferior lobar pulmonary artery. The pigtail catheter was exchanged for a 5 French vert catheter which was directed into the right inferior lobar pulmonary artery. The wire was exchanged for a short taper superstiff Amplatz wire. The catheter was removed. A 24 French Flowtreiver aspiration catheter was then directed under fluoroscopic guidance to the main pulmonary artery. The inner dilator was removed. Pulmonary manometry was performed which was significant for a mean pulmonary artery pressure of 36 mm Hg. The aspiration catheter was then advanced to the right pulmonary artery. Multiple aspirations were performed which yielded a large volume of chronic appearing thrombus. Right pulmonary angiogram was then repeated which demonstrated significantly improved patency and perfusion of the right lung. The system was then directed to the left pulmonary artery in the wire was inserted into the left inferior lobar pulmonary artery. Aspiration thrombectomy was then performed which yielded large volume chronic appearing thrombus. Repeat pulmonary manometry was then performed which demonstrated reduction in mean  pulmonary artery pressure to 25 mm Hg. At this point, the patient's tachycardia improved and oxygen saturations improved. Left pulmonary angiogram demonstrated significantly improved perfusion throughout the left lung. The catheters were removed. The sheath was then removed and the ProGlides were tied and cut. IMPRESSION: 1. Successful bilateral pulmonary artery thrombectomy with removal of large volume chronic appearing thrombus. 2. Pulmonary manometry demonstrated reduction in mean pulmonary artery  pressure from 36 mm Hg to 25 mm Hg. Ester Sides, MD [facility] Radiology Electronically Signed   By: Ester Sides M.D.   On: 07/27/2024 08:19   IR Angiogram Pulmonary Bilateral Selective Result Date: 07/27/2024 INDICATION: [age]-year-old male with history of acute, high risk pulmonary embolism. EXAM: 1. Ultrasound-guided vascular access of the right common femoral vein. 2. Bilateral pulmonary angiography. 3. Pulmonary manometry. 4. Aspiration thrombectomy of the bilateral pulmonary arteries. COMPARISON:  CTA chest from earlier the same day MEDICATIONS: 9,000 units heparin , intravenous ANESTHESIA/SEDATION: Moderate (conscious) sedation was employed during this procedure. A total of Versed  2 mg and Fentanyl  100 mcg was administered intravenously. Moderate Sedation Time: 59 minutes. The patient's level of consciousness and vital signs were monitored continuously by radiology nursing throughout the procedure under my direct supervision. FLUOROSCOPY TIME:  Eighty-three mGy reference air kerma COMPLICATIONS: None immediate. TECHNIQUE: Informed written consent was obtained from the patient after a thorough discussion of the procedural risks, benefits and alternatives. All questions were addressed. Maximal Sterile Barrier Technique was utilized including caps, mask, sterile gowns, sterile gloves, sterile drape, hand hygiene and skin antiseptic. A timeout was performed prior to the initiation of the procedure. Preprocedure ultrasound evaluation demonstrated patency of the right common femoral vein. The procedure was planned. Subdermal Local anesthesia was administered 1% lidocaine . A small skin nick was made. Under direct ultrasound visualization, the right common femoral vein was accessed with a 21 gauge micropuncture needle. A permanent ultrasound image was captured and stored in the record. Micropuncture sheath was inserted followed by placement of a Wholey wire  was directed to the inferior vena cava under fluoroscopic guidance. Serial dilation was performed with an 8 French vascular sheath followed by placement of 2 ProGlides in a pre close fashion at the 10 o'clock and 2 o'clock positions. Over the wire, a 24 French Inari sheath was then placed and directed to the inferior vena cava. Over the wire, a double angle pigtail catheter was inserted and under fluoroscopic guidance directed through the right atrium and right ventricle to the main pulmonary artery. The [facility] wire was then advanced to the right inferior lobar pulmonary artery. The pigtail catheter was exchanged for a 5 French vert catheter which was directed into the right inferior lobar pulmonary artery. The wire was exchanged for a short taper superstiff Amplatz wire. The catheter was removed. A 24 French Flowtreiver aspiration catheter was then directed under fluoroscopic guidance to the main pulmonary artery. The inner dilator was removed. Pulmonary manometry was performed which was significant for a mean pulmonary artery pressure of 36 mm Hg. The aspiration catheter was then advanced to the right pulmonary artery. Multiple aspirations were performed which yielded a large volume of chronic appearing thrombus. Right pulmonary angiogram was then repeated which demonstrated significantly improved patency and perfusion of the right lung. The system was then directed to the left pulmonary artery in the wire was inserted into the left inferior lobar pulmonary artery. Aspiration thrombectomy was then performed which yielded large volume chronic appearing thrombus. Repeat pulmonary manometry was then performed which  demonstrated reduction in mean pulmonary artery pressure to 25 mm Hg. At this point, the patient's tachycardia improved and oxygen saturations improved. Left pulmonary angiogram demonstrated significantly improved perfusion throughout the left lung. The catheters were removed. The sheath was then removed  and the ProGlides were tied and cut. IMPRESSION: 1. Successful bilateral pulmonary artery thrombectomy with removal of large volume chronic appearing thrombus. 2. Pulmonary manometry demonstrated reduction in mean pulmonary artery pressure from 36 mm Hg to 25 mm Hg. Ester Sides, MD [facility] Radiology Electronically Signed   By: Ester Sides M.D.   On: 07/27/2024 08:19   IR Angiogram Selective Each Additional Vessel Result Date: 07/27/2024 INDICATION: [age]-year-old male with history of acute, high risk pulmonary embolism. EXAM: 1. Ultrasound-guided vascular access of the right common femoral vein. 2. Bilateral pulmonary angiography. 3. Pulmonary manometry. 4. Aspiration thrombectomy of the bilateral pulmonary arteries. COMPARISON:  CTA chest from earlier the same day MEDICATIONS: 9,000 units heparin , intravenous ANESTHESIA/SEDATION: Moderate (conscious) sedation was employed during this procedure. A total of Versed  2 mg and Fentanyl  100 mcg was administered intravenously. Moderate Sedation Time: 59 minutes. The patient's level of consciousness and vital signs were monitored continuously by radiology nursing throughout the procedure under my direct supervision. FLUOROSCOPY TIME:  Eighty-three mGy reference air kerma COMPLICATIONS: None immediate. TECHNIQUE: Informed written consent was obtained from the patient after a thorough discussion of the procedural risks, benefits and alternatives. All questions were addressed. Maximal Sterile Barrier Technique was utilized including caps, mask, sterile gowns, sterile gloves, sterile drape, hand hygiene and skin antiseptic. A timeout was performed prior to the initiation of the procedure. Preprocedure ultrasound evaluation demonstrated patency of the right common femoral vein. The procedure was planned. Subdermal Local anesthesia was administered 1% lidocaine . A small skin nick was made. Under direct ultrasound  visualization, the right common femoral vein was accessed with a 21 gauge micropuncture needle. A permanent ultrasound image was captured and stored in the record. Micropuncture sheath was inserted followed by placement of a Wholey wire was directed to the inferior vena cava under fluoroscopic guidance. Serial dilation was performed with an 8 French vascular sheath followed by placement of 2 ProGlides in a pre close fashion at the 10 o'clock and 2 o'clock positions. Over the wire, a 24 French Inari sheath was then placed and directed to the inferior vena cava. Over the wire, a double angle pigtail catheter was inserted and under fluoroscopic guidance directed through the right atrium and right ventricle to the main pulmonary artery. The [facility] wire was then advanced to the right inferior lobar pulmonary artery. The pigtail catheter was exchanged for a 5 French vert catheter which was directed into the right inferior lobar pulmonary artery. The wire was exchanged for a short taper superstiff Amplatz wire. The catheter was removed. A 24 French Flowtreiver aspiration catheter was then directed under fluoroscopic guidance to the main pulmonary artery. The inner dilator was removed. Pulmonary manometry was performed which was significant for a mean pulmonary artery pressure of 36 mm Hg. The aspiration catheter was then advanced to the right pulmonary artery. Multiple aspirations were performed which yielded a large volume of chronic appearing thrombus. Right pulmonary angiogram was then repeated which demonstrated significantly improved patency and perfusion of the right lung. The system was then directed to the left pulmonary artery in the wire was inserted into the left inferior lobar pulmonary artery. Aspiration thrombectomy was then performed which yielded large volume chronic appearing thrombus. Repeat pulmonary  manometry was then performed which demonstrated reduction in mean pulmonary artery pressure to 25 mm Hg.  At this point, the patient's tachycardia improved and oxygen saturations improved. Left pulmonary angiogram demonstrated significantly improved perfusion throughout the left lung. The catheters were removed. The sheath was then removed and the ProGlides were tied and cut. IMPRESSION: 1. Successful bilateral pulmonary artery thrombectomy with removal of large volume chronic appearing thrombus. 2. Pulmonary manometry demonstrated reduction in mean pulmonary artery pressure from 36 mm Hg to 25 mm Hg. Ester Sides, MD [facility] Radiology Electronically Signed   By: Ester Sides M.D.   On: 07/27/2024 08:19   IR Angiogram Selective Each Additional Vessel Result Date: 07/27/2024 INDICATION: [age]-year-old male with history of acute, high risk pulmonary embolism. EXAM: 1. Ultrasound-guided vascular access of the right common femoral vein. 2. Bilateral pulmonary angiography. 3. Pulmonary manometry. 4. Aspiration thrombectomy of the bilateral pulmonary arteries. COMPARISON:  CTA chest from earlier the same day MEDICATIONS: 9,000 units heparin , intravenous ANESTHESIA/SEDATION: Moderate (conscious) sedation was employed during this procedure. A total of Versed  2 mg and Fentanyl  100 mcg was administered intravenously. Moderate Sedation Time: 59 minutes. The patient's level of consciousness and vital signs were monitored continuously by radiology nursing throughout the procedure under my direct supervision. FLUOROSCOPY TIME:  Eighty-three mGy reference air kerma COMPLICATIONS: None immediate. TECHNIQUE: Informed written consent was obtained from the patient after a thorough discussion of the procedural risks, benefits and alternatives. All questions were addressed. Maximal Sterile Barrier Technique was utilized including caps, mask, sterile gowns, sterile gloves, sterile drape, hand hygiene and skin antiseptic. A timeout was performed prior to the initiation of the  procedure. Preprocedure ultrasound evaluation demonstrated patency of the right common femoral vein. The procedure was planned. Subdermal Local anesthesia was administered 1% lidocaine . A small skin nick was made. Under direct ultrasound visualization, the right common femoral vein was accessed with a 21 gauge micropuncture needle. A permanent ultrasound image was captured and stored in the record. Micropuncture sheath was inserted followed by placement of a Wholey wire was directed to the inferior vena cava under fluoroscopic guidance. Serial dilation was performed with an 8 French vascular sheath followed by placement of 2 ProGlides in a pre close fashion at the 10 o'clock and 2 o'clock positions. Over the wire, a 24 French Inari sheath was then placed and directed to the inferior vena cava. Over the wire, a double angle pigtail catheter was inserted and under fluoroscopic guidance directed through the right atrium and right ventricle to the main pulmonary artery. The [facility] wire was then advanced to the right inferior lobar pulmonary artery. The pigtail catheter was exchanged for a 5 French vert catheter which was directed into the right inferior lobar pulmonary artery. The wire was exchanged for a short taper superstiff Amplatz wire. The catheter was removed. A 24 French Flowtreiver aspiration catheter was then directed under fluoroscopic guidance to the main pulmonary artery. The inner dilator was removed. Pulmonary manometry was performed which was significant for a mean pulmonary artery pressure of 36 mm Hg. The aspiration catheter was then advanced to the right pulmonary artery. Multiple aspirations were performed which yielded a large volume of chronic appearing thrombus. Right pulmonary angiogram was then repeated which demonstrated significantly improved patency and perfusion of the right lung. The system was then directed to the left pulmonary artery in the wire was inserted into the left inferior lobar  pulmonary artery. Aspiration thrombectomy was then performed which yielded large  volume chronic appearing thrombus. Repeat pulmonary manometry was then performed which demonstrated reduction in mean pulmonary artery pressure to 25 mm Hg. At this point, the patient's tachycardia improved and oxygen saturations improved. Left pulmonary angiogram demonstrated significantly improved perfusion throughout the left lung. The catheters were removed. The sheath was then removed and the ProGlides were tied and cut. IMPRESSION: 1. Successful bilateral pulmonary artery thrombectomy with removal of large volume chronic appearing thrombus. 2. Pulmonary manometry demonstrated reduction in mean pulmonary artery pressure from 36 mm Hg to 25 mm Hg. Ester Sides, MD [facility] Radiology Electronically Signed   By: Ester Sides M.D.   On: 07/27/2024 08:19   IR US  Guide Vasc Access Right Result Date: 07/27/2024 INDICATION: [age]-year-old male with history of acute, high risk pulmonary embolism. EXAM: 1. Ultrasound-guided vascular access of the right common femoral vein. 2. Bilateral pulmonary angiography. 3. Pulmonary manometry. 4. Aspiration thrombectomy of the bilateral pulmonary arteries. COMPARISON:  CTA chest from earlier the same day MEDICATIONS: 9,000 units heparin , intravenous ANESTHESIA/SEDATION: Moderate (conscious) sedation was employed during this procedure. A total of Versed  2 mg and Fentanyl  100 mcg was administered intravenously. Moderate Sedation Time: 59 minutes. The patient's level of consciousness and vital signs were monitored continuously by radiology nursing throughout the procedure under my direct supervision. FLUOROSCOPY TIME:  Eighty-three mGy reference air kerma COMPLICATIONS: None immediate. TECHNIQUE: Informed written consent was obtained from the patient after a thorough discussion of the procedural risks, benefits and alternatives. All questions  were addressed. Maximal Sterile Barrier Technique was utilized including caps, mask, sterile gowns, sterile gloves, sterile drape, hand hygiene and skin antiseptic. A timeout was performed prior to the initiation of the procedure. Preprocedure ultrasound evaluation demonstrated patency of the right common femoral vein. The procedure was planned. Subdermal Local anesthesia was administered 1% lidocaine . A small skin nick was made. Under direct ultrasound visualization, the right common femoral vein was accessed with a 21 gauge micropuncture needle. A permanent ultrasound image was captured and stored in the record. Micropuncture sheath was inserted followed by placement of a Wholey wire was directed to the inferior vena cava under fluoroscopic guidance. Serial dilation was performed with an 8 French vascular sheath followed by placement of 2 ProGlides in a pre close fashion at the 10 o'clock and 2 o'clock positions. Over the wire, a 24 French Inari sheath was then placed and directed to the inferior vena cava. Over the wire, a double angle pigtail catheter was inserted and under fluoroscopic guidance directed through the right atrium and right ventricle to the main pulmonary artery. The [facility] wire was then advanced to the right inferior lobar pulmonary artery. The pigtail catheter was exchanged for a 5 French vert catheter which was directed into the right inferior lobar pulmonary artery. The wire was exchanged for a short taper superstiff Amplatz wire. The catheter was removed. A 24 French Flowtreiver aspiration catheter was then directed under fluoroscopic guidance to the main pulmonary artery. The inner dilator was removed. Pulmonary manometry was performed which was significant for a mean pulmonary artery pressure of 36 mm Hg. The aspiration catheter was then advanced to the right pulmonary artery. Multiple aspirations were performed which yielded a large volume of chronic appearing thrombus. Right pulmonary  angiogram was then repeated which demonstrated significantly improved patency and perfusion of the right lung. The system was then directed to the left pulmonary artery in the wire was inserted into the left inferior lobar pulmonary artery. Aspiration thrombectomy was  then performed which yielded large volume chronic appearing thrombus. Repeat pulmonary manometry was then performed which demonstrated reduction in mean pulmonary artery pressure to 25 mm Hg. At this point, the patient's tachycardia improved and oxygen saturations improved. Left pulmonary angiogram demonstrated significantly improved perfusion throughout the left lung. The catheters were removed. The sheath was then removed and the ProGlides were tied and cut. IMPRESSION: 1. Successful bilateral pulmonary artery thrombectomy with removal of large volume chronic appearing thrombus. 2. Pulmonary manometry demonstrated reduction in mean pulmonary artery pressure from 36 mm Hg to 25 mm Hg. Ester Sides, MD [facility] Radiology Electronically Signed   By: Ester Sides M.D.   On: 07/27/2024 08:19   US  EKG SITE RITE Result Date: 07/27/2024 If Site Rite image not attached, placement could not be confirmed due to current cardiac rhythm.  CT Angio Chest Pulmonary Embolism (PE) W or WO Contrast Result Date: 07/26/2024 EXAM: CTA of the Chest with contrast for PE 07/26/2024 05:27:37 PM TECHNIQUE: CTA of the chest was performed after the administration of 75 mL of iohexol  (OMNIPAQUE ) 350 MG/ML injection. Multiplanar reformatted images are provided for review. MIP images are provided for review. Automated exposure control, iterative reconstruction, and/or weight based adjustment of the mA/kV was utilized to reduce the radiation dose to as low as reasonably achievable. COMPARISON: None available. CLINICAL HISTORY: acute hypoxic respiratory failure FINDINGS: PULMONARY ARTERIES: Pulmonary arteries are adequately  opacified for evaluation. Large filling defects are seen involving the distal portions of both pulmonary arteries which extend into upper and lower lobe branches bilaterally consistent with large pulmonary emboli. Main pulmonary artery is normal in caliber. MEDIASTINUM: The heart demonstrates an RV/LV ratio of approximately 2.0, suggesting right heart strain. The pericardium demonstrates no acute abnormality. There is no acute abnormality of the thoracic aorta. LYMPH NODES: No mediastinal, hilar or axillary lymphadenopathy. LUNGS AND PLEURA: Left lower lobe airspace opacities are noted, concerning for pneumonia or atelectasis. Minimal right lower lobe subsegmental atelectasis is noted. Small left pleural effusion is noted. No pneumothorax. UPPER ABDOMEN: Limited images of the upper abdomen are unremarkable. SOFT TISSUES AND BONES: No acute bone or soft tissue abnormality. IMPRESSION: 1. Large bilateral pulmonary emboli involving the bilateral pulmonary arteries with lobar branch extension bilaterally, with right heart strain (RV/LV ratio approximately 2.0). Consistent with at least submassive (intermediate risk) PE. The presence of right heart strain has been associated with an increased risk of morbidity and mortality. This finding was discussed with Bobie, nurse practitioner, at 5:40 pm on 07/26/2024. 2. Left lower lobe airspace disease, which may reflect pneumonia or atelectasis. 3. Small left pleural effusion. Electronically signed by: Lynwood Seip MD 07/26/2024 05:43 PM EST RP Workstation: [id number]   DG Chest Port 1 View Result Date: 07/26/2024 CLINICAL DATA:  Hypoxia. EXAM: PORTABLE CHEST 1 VIEW COMPARISON:  Radiograph dated 07/21/2024. FINDINGS: Minimal left lung base atelectasis. Trace left pleural effusion suspected. The right lung scan. No pneumothorax. Stable cardiac silhouette. No acute osseous pathology. IMPRESSION: Trace left pleural effusion and minimal left lung base atelectasis. Electronically  Signed   By: Vanetta Chou M.D.   On: 07/26/2024 17:30   CT HEAD WO CONTRAST ( ) Result Date: 07/23/2024 EXAM: CT HEAD WITHOUT CONTRAST 07/23/2024 04:20:51 PM TECHNIQUE: CT of the head was performed without the administration of intravenous contrast. Automated exposure control, iterative reconstruction, and/or weight based adjustment of the mA/kV was utilized to reduce the radiation dose to as low as reasonably achievable. COMPARISON: 07/20/2024 CLINICAL HISTORY: Encephalopathy,  altered mental status. Was treated for DKA. He does have hypernatremia being treated. FINDINGS: BRAIN AND VENTRICLES: No acute hemorrhage. No evidence of acute infarct. No hydrocephalus. No extra-axial collection. No mass effect or midline shift. ORBITS: No acute abnormality. SINUSES: Moderate right maxillary sinus mucosal thickening. Bilateral maxillary antrostomies again noted. Secretions in nasopharynx. SOFT TISSUES AND SKULL: No acute soft tissue abnormality. No skull fracture. IMPRESSION: 1. No acute intracranial abnormality. 2. Moderate right maxillary sinus mucosal thickening with secretions in the nasopharynx. Bilateral maxillary antrostomies are again present. Electronically signed by: Lonni Necessary MD 07/23/2024 05:18 PM EST RP Workstation: [id number]    Labs:  CBC: Recent Labs    07/24/24 0400 07/25/24 0500 07/26/24 1558 07/26/24 1704 07/27/24 0449 07/27/24 0807  WBC 6.0 6.0 8.6  --  13.6*  --   HGB 11.9* 12.3* 13.6 13.6 12.5* 12.2*  HCT 33.7* 37.0* 41.9 40.0 39.9 36.0*  PLT 88* 75* 144*  --  196  --     COAGS: Recent Labs    07/26/24 1902  INR 1.2    BMP: Recent Labs    07/25/24 1736 07/25/24 2253 07/26/24 0307 07/26/24 1704 07/27/24 0449 07/27/24 0807  NA 149* 150* 149* 146* 150* 148*  K 3.6 3.6 4.0 3.1* 4.6 4.6  CL 108 107 108  --  109  --   CO2 25 25 23   --  9*  --   GLUCOSE 154* 158* 165*  --  238*  --   BUN 21* 20 22*  --  32*  --   CALCIUM  8.5* 8.5* 8.6*  --  8.1*  --    CREATININE 0.83 0.89 0.90  --  1.28*  --   GFRNONAA >60 >60 >60  --  >60  --     LIVER FUNCTION TESTS: Recent Labs    03/16/24 1521 07/20/24 2330 07/22/24 1025  BILITOT  --  2.3* 0.3  AST  --  35 53*  ALT 15 22 25   ALKPHOS  --  79 76  PROT  --  5.6* 5.0*  ALBUMIN  --  2.9* 3.0*    Assessment and Plan:  58 y/o M admitted initially for treatment of respiratory infection with sudden onset of severe dyspnea yesterday with hypoxia and hypotension. CTA showed bilateral PE and he underwent successful thrombectomy in IR last night with Dr. Jennefer.  Patient remains on CPAP, weaning down O2 requirement, remains on pressors. Heparin  gtt running. Echo today showing right ventricular systolic function to be mildly reduced and right ventricle mildly enlarged. BLE duplex performed, results pending. Troponin down trending. Per CCM concern for DKA currently so also on insulin  gtt.  Right CFV puncture site saturated with serosanguineous fluid on my exam this afternoon, per RN has been slowly oozing similar looking fluid since return to ICU - no frank bleeding noted. Last dressing change ~-0500 today. Dressing was removed and site observed for several minutes without drainage or bleeding, site is soft and non tender. Per procedure note vein sutured with proglide, no external suture.  Plan: - Routine wound care of right CFV puncture site until healed. Do not submerge x 7 days. If drainage persists please reach out to IR. - No further IR needs at this time, plans per CCM. IR remains available as needed, please call with questions or concerns.  Electronically Signed: Clotilda DELENA Hesselbach, PA-C 07/27/2024, 8:56 AM   I spent a total of 15 Minutes at the the patient's bedside AND on the patient's hospital floor or  unit, greater than 50% of which was counseling/coordinating care for pulmonary embolism.

## 2024-07-28 LAB — BASIC METABOLIC PANEL WITH GFR
Anion gap: 11 (ref 5–15)
Anion gap: 12 (ref 5–15)
Anion gap: 13 (ref 5–15)
Anion gap: 14 (ref 5–15)
Anion gap: 15 (ref 5–15)
BUN: 17 mg/dL (ref 6–20)
BUN: 19 mg/dL (ref 6–20)
BUN: 22 mg/dL — ABNORMAL HIGH (ref 6–20)
BUN: 24 mg/dL — ABNORMAL HIGH (ref 6–20)
BUN: 25 mg/dL — ABNORMAL HIGH (ref 6–20)
CO2: 20 mmol/L — ABNORMAL LOW (ref 22–32)
CO2: 22 mmol/L (ref 22–32)
CO2: 23 mmol/L (ref 22–32)
CO2: 23 mmol/L (ref 22–32)
CO2: 26 mmol/L (ref 22–32)
Calcium: 7.5 mg/dL — ABNORMAL LOW (ref 8.9–10.3)
Calcium: 7.6 mg/dL — ABNORMAL LOW (ref 8.9–10.3)
Calcium: 7.7 mg/dL — ABNORMAL LOW (ref 8.9–10.3)
Calcium: 7.7 mg/dL — ABNORMAL LOW (ref 8.9–10.3)
Calcium: 7.7 mg/dL — ABNORMAL LOW (ref 8.9–10.3)
Chloride: 115 mmol/L — ABNORMAL HIGH (ref 98–111)
Chloride: 115 mmol/L — ABNORMAL HIGH (ref 98–111)
Chloride: 117 mmol/L — ABNORMAL HIGH (ref 98–111)
Chloride: 117 mmol/L — ABNORMAL HIGH (ref 98–111)
Chloride: 118 mmol/L — ABNORMAL HIGH (ref 98–111)
Creatinine, Ser: 0.89 mg/dL (ref 0.61–1.24)
Creatinine, Ser: 0.97 mg/dL (ref 0.61–1.24)
Creatinine, Ser: 0.99 mg/dL (ref 0.61–1.24)
Creatinine, Ser: 1.04 mg/dL (ref 0.61–1.24)
Creatinine, Ser: 1.07 mg/dL (ref 0.61–1.24)
GFR, Estimated: >60 mL/min (ref >60)
GFR, Estimated: >60 mL/min (ref >60)
GFR, Estimated: >60 mL/min (ref >60)
GFR, Estimated: >60 mL/min (ref >60)
GFR, Estimated: >60 mL/min (ref >60)
Glucose, Bld: 121 mg/dL — ABNORMAL HIGH (ref 70–99)
Glucose, Bld: 146 mg/dL — ABNORMAL HIGH (ref 70–99)
Glucose, Bld: 149 mg/dL — ABNORMAL HIGH (ref 70–99)
Glucose, Bld: 150 mg/dL — ABNORMAL HIGH (ref 70–99)
Glucose, Bld: 157 mg/dL — ABNORMAL HIGH (ref 70–99)
Potassium: 3.3 mmol/L — ABNORMAL LOW (ref 3.5–5.1)
Potassium: 3.4 mmol/L — ABNORMAL LOW (ref 3.5–5.1)
Potassium: 3.4 mmol/L — ABNORMAL LOW (ref 3.5–5.1)
Potassium: 3.4 mmol/L — ABNORMAL LOW (ref 3.5–5.1)
Potassium: 3.4 mmol/L — ABNORMAL LOW (ref 3.5–5.1)
Sodium: 151 mmol/L — ABNORMAL HIGH (ref 135–145)
Sodium: 151 mmol/L — ABNORMAL HIGH (ref 135–145)
Sodium: 152 mmol/L — ABNORMAL HIGH (ref 135–145)
Sodium: 153 mmol/L — ABNORMAL HIGH (ref 135–145)
Sodium: 154 mmol/L — ABNORMAL HIGH (ref 135–145)

## 2024-07-28 LAB — GLUCOSE, CAPILLARY
Glucose-Capillary: 126 mg/dL — ABNORMAL HIGH (ref 70–99)
Glucose-Capillary: 141 mg/dL — ABNORMAL HIGH (ref 70–99)
Glucose-Capillary: 149 mg/dL — ABNORMAL HIGH (ref 70–99)
Glucose-Capillary: 127 mg/dL — ABNORMAL HIGH (ref 70–99)
Glucose-Capillary: 142 mg/dL — ABNORMAL HIGH (ref 70–99)
Glucose-Capillary: 133 mg/dL — ABNORMAL HIGH (ref 70–99)
Glucose-Capillary: 148 mg/dL — ABNORMAL HIGH (ref 70–99)
Glucose-Capillary: 135 mg/dL — ABNORMAL HIGH (ref 70–99)
Glucose-Capillary: 116 mg/dL — ABNORMAL HIGH (ref 70–99)
Glucose-Capillary: 121 mg/dL — ABNORMAL HIGH (ref 70–99)

## 2024-07-28 LAB — BETA-HYDROXYBUTYRIC ACID
Beta-Hydroxybutyric Acid: 5.03 mmol/L — ABNORMAL HIGH (ref 0.05–0.27)
Beta-Hydroxybutyric Acid: 3.41 mmol/L — ABNORMAL HIGH (ref 0.05–0.27)
Beta-Hydroxybutyric Acid: 3.13 mmol/L — ABNORMAL HIGH (ref 0.05–0.27)

## 2024-07-28 LAB — CBC
HCT: 34 % — ABNORMAL LOW (ref 39.0–52.0)
Hemoglobin: 10.9 g/dL — ABNORMAL LOW (ref 13.0–17.0)
MCH: 31.2 pg (ref 26.0–34.0)
MCHC: 32.1 g/dL (ref 30.0–36.0)
MCV: 97.4 fL (ref 80.0–100.0)
Platelets: 203 K/uL (ref 150–400)
RBC: 3.49 MIL/uL — ABNORMAL LOW (ref 4.22–5.81)
RDW: 14.8 % (ref 11.5–15.5)
WBC: 6.7 K/uL (ref 4.0–10.5)
nRBC: 0 % (ref 0.0–0.2)

## 2024-07-28 LAB — MAGNESIUM
Magnesium: 2.4 mg/dL (ref 1.7–2.4)
Magnesium: 2.5 mg/dL — ABNORMAL HIGH (ref 1.7–2.4)

## 2024-07-28 LAB — HEPARIN LEVEL (UNFRACTIONATED): Heparin Unfractionated: 0.53 [IU]/mL (ref 0.30–0.70)

## 2024-07-28 MED ORDER — METOPROLOL TARTRATE 25 MG PO TABS
25.0000 mg | ORAL_TABLET | Freq: Two times a day (BID) | ORAL | Status: DC
  Administered 2024-07-28 – 2024-08-14 (×35): 25 mg via ORAL
  Filled 2024-07-28 (×34): qty 1

## 2024-07-28 MED ORDER — CALCIUM GLUCONATE-NACL 1-0.675 GM/50ML-% IV SOLN: 1.0000 g | Freq: Once | INTRAVENOUS | Status: DC

## 2024-07-28 MED ORDER — CALCIUM GLUCONATE-NACL 1-0.675 GM/50ML-% IV SOLN
1.0000 g | Freq: Once | INTRAVENOUS | Status: AC
  Administered 2024-07-28: 1000 mg via INTRAVENOUS
  Filled 2024-07-28: qty 50

## 2024-07-28 MED ORDER — INSULIN GLARGINE-YFGN 100 UNIT/ML ~~LOC~~ SOLN
5.0000 [IU] | Freq: Two times a day (BID) | SUBCUTANEOUS | Status: DC
  Administered 2024-07-28 – 2024-07-31 (×7): 5 [IU] via SUBCUTANEOUS
  Filled 2024-07-28 (×10): qty 0.05

## 2024-07-28 MED ORDER — POTASSIUM CHLORIDE 20 MEQ PO PACK
40.0000 meq | PACK | Freq: Once | ORAL | Status: AC
  Administered 2024-07-28: 40 meq via ORAL
  Filled 2024-07-28: qty 2

## 2024-07-28 MED ORDER — POTASSIUM CHLORIDE 10 MEQ/50ML IV SOLN
10.0000 meq | INTRAVENOUS | Status: AC
  Administered 2024-07-28 (×4): 10 meq via INTRAVENOUS
  Filled 2024-07-28 (×4): qty 50

## 2024-07-28 MED ORDER — POTASSIUM CHLORIDE 10 MEQ/50ML IV SOLN
10.0000 meq | INTRAVENOUS | Status: AC
  Administered 2024-07-28 – 2024-07-29 (×6): 10 meq via INTRAVENOUS
  Filled 2024-07-28 (×4): qty 50

## 2024-07-28 MED ORDER — INSULIN ASPART 100 UNIT/ML IJ SOLN
2.0000 [IU] | INTRAMUSCULAR | Status: DC
  Administered 2024-07-28 (×2): 2 [IU] via SUBCUTANEOUS
  Administered 2024-07-29: 6 [IU] via SUBCUTANEOUS
  Administered 2024-07-29 – 2024-07-30 (×3): 2 [IU] via SUBCUTANEOUS
  Administered 2024-07-30: 4 [IU] via SUBCUTANEOUS
  Administered 2024-07-30 – 2024-07-31 (×6): 2 [IU] via SUBCUTANEOUS
  Administered 2024-07-31: 13:00:00 4 [IU] via SUBCUTANEOUS
  Filled 2024-07-28 (×8): qty 2
  Filled 2024-07-28: qty 4
  Filled 2024-07-28 (×2): qty 2
  Filled 2024-07-28: qty 4
  Filled 2024-07-28: qty 2
  Filled 2024-07-28: qty 6

## 2024-07-28 MED ORDER — LABETALOL HCL 5 MG/ML IV SOLN
10.0000 mg | INTRAVENOUS | Status: DC | PRN
  Administered 2024-07-28 (×3): 10 mg via INTRAVENOUS
  Filled 2024-07-28 (×2): qty 4

## 2024-07-28 NOTE — Progress Notes (Addendum)
 NAME:  Jorge Calhoun, MRN:  [id number], DOB:  1965/12/24, LOS: 8 ADMISSION DATE:  07/20/2024, CONSULTATION DATE:  07/20/2024 REFERRING MD:  EDP, CHIEF COMPLAINT:  AMS   History of Present Illness:  58 year old man who presented to [facility] 12/4 after being found down unresponsive at home. PMHx significant for HTN, PSVT, T1DM, hyperthyroidism, chronic back pain.  Patient presented to the [facility] 12/3 after 3-4 days of feeling generally unwell, poor PO intake, n/v/d. Per spouse, was given something for an infection.  On day of admission (12/4), found unresponsive at home. Brought to ED via EMS; on EMS arrival patient was hypotensive with SBP 80s, SpO2 100% on 2LNC, CBG > 400. Workup in ER included negative head imaging, labs demonstrating profound hyperglycemia, DKA. Fluid resuscitation was completed, bicarb administered and patient was started on insulin  gtt.  PCCM consulted for admission, given degree of AMS and hyperglycemia.   +Rhinovirus/enterovirus. Intubated for airway protection in the setting of AMS.  Pertinent Medical History:   Past Medical History:  Diagnosis Date   Hypertension    Nontoxic multinodular goiter    Obesity, unspecified    Plantar fasciitis    PSVT (paroxysmal supraventricular tachycardia)    noted on heart monitor 02/2020   Type I (juvenile type) diabetes mellitus without mention of complication, not stated as uncontrolled    Significant Hospital Events: Including procedures, antibiotic start and stop dates in addition to other pertinent events   12/4 Admit in DKA, RVP panel positive for rhinovirus/enterovirus 12/5 Progressive decline in mentation with eventual development of unresponsiveness intubated for airway protection with central line and A-line placed as well 12/9, extubated, aline out 12/10 was doing well pending tx out of ICU> acute SOB when OOB> massive bilateral PE with RV dysfunction/ shock> IR thrombectomy, heparin , NE  Interim History/Subjective:   Off norepi  Beta hydroxybutyrate up to 5 yesterday evening - euglycemic Back on insulin  infusion Some diarrhea this morning x 1 Remains on heated high flow 40% 30 LPM - off BiPAP since 12/10  Objective:   Blood pressure 118/72, pulse (!) 108, temperature 99.4 F (37.4 C), temperature source Axillary, resp. rate 17, height 5' 11 (1.803 m), weight 91.4 kg, SpO2 99%. CVP:  [2 mmHg-14 mmHg] 6 mmHg  FiO2 (%):  [40 %-65 %] 40 %   Intake/Output Summary (Last 24 hours) at 07/28/2024 0710 Last data filed at 07/28/2024 0617 Gross per 24 hour  Intake 3730.34 ml  Output 2750 ml  Net 980.34 ml   Filed Weights   07/25/24 0500 07/26/24 0455 07/27/24 0423  Weight: 97.8 kg 98.2 kg 91.4 kg   Physical Examination:  General:  Middle aged male acutely ill appearing resting comfortably in bed HEENT: [place]/AT, PERRL, no JVD Neuro: Spontaneously awake, alert, oriented CV: RRR, no MRG. No edema.  PULM:  Breathing comfortably with sats in the 90s  GI: Soft, NT, ND. Normoactive Extremities: No acute deformity or ROM limitation. Still some oozing from femoral sites.   Tmax 100.2  Labs Na 153, K 3.4, sCr 1.04,  bicarb 22, bun 32, glucose 150, AG 14, mag 2.4,   Patient Lines/Drains/Airways Status     Active Line/Drains/Airways     Name Placement date Placement time Site Days   Arterial Line 07/26/24 Left Radial 07/26/24  1901  Radial  2   Peripheral IV 07/25/24 20 G 1.75 Left;Posterior Forearm 07/25/24  1353  Forearm  3   Peripheral IV 07/26/24 20 G 2.5 Anterior;Left;Upper Arm 07/26/24  1901  Arm  2   PICC Triple Lumen 07/27/24 Right Brachial 42 cm 0 cm 07/27/24  1038  -- 1   External Urinary Catheter 07/28/24  0522  --  less than 1   Wound 07/27/24 2100 Pressure Injury Heel Right Deep Tissue Pressure Injury - Purple or maroon localized area of discolored intact skin or blood-filled blister due to damage of underlying soft tissue from pressure and/or shear. 07/27/24  2100  Heel  1            Resolved Problem List:  Septic shock, resolved Respiratory insufficiency 2/2 encephalopathy r/t DKA  Assessment and Plan:   Acute hypoxic respiratory failure  Massive bilateral pulmonary embolism with cor pulmonale s/p IR thrombectomy 12/10 Cardiogenic/ obstructive shock 2/2 PE - extubated 11/9 - PESI score 109, BOVA score at least 3, BNP 605, trop T hs 147> 169, pre procedure lactic 1.4 - PA pressures pre 55/25 (36) and post 33/18 (25) in IR  - coox 85. CVP 7.  - Lower extremity dopplers negative - Echo 12/11 LVEF 65-70%, mildly reduced RV systolic function. Valves looked fine. IVC collapsible.   P:  - appreciate IR assistance - Heparin  infusion - levophed  off - Continue ICU level hemodynamic monitoring.  - If anything, getting a little hypertensive now.    AKI- in setting of hypotension, IV contrast, diuretics  AGMA Hypernatremia Hypokalemia P:  - repeat BMET q 4hrs - overall I/Os not accurate with unmeasured occurrences.  - support hemodynamics, renal dose meds - Continue d5w for DKA, but also for hypernatermia.  - Trend chemistry - Replacing K now, will likely require more. Checking BMP q 4hours today.    MSSA pneumonia Rhinovirus infection Sinusitis 1 out of 4 bottles +strep viridans. - echo 12/7 EF 70-75%, hyperdynamic, no RWMA, normal RV, no obvious vegetations P:  - day 9 of 10 of ctx - repeat BC 12/6> ngtd  - trend WBC/ fever curve - droplet precautions   DKA, initially resolved now back in DKA while euglycemic. BHB up to 5.  Type 1 diabetes - A1c 8.8 on 12/5 P: - ? Related to Jardiance  - stop - gap closed x 2 transition to basal + ssi - Trend BHB   Hypertension HLD - tele monitoring - hold toprol  xl 50mg , cardizem  CD 360mg , ASA in setting of shock - May need to restart today, but with his tenuous hemodynamics over the past couple of days will wait until it is necessary. PRNs in the mean time.  - hold statin- checking LFTs with RV  dysfunction  Thrombocytopenia, improved - initially felt related to sepsis, but given PE, could also be consumptive with PE, r/o DVT - Trend CBC  Hyperthyroidism - TSH 0.167 on 12/4, FT4 0.66 - will need f/u with endocrinologist   Critical care time:  46 min     Deward Eastern, AGACNP-BC [facility]  See Amion for personal pager PCCM on call pager [phone] until 7pm. Please call Elink 7p-7a. [phone]  07/28/2024 7:15 AM

## 2024-07-28 NOTE — Progress Notes (Signed)
 PHARMACY - ANTICOAGULATION CONSULT NOTE  Pharmacy Consult for Heparin  Indication: Massive BL PE with RHS  Allergies  Allergen Reactions   Jardiance  [Empagliflozin ] Other (See Comments)    Precipitated DKA   Metformin And Related Nausea Only    Patient Measurements: Height: 5' 11 (180.3 cm) Weight: 91.4 kg (201 lb 8 oz) IBW/kg (Calculated) : 75.3 HEPARIN  DW (KG): 98.5 Corrected heparin  dosing weight: 95 kg  Vital Signs: Temp: 99.4 F (37.4 C) (12/12 0345) Temp Source: Axillary (12/12 0345) Pulse Rate: 108 (12/12 0223)  Labs: Recent Labs    07/26/24 1558 07/26/24 1704 07/26/24 1902 07/27/24 0449 07/27/24 0751 07/27/24 0807 07/27/24 1136 07/27/24 1430 07/27/24 1805 07/27/24 2015 07/27/24 2157 07/27/24 2352 07/28/24 0306 07/28/24 0546  HGB 13.6   < >  --  12.5*  --  12.2*  --   --   --   --   --   --   --  10.9*  HCT 41.9   < >  --  39.9  --  36.0*  --   --   --   --   --   --   --  34.0*  PLT 144*  --   --  196  --   --   --   --   --   --   --   --   --  203  LABPROT  --   --  16.1*  --   --   --   --   --   --   --   --   --   --   --   INR  --   --  1.2  --   --   --   --   --   --   --   --   --   --   --   HEPARINUNFRC  --    < >  --  >1.10*  --   --   --  0.94*  --   --  0.54  --   --  0.53  CREATININE  --   --   --  1.28*   < >  --    < >  --    < > 1.09  --  1.07 1.04  --    < > = values in this interval not displayed.    Estimated Creatinine Clearance: 88.4 mL/min (by C-G formula based on SCr of 1.04 mg/dL).   Medical History: Past Medical History:  Diagnosis Date   Hypertension    Nontoxic multinodular goiter    Obesity, unspecified    Plantar fasciitis    PSVT (paroxysmal supraventricular tachycardia)    noted on heart monitor 02/2020   Type I (juvenile type) diabetes mellitus without mention of complication, not stated as uncontrolled     Assessment: AC/Heme: SCDs/ASA only since 12/7 d/t low Plt 12/10: Acute deterioration with CTA positive  for large acute bilateral PE with RHS  to start IV heparin . Noted bleeding from L groin where previous CVL was removed. Plts up to 144   Today, 07/28/2024 05:46 Confirmatory heparin  level = 0.53, remains therapeutic at heparin  infusing at 1200 units/hr Hgb down to 10.9, plts WNL Per RN no bleeding or other heparin  related issues  Goal of Therapy:  Heparin  level 0.3-0.7 units/ml Monitor platelets by anticoagulation protocol: Yes   Plan:  Continue heparin  infusion at 1200 units/hr Monitor daily heparin  level, CBC, signs/symptoms of bleeding  Thank you for allowing pharmacy to be a part of this patients care.  Eleanor EMERSON Agent, PharmD, BCPS Clinical Pharmacist [place] 07/28/2024 7:20 AM

## 2024-07-28 NOTE — Progress Notes (Signed)
 [facility] ADULT ICU REPLACEMENT PROTOCOL   The patient does apply for the [facility] Adult ICU Electrolyte Replacment Protocol based on the criteria listed below:   1.Exclusion criteria: TCTS, ECMO, Dialysis, and Myasthenia Gravis patients 2. Is GFR >/= 30 ml/min? Yes.    Patient's GFR today is >60 3. Is SCr </= 2? Yes.   Patient's SCr is 1.04 mg/dL 4. Did SCr increase >/= 0.5 in 24 hours? No. 5.Pt's weight >40kg  Yes.   6. Abnormal electrolyte(s): k 3.4  7. Electrolytes replaced per protocol 8.  Call MD STAT for K+ </= 2.5, Phos </= 1, or Mag </= 1 Physician:    Krishav Mamone A 07/28/2024 6:06 AM

## 2024-07-28 NOTE — Progress Notes (Signed)
 Physical Therapy Treatment Patient Details Name: Jorge Calhoun MRN: [id number] DOB: 05/14/1966 Today's Date: 07/28/2024   History of Present Illness Jorge Calhoun is a 58 y/o M who presents for DKA likely in the setting of rhinovirus URTI, wife found on bathroom floor unresponsive, required invasive mechanical ventilation for metabolic encephalopathy.  12/10 pt with significant desaturation into 60's when up on BSC and needed Bipap and found to have B PE and underwent IR thrombectomy. PMH significant for type I DM, HTN, hyperthyroidism    PT Comments  Patient progressing to back OOB despite medical issues as noted above.  He was on 10L salter HFNC and SpO2 maintained at 100%.  Still very slow to move and cautious given multiple lines needing +2 A for safety and for lifting help.  Feel patient remains appropriate for post-acute inpatient rehab (>3 hours/day) at d/c.  PT will continue to follow.     If plan is discharge home, recommend the following: A lot of help with walking and/or transfers;A lot of help with bathing/dressing/bathroom;Assistance with cooking/housework;Help with stairs or ramp for entrance;Assist for transportation;Direct supervision/assist for financial management   Can travel by private vehicle        Equipment Recommendations   (TBA)    Recommendations for Other Services       Precautions / Restrictions Precautions Precautions: Fall Recall of Precautions/Restrictions: Impaired Precaution/Restrictions Comments: watch HR     Mobility  Bed Mobility Overal bed mobility: Needs Assistance Bed Mobility: Rolling, Sidelying to Sit Rolling: Mod assist Sidelying to sit: Mod assist, +2 for physical assistance, Max assist       General bed mobility comments: assist for legs off EOB and to lift trunk    Transfers Overall transfer level: Needs assistance Equipment used: Rolling walker (2 wheels) Transfers: Sit to/from Stand, Bed to chair/wheelchair/BSC Sit to  Stand: Mod assist, +2 physical assistance, +2 safety/equipment, From elevated surface   Step pivot transfers: Mod assist, +2 physical assistance, +2 safety/equipment, From elevated surface       General transfer comment: stood to RW with increased time and some lifting help; able to step very slowly small steps with RW to get to recliner    Ambulation/Gait                   Stairs             Wheelchair Mobility     Tilt Bed    Modified Rankin (Stroke Patients Only)       Balance                                            Communication Communication Communication: Impaired Factors Affecting Communication: Difficulty expressing self;Reduced clarity of speech  Cognition Arousal: Alert Behavior During Therapy: Flat affect   PT - Cognitive impairments: Problem solving                       PT - Cognition Comments: slow processing Following commands: Impaired Following commands impaired: Follows one step commands with increased time    Cueing Cueing Techniques: Verbal cues, Gestural cues, Tactile cues  Exercises General Exercises - Upper Extremity Shoulder Flexion: AROM, Both, Seated, Other reps (comment) Elbow Extension: AROM, Both, Seated, Other reps (comment) General Exercises - Lower Extremity Ankle Circles/Pumps: AROM, 10 reps, Both Short Arc Quad: AROM, Both, 5 reps, Supine  Heel Slides: AROM, Both, 5 reps, Supine, AAROM    General Comments        Pertinent Vitals/Pain Pain Assessment Pain Assessment: Faces Faces Pain Scale: Hurts little more Pain Location: bottom of R foot Pain Descriptors / Indicators: Tender Pain Intervention(s): Monitored during session, Repositioned    Home Living                          Prior Function            PT Goals (current goals can now be found in the care plan section) Progress towards PT goals: Progressing toward goals    Frequency    Min 3X/week       PT Plan      Co-evaluation              AM-PAC PT 6 Clicks Mobility   Outcome Measure  Help needed turning from your back to your side while in a flat bed without using bedrails?: A Lot Help needed moving from lying on your back to sitting on the side of a flat bed without using bedrails?: Total Help needed moving to and from a bed to a chair (including a wheelchair)?: Total Help needed standing up from a chair using your arms (e.g., wheelchair or bedside chair)?: Total Help needed to walk in hospital room?: Total Help needed climbing 3-5 steps with a railing? : Total 6 Click Score: 7    End of Session Equipment Utilized During Treatment: Gait belt Activity Tolerance: Patient limited by fatigue Patient left: in chair;with call bell/phone within reach;with family/visitor present;with nursing/sitter in room Nurse Communication: Mobility status PT Visit Diagnosis: Unsteadiness on feet (R26.81);Muscle weakness (generalized) (M62.81);Difficulty in walking, not elsewhere classified (R26.2)     Time: 8459-8381 PT Time Calculation (min) (ACUTE ONLY): 38 min  Charges:    $Therapeutic Activity: 23-37 mins PT General Charges $$ ACUTE PT VISIT: 1 Visit                     Micheline Calhoun, PT Acute Rehabilitation Services Office:[phone] 07/28/2024    Jorge Calhoun 07/28/2024, 4:52 PM

## 2024-07-28 NOTE — Progress Notes (Signed)
 Inpatient Rehab Admissions Coordinator:   Note off levo.  Still with high O2 requirements which are beyond what we can manage on CIR.  Will see how he's doing on Monday.   Reche Lowers, PT, DPT Admissions Coordinator [phone] 07/28/2024 2:16 PM

## 2024-07-29 ENCOUNTER — Inpatient Hospital Stay (HOSPITAL_COMMUNITY)

## 2024-07-29 DIAGNOSIS — N179 Acute kidney failure, unspecified: Secondary | ICD-10-CM | POA: Diagnosis not present

## 2024-07-29 DIAGNOSIS — I2602 Saddle embolus of pulmonary artery with acute cor pulmonale: Secondary | ICD-10-CM

## 2024-07-29 DIAGNOSIS — E785 Hyperlipidemia, unspecified: Secondary | ICD-10-CM

## 2024-07-29 DIAGNOSIS — J15211 Pneumonia due to Methicillin susceptible Staphylococcus aureus: Secondary | ICD-10-CM

## 2024-07-29 DIAGNOSIS — Z794 Long term (current) use of insulin: Secondary | ICD-10-CM | POA: Diagnosis not present

## 2024-07-29 DIAGNOSIS — E876 Hypokalemia: Secondary | ICD-10-CM

## 2024-07-29 DIAGNOSIS — J9601 Acute respiratory failure with hypoxia: Secondary | ICD-10-CM | POA: Diagnosis not present

## 2024-07-29 DIAGNOSIS — I10 Essential (primary) hypertension: Secondary | ICD-10-CM | POA: Diagnosis not present

## 2024-07-29 DIAGNOSIS — E119 Type 2 diabetes mellitus without complications: Secondary | ICD-10-CM | POA: Diagnosis not present

## 2024-07-29 DIAGNOSIS — E870 Hyperosmolality and hypernatremia: Secondary | ICD-10-CM | POA: Diagnosis not present

## 2024-07-29 LAB — CBC
HCT: 33.5 % — ABNORMAL LOW (ref 39.0–52.0)
Hemoglobin: 10.8 g/dL — ABNORMAL LOW (ref 13.0–17.0)
MCH: 31.4 pg (ref 26.0–34.0)
MCHC: 32.2 g/dL (ref 30.0–36.0)
MCV: 97.4 fL (ref 80.0–100.0)
Platelets: 215 K/uL (ref 150–400)
RBC: 3.44 MIL/uL — ABNORMAL LOW (ref 4.22–5.81)
RDW: 14.3 % (ref 11.5–15.5)
WBC: 5.7 K/uL (ref 4.0–10.5)
nRBC: 0 % (ref 0.0–0.2)

## 2024-07-29 LAB — BASIC METABOLIC PANEL WITH GFR
Anion gap: 16 — ABNORMAL HIGH (ref 5–15)
Anion gap: 17 — ABNORMAL HIGH (ref 5–15)
BUN: 14 mg/dL (ref 6–20)
BUN: 14 mg/dL (ref 6–20)
CO2: 21 mmol/L — ABNORMAL LOW (ref 22–32)
CO2: 18 mmol/L — ABNORMAL LOW (ref 22–32)
Calcium: 7.5 mg/dL — ABNORMAL LOW (ref 8.9–10.3)
Calcium: 7.3 mg/dL — ABNORMAL LOW (ref 8.9–10.3)
Chloride: 114 mmol/L — ABNORMAL HIGH (ref 98–111)
Chloride: 110 mmol/L (ref 98–111)
Creatinine, Ser: 0.82 mg/dL (ref 0.61–1.24)
Creatinine, Ser: 0.81 mg/dL (ref 0.61–1.24)
GFR, Estimated: >60 mL/min (ref >60)
GFR, Estimated: >60 mL/min (ref >60)
Glucose, Bld: 141 mg/dL — ABNORMAL HIGH (ref 70–99)
Glucose, Bld: 226 mg/dL — ABNORMAL HIGH (ref 70–99)
Potassium: 3.4 mmol/L — ABNORMAL LOW (ref 3.5–5.1)
Potassium: 3.6 mmol/L (ref 3.5–5.1)
Sodium: 151 mmol/L — ABNORMAL HIGH (ref 135–145)
Sodium: 145 mmol/L (ref 135–145)

## 2024-07-29 LAB — GLUCOSE, CAPILLARY
Glucose-Capillary: 109 mg/dL — ABNORMAL HIGH (ref 70–99)
Glucose-Capillary: 116 mg/dL — ABNORMAL HIGH (ref 70–99)
Glucose-Capillary: 131 mg/dL — ABNORMAL HIGH (ref 70–99)
Glucose-Capillary: 135 mg/dL — ABNORMAL HIGH (ref 70–99)
Glucose-Capillary: 211 mg/dL — ABNORMAL HIGH (ref 70–99)
Glucose-Capillary: 91 mg/dL (ref 70–99)
Glucose-Capillary: 97 mg/dL (ref 70–99)

## 2024-07-29 LAB — BETA-HYDROXYBUTYRIC ACID
Beta-Hydroxybutyric Acid: 4.3 mmol/L — ABNORMAL HIGH (ref 0.05–0.27)
Beta-Hydroxybutyric Acid: 5.86 mmol/L — ABNORMAL HIGH (ref 0.05–0.27)
Beta-Hydroxybutyric Acid: 6.44 mmol/L — ABNORMAL HIGH (ref 0.05–0.27)

## 2024-07-29 LAB — HEPARIN LEVEL (UNFRACTIONATED)
Heparin Unfractionated: 0.31 [IU]/mL (ref 0.30–0.70)
Heparin Unfractionated: 0.45 [IU]/mL (ref 0.30–0.70)

## 2024-07-29 LAB — PHOSPHORUS: Phosphorus: 1.9 mg/dL — ABNORMAL LOW (ref 2.5–4.6)

## 2024-07-29 LAB — MAGNESIUM: Magnesium: 2.2 mg/dL (ref 1.7–2.4)

## 2024-07-29 MED ORDER — DEXTROSE 5 % IV SOLN: INTRAVENOUS | Status: DC

## 2024-07-29 MED ORDER — ENSURE PLUS HIGH PROTEIN PO LIQD: 237.0000 mL | Freq: Two times a day (BID) | ORAL | Status: DC

## 2024-07-29 MED ORDER — ACETAMINOPHEN 10 MG/ML IV SOLN: 1000.0000 mg | Freq: Four times a day (QID) | INTRAVENOUS | Status: DC | PRN

## 2024-07-29 MED ORDER — POTASSIUM PHOSPHATES 15 MMOLE/5ML IV SOLN
15.0000 mmol | Freq: Once | INTRAVENOUS | Status: AC
  Administered 2024-07-29: 15 mmol via INTRAVENOUS
  Filled 2024-07-29: qty 5

## 2024-07-29 MED ORDER — PNEUMOCOCCAL 20-VAL CONJ VACC 0.5 ML IM SUSY
0.5000 mL | PREFILLED_SYRINGE | INTRAMUSCULAR | Status: DC
  Filled 2024-07-29: qty 0.5

## 2024-07-29 MED ORDER — POTASSIUM CHLORIDE 10 MEQ/100ML IV SOLN
10.0000 meq | INTRAVENOUS | Status: AC
  Administered 2024-07-29 (×4): 10 meq via INTRAVENOUS
  Filled 2024-07-29 (×4): qty 100

## 2024-07-29 NOTE — Progress Notes (Signed)
 NAME:  Jorge Calhoun, MRN:  [id number], DOB:  1966-03-31, LOS: 9 ADMISSION DATE:  07/20/2024, CONSULTATION DATE:  07/20/2024 REFERRING MD:  EDP, CHIEF COMPLAINT:  AMS   History of Present Illness:  58 year old man who presented to [facility] 12/4 after being found down unresponsive at home. PMHx significant for HTN, PSVT, T1DM, hyperthyroidism, chronic back pain.  Patient presented to the [facility] 12/3 after 3-4 days of feeling generally unwell, poor PO intake, n/v/d. Per spouse, was given something for an infection.  On day of admission (12/4), found unresponsive at home. Brought to ED via EMS; on EMS arrival patient was hypotensive with SBP 80s, SpO2 100% on 2LNC, CBG > 400. Workup in ER included negative head imaging, labs demonstrating profound hyperglycemia, DKA. Fluid resuscitation was completed, bicarb administered and patient was started on insulin  gtt.  PCCM consulted for admission, given degree of AMS and hyperglycemia.   +Rhinovirus/enterovirus. Intubated for airway protection in the setting of AMS.  Pertinent Medical History:   Past Medical History:  Diagnosis Date   Hypertension    Nontoxic multinodular goiter    Obesity, unspecified    Plantar fasciitis    PSVT (paroxysmal supraventricular tachycardia)    noted on heart monitor 02/2020   Type I (juvenile type) diabetes mellitus without mention of complication, not stated as uncontrolled    Significant Hospital Events: Including procedures, antibiotic start and stop dates in addition to other pertinent events   12/4 Admit in DKA, RVP panel positive for rhinovirus/enterovirus 12/5 Progressive decline in mentation with eventual development of unresponsiveness intubated for airway protection with central line and A-line placed as well 12/9, extubated, aline out 12/10 was doing well pending tx out of ICU> acute SOB when OOB> massive bilateral PE with RV dysfunction/ shock> IR thrombectomy, heparin , NE 12/11 - 12/12 --> euglycemic  DKA on insulin  drip, levo weaned off  Interim History/Subjective:  Afebrile, HR 80s - 90s, hemodynamics stable off pressors, SPO2 > 90% on 3 L O2 by [place] I/O: - 1.1 L (overall + 13 L) Patient reports feeling much better today. No breathing problems. More awake. He has weakness from being in bed so long. A line is planned to be pulled out today.  Objective:   Blood pressure (!) 149/69, pulse 92, temperature 98.8 F (37.1 C), temperature source Axillary, resp. rate 13, height 5' 11 (1.803 m), weight 91.4 kg, SpO2 94%. CVP:  [2 mmHg-6 mmHg] 5 mmHg      Intake/Output Summary (Last 24 hours) at 07/29/2024 0943 Last data filed at 07/29/2024 0600 Gross per 24 hour  Intake 1365.1 ml  Output 1500 ml  Net -134.9 ml   Filed Weights   07/25/24 0500 07/26/24 0455 07/27/24 0423  Weight: 97.8 kg 98.2 kg 91.4 kg   Physical Examination:  General: well appearing, no distress Eyes: PERRL, no scleral icterus ENMT: oropharynx clear, good dentition, no oral lesions, mallampati score IV Skin: warm, intact, no rashes Neck: JVD 8-10 cm H2O, ROM and lymph node assessment normal CV: RRR, no MRG, nl S1 and S2, 1+ pitting peripheral edema Resp: clear to auscultation bilaterally, no wheezes, rales, or rhonchi, normal effort, no clubbing/cyanosis Abdom: Normoactive bowel sounds, soft, nontender Neuro: Awake alert oriented to person place time and situation   Patient Lines/Drains/Airways Status     Active Line/Drains/Airways     Name Placement date Placement time Site Days   Arterial Line 07/26/24 Left Radial 07/26/24  1901  Radial  3   Peripheral IV 07/25/24 20  G 1.75 Left;Posterior Forearm 07/25/24  1353  Forearm  4   Peripheral IV 07/26/24 20 G 2.5 Anterior;Left;Upper Arm 07/26/24  1901   Arm  3   PICC Triple Lumen 07/27/24 Right Brachial 42 cm 0 cm 07/27/24  1038  -- 2   External Urinary Catheter 07/28/24  0522  --  1   Wound 07/27/24 2100 Pressure Injury Heel Right Deep Tissue Pressure Injury -  Purple or maroon localized area of discolored intact skin or blood-filled blister due to damage of underlying soft tissue from pressure and/or shear. 07/27/24  2100  Heel  2           Resolved Problem List:  Septic shock, resolved Respiratory insufficiency 2/2 encephalopathy r/t DKA, resolved. DKA, resolved  Assessment and Plan:   Acute hypoxic respiratory failure  Massive bilateral pulmonary embolism with cor pulmonale s/p IR thrombectomy 12/10 Obstructive shock 2/2 PE - RESOLVED - extubated 11/9 - PESI score 109, BOVA 4, BNP 605, trop T hs 147> 169, pre procedure lactic 1.4 - PA pressures pre 55/25 (36) and post 33/18 (25) in IR  - coox 85. CVP 7.  - Lower extremity dopplers negative - Echo 12/11 LVEF 65-70%, mildly reduced RV systolic function. Valves looked fine. IVC collapsible.   P:  - appreciate IR assistance - Heparin  infusion  AKI- in setting of hypotension, IV contrast, diuretics; RESOLVED. AGMA - RESOLVED Hypernatremia - Improving Hypokalemia  P:  - FWD 2.7 L - Continue D5W 110 cc/hr - Trend labs Q 8 H - Replete K as needed  MSSA pneumonia Rhinovirus infection Sinusitis 1 out of 4 bottles +strep viridans. - echo 12/7 EF 70-75%, hyperdynamic, no RWMA, normal RV, no obvious vegetations P:  - CTX (end date 12/13)   DKA, resolved Type 1 diabetes - A1c 8.8 on 12/5 P: - Long acting 5 units BID - SSI   Hypertension HLD - continue metop 25 mg BID  Disposition: Will transfer to progressive care unit.  Thank you for this interesting consult. I have spent 35 minutes evaluating patient, reviewing chart, and discussing plan of care with patient, family, and primary medical team. If you have any questions or concerns please reach out to me via secure chat.  Paula Southerly, MD  Pulmonary and Critical Care   07/29/2024 9:43 AM

## 2024-07-29 NOTE — Progress Notes (Signed)
 PHARMACY - ANTICOAGULATION CONSULT NOTE  Pharmacy Consult for Heparin  Indication: Massive BL PE with RHS  Allergies  Allergen Reactions   Jardiance  [Empagliflozin ] Other (See Comments)    Precipitated DKA   Metformin And Related Nausea Only    Patient Measurements: Height: 5' 11 (180.3 cm) Weight: 91.4 kg (201 lb 8 oz) IBW/kg (Calculated) : 75.3 HEPARIN  DW (KG): 98.5 Corrected heparin  dosing weight: 95 kg  Vital Signs: Temp: 98.8 F (37.1 C) (12/13 0730) Temp Source: Axillary (12/13 0730) BP: 149/69 (12/13 0700) Pulse Rate: 92 (12/13 0700)  Labs: Recent Labs    07/26/24 1902 07/27/24 0449 07/27/24 0751 07/27/24 0807 07/27/24 1136 07/27/24 2157 07/27/24 2352 07/28/24 0546 07/28/24 0730 07/28/24 1145 07/28/24 1550 07/29/24 0536  HGB  --  12.5*  --  12.2*  --   --   --  10.9*  --   --   --  10.8*  HCT  --  39.9  --  36.0*  --   --   --  34.0*  --   --   --  33.5*  PLT  --  196  --   --   --   --   --  203  --   --   --  215  LABPROT 16.1*  --   --   --   --   --   --   --   --   --   --   --   INR 1.2  --   --   --   --   --   --   --   --   --   --   --   HEPARINUNFRC  --  >1.10*  --   --    < > 0.54  --  0.53  --   --   --  0.31  CREATININE  --  1.28*   < >  --    < >  --    < >  --  0.99 0.97 0.89  --    < > = values in this interval not displayed.    Estimated Creatinine Clearance: 103.3 mL/min (by C-G formula based on SCr of 0.89 mg/dL).   Medical History: Past Medical History:  Diagnosis Date   Hypertension    Nontoxic multinodular goiter    Obesity, unspecified    Plantar fasciitis    PSVT (paroxysmal supraventricular tachycardia)    noted on heart monitor 02/2020   Type I (juvenile type) diabetes mellitus without mention of complication, not stated as uncontrolled     Assessment: AC/Heme: SCDs/ASA only since 12/7 d/t low Plt 12/10: Acute deterioration with CTA positive for large acute bilateral PE with RHS  to start IV heparin . Noted bleeding  from L groin where previous CVL was removed. Plts up to 144   Today, 07/29/2024 05:36 heparin  level = 0.31, remains therapeutic but has significantly dropped to low end of goal with heparin  infusing at 1200 units/hr Hgb down slightly to 10.8, plts WNL Per RN no bleeding or other heparin  related issues  Goal of Therapy:  Heparin  level 0.3-0.7 units/ml Monitor platelets by anticoagulation protocol: Yes   Plan:  Increase IV heparin  infusion to 1300 units/hr 6 hr heparin  level after rate change Monitor daily heparin  level, CBC, signs/symptoms of bleeding    Thank you for allowing pharmacy to be a part of this patients care.  Eleanor EMERSON Agent, PharmD, BCPS Clinical Pharmacist [place] 07/29/2024 9:40 AM

## 2024-07-29 NOTE — Progress Notes (Signed)
 PHARMACY - ANTICOAGULATION CONSULT NOTE  Pharmacy Consult for Heparin  Indication: Massive BL PE with RHS  Allergies  Allergen Reactions   Jardiance  [Empagliflozin ] Other (See Comments)    Precipitated DKA   Metformin And Related Nausea Only    Patient Measurements: Height: 5' 11 (180.3 cm) Weight: 91.4 kg (201 lb 8 oz) IBW/kg (Calculated) : 75.3 HEPARIN  DW (KG): 98.5 Corrected heparin  dosing weight: 95 kg  Vital Signs: Temp: 98.4 F (36.9 C) (12/13 1130) Temp Source: Oral (12/13 1130) BP: 146/86 (12/13 1800) Pulse Rate: 95 (12/13 1800)  Labs: Recent Labs     0000 07/26/24 1902 07/27/24 0449 07/27/24 0751 07/27/24 0807 07/27/24 1136 07/28/24 0546 07/28/24 0730 07/28/24 1550 07/29/24 0536 07/29/24 1005 07/29/24 1650  HGB   < >  --  12.5*  --  12.2*  --  10.9*  --   --  10.8*  --   --   HCT   < >  --  39.9  --  36.0*  --  34.0*  --   --  33.5*  --   --   PLT  --   --  196  --   --   --  203  --   --  215  --   --   LABPROT  --  16.1*  --   --   --   --   --   --   --   --   --   --   INR  --  1.2  --   --   --   --   --   --   --   --   --   --   HEPARINUNFRC  --   --  >1.10*  --   --    < > 0.53  --   --  0.31  --  0.45  CREATININE  --   --  1.28*   < >  --    < >  --    < > 0.89  --  0.82 0.81   < > = values in this interval not displayed.    Estimated Creatinine Clearance: 113.5 mL/min (by C-G formula based on SCr of 0.81 mg/dL).   Medical History: Past Medical History:  Diagnosis Date   Hypertension    Nontoxic multinodular goiter    Obesity, unspecified    Plantar fasciitis    PSVT (paroxysmal supraventricular tachycardia)    noted on heart monitor 02/2020   Type I (juvenile type) diabetes mellitus without mention of complication, not stated as uncontrolled     Assessment: AC/Heme: SCDs/ASA only since 12/7 d/t low Plt 12/10: Acute deterioration with CTA positive for large acute bilateral PE with RHS  to start IV heparin . Noted bleeding from L  groin where previous CVL was removed. Plts up to 144   Today, 07/29/2024 Heparin  level = 0.45,  therapeutic  at 1300 units/hr Hgb down slightly to 10.8, plts WNL Per RN no bleeding or other heparin  related issues  Goal of Therapy:  Heparin  level 0.3-0.7 units/ml Monitor platelets by anticoagulation protocol: Yes   Plan:  Continue  IV heparin  infusion to 1300 units/hr Monitor daily heparin  level, CBC, signs/symptoms of bleeding     Dolphus Roller, PharmD, BCPS 07/29/2024 6:51 PM

## 2024-07-29 NOTE — Progress Notes (Signed)
 Na now corrected to 145, stopped D5W. Continue oral hydration.

## 2024-07-29 NOTE — Plan of Care (Incomplete)
  Problem: Education: Goal: Knowledge of General Education information will improve Description: Including pain rating scale, medication(s)/side effects and non-pharmacologic comfort measures Outcome: Progressing   Problem: Health Behavior/Discharge Planning: Goal: Ability to manage health-related needs will improve Outcome: Progressing   Problem: Clinical Measurements: Goal: Ability to maintain clinical measurements within normal limits will improve Outcome: Progressing Goal: Will remain free from infection Outcome: Progressing Goal: Diagnostic test results will improve Outcome: Progressing Goal: Respiratory complications will improve Outcome: Progressing Goal: Cardiovascular complication will be avoided Outcome: Progressing   Problem: Activity: Goal: Risk for activity intolerance will decrease Outcome: Progressing   Problem: Nutrition: Goal: Adequate nutrition will be maintained Outcome: Progressing   Problem: Coping: Goal: Level of anxiety will decrease Outcome: Progressing   Problem: Elimination: Goal: Will not experience complications related to bowel motility Outcome: Progressing Goal: Will not experience complications related to urinary retention Outcome: Progressing   Problem: Pain Managment: Goal: General experience of comfort will improve and/or be controlled Outcome: Progressing   Problem: Safety: Goal: Ability to remain free from injury will improve Outcome: Progressing   Problem: Skin Integrity: Goal: Risk for impaired skin integrity will decrease Outcome: Progressing   Problem: Activity: Goal: Ability to tolerate increased activity will improve Outcome: Progressing   Problem: Respiratory: Goal: Ability to maintain a clear airway and adequate ventilation will improve Outcome: Progressing   Problem: Role Relationship: Goal: Method of communication will improve Outcome: Progressing   Problem: Safety: Goal: Non-violent Restraint(s) Outcome:  Progressing   Problem: Education: Goal: Ability to describe self-care measures that may prevent or decrease complications (Diabetes Survival Skills Education) will improve Outcome: Progressing Goal: Individualized Educational Video(s) Outcome: Progressing   Problem: Coping: Goal: Ability to adjust to condition or change in health will improve Outcome: Progressing   Problem: Fluid Volume: Goal: Ability to maintain a balanced intake and output will improve Outcome: Progressing   Problem: Health Behavior/Discharge Planning: Goal: Ability to identify and utilize available resources and services will improve Outcome: Progressing Goal: Ability to manage health-related needs will improve Outcome: Progressing   Problem: Metabolic: Goal: Ability to maintain appropriate glucose levels will improve Outcome: Progressing   Problem: Nutritional: Goal: Maintenance of adequate nutrition will improve Outcome: Progressing Goal: Progress toward achieving an optimal weight will improve Outcome: Progressing   Problem: Skin Integrity: Goal: Risk for impaired skin integrity will decrease Outcome: Progressing   Problem: Tissue Perfusion: Goal: Adequacy of tissue perfusion will improve Outcome: Progressing

## 2024-07-30 DIAGNOSIS — E1011 Type 1 diabetes mellitus with ketoacidosis with coma: Secondary | ICD-10-CM

## 2024-07-30 LAB — CBC
HCT: 32.1 % — ABNORMAL LOW (ref 39.0–52.0)
Hemoglobin: 10.8 g/dL — ABNORMAL LOW (ref 13.0–17.0)
MCH: 31.6 pg (ref 26.0–34.0)
MCHC: 33.6 g/dL (ref 30.0–36.0)
MCV: 93.9 fL (ref 80.0–100.0)
Platelets: 234 K/uL (ref 150–400)
RBC: 3.42 MIL/uL — ABNORMAL LOW (ref 4.22–5.81)
RDW: 13.6 % (ref 11.5–15.5)
WBC: 6.1 K/uL (ref 4.0–10.5)
nRBC: 0.3 % — ABNORMAL HIGH (ref 0.0–0.2)

## 2024-07-30 LAB — BASIC METABOLIC PANEL WITH GFR
Anion gap: 16 — ABNORMAL HIGH (ref 5–15)
BUN: 11 mg/dL (ref 6–20)
CO2: 21 mmol/L — ABNORMAL LOW (ref 22–32)
Calcium: 8 mg/dL — ABNORMAL LOW (ref 8.9–10.3)
Chloride: 109 mmol/L (ref 98–111)
Creatinine, Ser: 0.8 mg/dL (ref 0.61–1.24)
GFR, Estimated: >60 mL/min (ref >60)
Glucose, Bld: 132 mg/dL — ABNORMAL HIGH (ref 70–99)
Potassium: 3.4 mmol/L — ABNORMAL LOW (ref 3.5–5.1)
Sodium: 146 mmol/L — ABNORMAL HIGH (ref 135–145)

## 2024-07-30 LAB — GLUCOSE, CAPILLARY
Glucose-Capillary: 139 mg/dL — ABNORMAL HIGH (ref 70–99)
Glucose-Capillary: 144 mg/dL — ABNORMAL HIGH (ref 70–99)
Glucose-Capillary: 157 mg/dL — ABNORMAL HIGH (ref 70–99)
Glucose-Capillary: 121 mg/dL — ABNORMAL HIGH (ref 70–99)
Glucose-Capillary: 130 mg/dL — ABNORMAL HIGH (ref 70–99)

## 2024-07-30 LAB — PHOSPHORUS: Phosphorus: 2.1 mg/dL — ABNORMAL LOW (ref 2.5–4.6)

## 2024-07-30 LAB — BETA-HYDROXYBUTYRIC ACID: Beta-Hydroxybutyric Acid: 5.16 mmol/L — ABNORMAL HIGH (ref 0.05–0.27)

## 2024-07-30 LAB — HEPARIN LEVEL (UNFRACTIONATED): Heparin Unfractionated: 0.39 [IU]/mL (ref 0.30–0.70)

## 2024-07-30 MED ORDER — POTASSIUM & SODIUM PHOSPHATES 280-160-250 MG PO PACK
2.0000 | PACK | ORAL | Status: AC
  Administered 2024-07-30 (×3): 2 via ORAL
  Filled 2024-07-30 (×3): qty 2

## 2024-07-30 MED ORDER — POTASSIUM CHLORIDE CRYS ER 20 MEQ PO TBCR
40.0000 meq | EXTENDED_RELEASE_TABLET | Freq: Once | ORAL | Status: AC
  Administered 2024-07-30: 40 meq via ORAL
  Filled 2024-07-30: qty 2

## 2024-07-30 NOTE — Progress Notes (Signed)
 PHARMACY - ANTICOAGULATION CONSULT NOTE  Pharmacy Consult for Heparin  Indication: Massive BL PE with RHS  Allergies  Allergen Reactions   Jardiance  [Empagliflozin ] Other (See Comments)    Precipitated DKA   Metformin And Related Nausea Only    Patient Measurements: Height: 5' 11 (180.3 cm) Weight: 106.9 kg (235 lb 10.8 oz) IBW/kg (Calculated) : 75.3 HEPARIN  DW (KG): 98.5 Corrected heparin  dosing weight: 95 kg  Vital Signs: Temp: 99.2 F (37.3 C) (12/14 0811) Temp Source: Oral (12/14 0811) BP: 151/75 (12/14 0917) Pulse Rate: 88 (12/14 0917)  Labs: Recent Labs    07/28/24 0546 07/28/24 0730 07/29/24 0536 07/29/24 1005 07/29/24 1650 07/30/24 0153  HGB 10.9*  --  10.8*  --   --  10.8*  HCT 34.0*  --  33.5*  --   --  32.1*  PLT 203  --  215  --   --  234  HEPARINUNFRC 0.53  --  0.31  --  0.45 0.39  CREATININE  --    < >  --  0.82 0.81 0.80   < > = values in this interval not displayed.    Estimated Creatinine Clearance: 123.6 mL/min (by C-G formula based on SCr of 0.8 mg/dL).   Medical History: Past Medical History:  Diagnosis Date   Hypertension    Nontoxic multinodular goiter    Obesity, unspecified    Plantar fasciitis    PSVT (paroxysmal supraventricular tachycardia)    noted on heart monitor 02/2020   Type I (juvenile type) diabetes mellitus without mention of complication, not stated as uncontrolled     Assessment: AC/Heme: SCDs/ASA only since 12/7 d/t low Plt 12/10: Acute deterioration with CTA positive for large acute bilateral PE with RHS  to start IV heparin . Noted bleeding from L groin where previous CVL was removed. Plts up to 144   Today, 07/30/2024 Heparin  level = 0.39,  therapeutic  at 1300 units/hr Hgb stable at10.8, plts WNL Per RN no bleeding or other heparin  related issues  Goal of Therapy:  Heparin  level 0.3-0.7 units/ml Monitor platelets by anticoagulation protocol: Yes   Plan:  Continue  IV heparin  infusion to 1300  units/hr Monitor daily heparin  level, CBC, signs/symptoms of bleeding     Dolphus Roller, PharmD, BCPS 07/30/2024 9:34 AM

## 2024-07-30 NOTE — Progress Notes (Addendum)
 PROGRESS NOTE    Jorge Calhoun  FMW:[id number] DOB: 1966-03-20 DOA: 07/20/2024 PCP: Seabron Lenis, MD   Brief Narrative: 58 year old with past medical history significant for hypertension, nontoxic multinodular goiter, obesity, PSVT, type 1 diabetes who was found down unresponsive at home on 12/4.  He presented to the [facility] 12/3 after 3 to 4 days of feeling generally unwell, poor oral intake.  Per wife he received something for an infection.  On the day of admission he was found unresponsive at home.  On EMS arrival he was hypotensive systolic blood pressure in the 80s oxygen saturation 100% on 2 L CBG 400.  He was found to be in DKA, hyperglycemia, received fluids and insulin  drip.  He was found to have rhinovirus positive.  He was intubated for airway protection in the setting of altered mental status.  Hospital course: 12/4 Admit in DKA, RVP panel positive for rhinovirus/enterovirus 12/5 Progressive decline in mentation with eventual development of unresponsiveness intubated for airway protection with central line and A-line placed as well 12/9, extubated, aline out 12/10 was doing well pending tx out of ICU> acute SOB when OOB> massive bilateral PE with RV dysfunction/ shock> IR thrombectomy, heparin , NE 12/11 - 12/12 --> euglycemic DKA on insulin  drip, levo weaned off  Assessment & Plan:   Principal Problem:   DKA (diabetic ketoacidosis) (HCC) Active Problems:   DKA, type 1 (HCC)   Acute saddle pulmonary embolism with acute cor pulmonale (HCC)   1-Acute hypoxic respiratory failure -Massive bilateral PE with cor pulmonale status post IR thrombectomy 12/10 -Obstructive shock secondary to PE: Resolved - Patient initially intubated 12/5 for airway protection.  Subsequently extubated 12/9. - Patient developed acute shortness of breath CT angio showed massive bilateral PE with RV dysfunction and shock, IR performed thrombectomy. -Continue heparin  drip   MSSA pneumonia Rhinovirus  infection 1 out of 4 positive for strep Viridians;  Continue IV antibiotic, currently day 9 of antibiotic Repeated blood cultures 12/13 no growth today.  AKI In the setting of hypotension and IV contrast, diuretics.  Resolved Anion gap metabolic acidosis: Resolved  Hypernatremia: Improving Encourage oral intake  Hypokalemia Replace orally  DKA type 1 diabetes Received insulin  drip Beta hydroxybutyric acid ending down. Continue Lantus  twice daily, continue sliding scale insulin   Hypertension Started on metoprolol  twice daily Hyperlipidemia   Hypophosphatemia: Replete orally   See wound care documentation below  Wound 07/27/24 2100 Pressure Injury Heel Right Deep Tissue Pressure Injury - Purple or maroon localized area of discolored intact skin or blood-filled blister due to damage of underlying soft tissue from pressure and/or shear. (Active)     Nutrition Problem: Inadequate oral intake Etiology: inability to eat    Signs/Symptoms: NPO status    Interventions: Refer to RD note for recommendations, Tube feeding  Estimated body mass index is 32.87 kg/m as calculated from the following:   Height as of this encounter: 5' 11 (1.803 m).   Weight as of this encounter: 106.9 kg.   DVT prophylaxis: Heparin  drip Code Status: Full code Family Communication: Care Disposition Plan:  Status is: Inpatient Remains inpatient appropriate because: Management of PE pneumonia AKI rising, needs CIR    Consultants:  Pulmonologist  Procedures:  Thrombectomy  Antimicrobials:  IV ceftriaxone   Subjective: He is alert, denies pain, he is tired of taking medications by mouth.  He denies pain  Objective: Vitals:   07/29/24 2206 07/29/24 2250 07/30/24 0323 07/30/24 0805  BP: (!) 163/84 (!) 145/76 (!) 140/68   Pulse:  100 91 87   Resp:  16 16 18   Temp:  99.5 F (37.5 C) 99.9 F (37.7 C)   TempSrc:  Oral Oral   SpO2:  97% 95%   Weight:   106.9 kg   Height:         Intake/Output Summary (Last 24 hours) at 07/30/2024 0808 Last data filed at 07/29/2024 2200 Gross per 24 hour  Intake 1040.14 ml  Output 2050 ml  Net -1009.86 ml   Filed Weights   07/26/24 0455 07/27/24 0423 07/30/24 0323  Weight: 98.2 kg 91.4 kg 106.9 kg    Examination:  General exam: Appears calm and comfortable  Respiratory system: Clear to auscultation. Respiratory effort normal. Cardiovascular system: S1 & S2 heard, RRR. No JVD, murmurs, rubs, gallops or clicks. No pedal edema. Gastrointestinal system: Abdomen is nondistended, soft and nontender. No organomegaly or masses felt. Normal bowel sounds heard. Central nervous system: Alert and oriented. No focal neurological deficits. Extremities: Symmetric 5 x 5 power.   Data Reviewed: I have personally reviewed following labs and imaging studies  CBC: Recent Labs  Lab 07/26/24 1558 07/26/24 1704 07/27/24 0449 07/27/24 0807 07/28/24 0546 07/29/24 0536 07/30/24 0153  WBC 8.6  --  13.6*  --  6.7 5.7 6.1  HGB 13.6   < > 12.5* 12.2* 10.9* 10.8* 10.8*  HCT 41.9   < > 39.9 36.0* 34.0* 33.5* 32.1*  MCV 95.2  --  100.0  --  97.4 97.4 93.9  PLT 144*  --  196  --  203 215 234   < > = values in this interval not displayed.   Basic Metabolic Panel: Recent Labs  Lab 07/24/24 0700 07/24/24 1036 07/25/24 0500 07/25/24 1736 07/26/24 0307 07/26/24 1704 07/27/24 0449 07/27/24 0751 07/28/24 0546 07/28/24 0730 07/28/24 1145 07/28/24 1550 07/28/24 1640 07/29/24 0536 07/29/24 1005 07/29/24 1650 07/30/24 0153  NA 139   < > 152*   < > 149*   < > 150*   < >  --    < > 151* 151*  --   --  151* 145 146*  K 3.4*   < > 3.5   < > 4.0   < > 4.6   < >  --    < > 3.4* 3.4*  --   --  3.4* 3.6 3.4*  CL 105   < > 113*   < > 108  --  109   < >  --    < > 115* 115*  --   --  114* 110 109  CO2 26   < > 32   < > 23  --  9*   < >  --    < > 23 26  --   --  21* 18* 21*  GLUCOSE 191*   < > 220*   < > 165*  --  238*   < >  --    < > 157*  121*  --   --  141* 226* 132*  BUN 17   < > 19   < > 22*  --  32*   < >  --    < > 19 17  --   --  14 14 11   CREATININE 1.07   < > 0.96   < > 0.90  --  1.28*   < >  --    < > 0.97 0.89  --   --  0.82 0.81 0.80  CALCIUM  6.9*   < >  8.4*   < > 8.6*  --  8.1*   < >  --    < > 7.5* 7.6*  --   --  7.5* 7.3* 8.0*  MG 2.3  --  2.7*  --   --   --  2.6*  --  2.4  --   --   --  2.5* 2.2  --   --   --   PHOS 2.5  --  2.1*  --  2.8  --   --   --   --   --   --   --   --  1.9*  --   --   --    < > = values in this interval not displayed.   GFR: Estimated Creatinine Clearance: 123.6 mL/min (by C-G formula based on SCr of 0.8 mg/dL). Liver Function Tests: Recent Labs  Lab 07/27/24 0751  AST 28  ALT 26  ALKPHOS 71  BILITOT 0.3  PROT 5.2*  ALBUMIN 3.0*   No results for input(s): LIPASE, AMYLASE in the last 168 hours. No results for input(s): AMMONIA in the last 168 hours. Coagulation Profile: Recent Labs  Lab 07/26/24 1902  INR 1.2   Cardiac Enzymes: No results for input(s): CKTOTAL, CKMB, CKMBINDEX, TROPONINI in the last 168 hours. BNP (last 3 results) Recent Labs    07/26/24 1902  PROBNP 605.0*   HbA1C: No results for input(s): HGBA1C in the last 72 hours. CBG: Recent Labs  Lab 07/29/24 1121 07/29/24 1606 07/29/24 1950 07/29/24 2306 07/30/24 0359  GLUCAP 131* 211* 135* 116* 139*   Lipid Profile: No results for input(s): CHOL, HDL, LDLCALC, TRIG, CHOLHDL, LDLDIRECT in the last 72 hours. Thyroid  Function Tests: No results for input(s): TSH, T4TOTAL, FREET4, T3FREE, THYROIDAB in the last 72 hours. Anemia Panel: No results for input(s): VITAMINB12, FOLATE, FERRITIN, TIBC, IRON, RETICCTPCT in the last 72 hours. Sepsis Labs: Recent Labs  Lab 07/26/24 1903 07/27/24 0753  LATICACIDVEN 1.4 1.8    Recent Results (from the past 240 hours)  Culture, blood (Routine X 2) w Reflex to ID Panel     Status: Abnormal   Collection Time:  07/20/24 10:38 PM   Specimen: BLOOD  Result Value Ref Range Status   Specimen Description BLOOD LEFT ANTECUBITAL  Final   Special Requests   Final    BOTTLES DRAWN AEROBIC AND ANAEROBIC Blood Culture results may not be optimal due to an inadequate volume of blood received in culture bottles   Culture  Setup Time   Final    GRAM POSITIVE COCCI IN PAIRS IN CHAINS ANAEROBIC BOTTLE ONLY CRITICAL RESULT CALLED TO, READ BACK BY AND VERIFIED WITH: PHARMD CRYSTAL ROBERTSON ON 07/21/24 @ 1801 BY DRT Performed at [facility], [address], [place], [place] [address]    Culture STREPTOCOCCUS EQUINUS (A)  Final   Report Status 07/23/2024 FINAL  Final   Organism ID, Bacteria STREPTOCOCCUS EQUINUS  Final      Susceptibility   Streptococcus equinus - MIC*    PENICILLIN 0.12 SENSITIVE Sensitive     CEFTRIAXONE  <=0.12 SENSITIVE Sensitive     ERYTHROMYCIN <=0.12 SENSITIVE Sensitive     LEVOFLOXACIN 2 SENSITIVE Sensitive     VANCOMYCIN 1 SENSITIVE Sensitive     * STREPTOCOCCUS EQUINUS  Blood Culture ID Panel (Reflexed)     Status: Abnormal   Collection Time: 07/20/24 10:38 PM  Result Value Ref Range Status   Enterococcus faecalis NOT DETECTED NOT DETECTED Final  Enterococcus Faecium NOT DETECTED NOT DETECTED Final   Listeria monocytogenes NOT DETECTED NOT DETECTED Final   Staphylococcus species NOT DETECTED NOT DETECTED Final   Staphylococcus aureus (BCID) NOT DETECTED NOT DETECTED Final   Staphylococcus epidermidis NOT DETECTED NOT DETECTED Final   Staphylococcus lugdunensis NOT DETECTED NOT DETECTED Final   Streptococcus species DETECTED (A) NOT DETECTED Final    Comment: Not Enterococcus species, Streptococcus agalactiae, Streptococcus pyogenes, or Streptococcus pneumoniae. CRITICAL RESULT CALLED TO, READ BACK BY AND VERIFIED WITH: PHARMD CRYSTAL ROBERTSON ON 07/21/24 @ 1801 BY DRT    Streptococcus agalactiae NOT DETECTED NOT DETECTED Final   Streptococcus pneumoniae NOT DETECTED NOT  DETECTED Final   Streptococcus pyogenes NOT DETECTED NOT DETECTED Final   A.calcoaceticus-baumannii NOT DETECTED NOT DETECTED Final   Bacteroides fragilis NOT DETECTED NOT DETECTED Final   Enterobacterales NOT DETECTED NOT DETECTED Final   Enterobacter cloacae complex NOT DETECTED NOT DETECTED Final   Escherichia coli NOT DETECTED NOT DETECTED Final   Klebsiella aerogenes NOT DETECTED NOT DETECTED Final   Klebsiella oxytoca NOT DETECTED NOT DETECTED Final   Klebsiella pneumoniae NOT DETECTED NOT DETECTED Final   Proteus species NOT DETECTED NOT DETECTED Final   Salmonella species NOT DETECTED NOT DETECTED Final   Serratia marcescens NOT DETECTED NOT DETECTED Final   Haemophilus influenzae NOT DETECTED NOT DETECTED Final   Neisseria meningitidis NOT DETECTED NOT DETECTED Final   Pseudomonas aeruginosa NOT DETECTED NOT DETECTED Final   Stenotrophomonas maltophilia NOT DETECTED NOT DETECTED Final   Candida albicans NOT DETECTED NOT DETECTED Final   Candida auris NOT DETECTED NOT DETECTED Final   Candida glabrata NOT DETECTED NOT DETECTED Final   Candida krusei NOT DETECTED NOT DETECTED Final   Candida parapsilosis NOT DETECTED NOT DETECTED Final   Candida tropicalis NOT DETECTED NOT DETECTED Final   Cryptococcus neoformans/gattii NOT DETECTED NOT DETECTED Final    Comment: Performed at [facility], [address] [place], [place] [address]  Culture, blood (Routine X 2) w Reflex to ID Panel     Status: None (Preliminary result)   Collection Time: 07/20/24 11:03 PM   Specimen: BLOOD  Result Value Ref Range Status   Specimen Description BLOOD RIGHT ANTECUBITAL  Final   Special Requests   Final    BOTTLES DRAWN AEROBIC AND ANAEROBIC Blood Culture results may not be optimal due to an inadequate volume of blood received in culture bottles   Culture  Setup Time   Final    GRAM POSITIVE RODS AEROBIC BOTTLE ONLY CRITICAL RESULT CALLED TO, READ BACK BY AND VERIFIED WITH:  ELLEN JACKSON  PHARM D 07/25/2024 @1122  BY DD    Culture   Final    GRAM POSITIVE RODS CULTURE REINCUBATED FOR BETTER GROWTH Performed at [facility], [address], [place], [place] [address]    Report Status PENDING  Incomplete  Respiratory (~20 pathogens) panel by PCR     Status: Abnormal   Collection Time: 07/20/24 11:40 PM   Specimen: Nasopharyngeal Swab; Respiratory  Result Value Ref Range Status   Adenovirus NOT DETECTED NOT DETECTED Final   Coronavirus 229E NOT DETECTED NOT DETECTED Final    Comment: (NOTE) The Coronavirus on the Respiratory Panel, DOES NOT test for the novel  Coronavirus (2019 nCoV)    Coronavirus HKU1 NOT DETECTED NOT DETECTED Final   Coronavirus NL63 NOT DETECTED NOT DETECTED Final   Coronavirus OC43 NOT DETECTED NOT DETECTED Final   Metapneumovirus NOT DETECTED NOT DETECTED Final  Rhinovirus / Enterovirus DETECTED (A) NOT DETECTED Final   Influenza A NOT DETECTED NOT DETECTED Final   Influenza B NOT DETECTED NOT DETECTED Final   Parainfluenza Virus 1 NOT DETECTED NOT DETECTED Final   Parainfluenza Virus 2 NOT DETECTED NOT DETECTED Final   Parainfluenza Virus 3 NOT DETECTED NOT DETECTED Final   Parainfluenza Virus 4 NOT DETECTED NOT DETECTED Final   Respiratory Syncytial Virus NOT DETECTED NOT DETECTED Final   Bordetella pertussis NOT DETECTED NOT DETECTED Final   Bordetella Parapertussis NOT DETECTED NOT DETECTED Final   Chlamydophila pneumoniae NOT DETECTED NOT DETECTED Final   Mycoplasma pneumoniae NOT DETECTED NOT DETECTED Final    Comment: Performed at [facility], [address] [place], [place] [address]  Resp panel by RT-PCR (RSV, Flu A&B, Covid) Anterior Nasal Swab     Status: None   Collection Time: 07/20/24 11:40 PM   Specimen: Anterior Nasal Swab  Result Value Ref Range Status   SARS Coronavirus 2 by RT PCR NEGATIVE NEGATIVE Final   Influenza A by PCR NEGATIVE NEGATIVE Final   Influenza B by PCR NEGATIVE NEGATIVE Final    Comment:  (NOTE) The Xpert Xpress SARS-CoV-2/FLU/RSV plus assay is intended as an aid in the diagnosis of influenza from Nasopharyngeal swab specimens and should not be used as a sole basis for treatment. Nasal washings and aspirates are unacceptable for Xpert Xpress SARS-CoV-2/FLU/RSV testing.  Fact Sheet for Patients: bloggercourse.com  Fact Sheet for Healthcare Providers: seriousbroker.it  This test is not yet approved or cleared by the [place]  FDA and has been authorized for detection and/or diagnosis of SARS-CoV-2 by FDA under an Emergency Use Authorization (EUA). This EUA will remain in effect (meaning this test can be used) for the duration of the COVID-19 declaration under Section 564(b)(1) of the Act, 21 U.S.C. section 360bbb-3(b)(1), unless the authorization is terminated or revoked.     Resp Syncytial Virus by PCR NEGATIVE NEGATIVE Final    Comment: (NOTE) Fact Sheet for Patients: bloggercourse.com  Fact Sheet for Healthcare Providers: seriousbroker.it  This test is not yet approved or cleared by the [place]  FDA and has been authorized for detection and/or diagnosis of SARS-CoV-2 by FDA under an Emergency Use Authorization (EUA). This EUA will remain in effect (meaning this test can be used) for the duration of the COVID-19 declaration under Section 564(b)(1) of the Act, 21 U.S.C. section 360bbb-3(b)(1), unless the authorization is terminated or revoked.  Performed at [facility], [address], [place], [place] [address]   Culture, Respiratory w Gram Stain     Status: None   Collection Time: 07/21/24  9:18 AM   Specimen: Tracheal Aspirate; Respiratory  Result Value Ref Range Status   Specimen Description   Final    TRACHEAL ASPIRATE Performed at [facility], [address], [place], [place] [address]    Special Requests   Final     NONE Performed at [facility], [address], [place], [place] [address]    Gram Stain   Final    FEW WBC PRESENT,BOTH PMN AND MONONUCLEAR RARE GRAM POSITIVE COCCI IN PAIRS IN CLUSTERS RARE GRAM POSITIVE RODS    Culture   Final    FEW STAPHYLOCOCCUS AUREUS RARE STREPTOCOCCUS AGALACTIAE TESTING AGAINST S. AGALACTIAE NOT ROUTINELY PERFORMED DUE TO PREDICTABILITY OF AMP/PEN/VAN SUSCEPTIBILITY. Performed at [facility], [address], [place], [place] [address]    Report Status 07/24/2024 FINAL  Final  Organism ID, Bacteria STAPHYLOCOCCUS AUREUS  Final      Susceptibility   Staphylococcus aureus - MIC*    CIPROFLOXACIN <=0.5 SENSITIVE Sensitive     ERYTHROMYCIN <=0.25 SENSITIVE Sensitive     GENTAMICIN <=0.5 SENSITIVE Sensitive     OXACILLIN 0.5 SENSITIVE Sensitive     TETRACYCLINE <=1 SENSITIVE Sensitive     VANCOMYCIN 1 SENSITIVE Sensitive     TRIMETH/SULFA <=10 SENSITIVE Sensitive     CLINDAMYCIN <=0.25 SENSITIVE Sensitive     RIFAMPIN <=0.5 SENSITIVE Sensitive     Inducible Clindamycin NEGATIVE Sensitive     LINEZOLID 2 SENSITIVE Sensitive     * FEW STAPHYLOCOCCUS AUREUS  Culture, blood (Routine X 2) w Reflex to ID Panel     Status: None   Collection Time: 07/22/24  7:25 PM   Specimen: BLOOD RIGHT ARM  Result Value Ref Range Status   Specimen Description   Final    BLOOD RIGHT ARM Performed at [facility], [address] [place], [place] [address]    Special Requests   Final    BOTTLES DRAWN AEROBIC ONLY Blood Culture adequate volume Performed at [facility], [address], [place], [place] [address]    Culture   Final    NO GROWTH 5 DAYS Performed at [facility], [address] [place], [place] [address]    Report Status 07/27/2024 FINAL  Final  Culture, blood (Routine X 2) w Reflex to ID Panel     Status: None   Collection Time: 07/22/24  7:25 PM   Specimen: BLOOD RIGHT ARM  Result Value Ref Range  Status   Specimen Description   Final    BLOOD RIGHT ARM Performed at [facility], [address] [place], [place] [address]    Special Requests   Final    BOTTLES DRAWN AEROBIC ONLY Blood Culture adequate volume Performed at [facility], [address], [place], [place] [address]    Culture   Final    NO GROWTH 5 DAYS Performed at [facility], [address] [place], [place] [address]    Report Status 07/27/2024 FINAL  Final  MRSA Next Gen by PCR, Nasal     Status: None   Collection Time: 07/24/24  9:45 AM   Specimen: Nasal Mucosa; Nasal Swab  Result Value Ref Range Status   MRSA by PCR Next Gen NOT DETECTED NOT DETECTED Final    Comment: (NOTE) The GeneXpert MRSA Assay (FDA approved for NASAL specimens only), is one component of a comprehensive MRSA colonization surveillance program. It is not intended to diagnose MRSA infection nor to guide or monitor treatment for MRSA infections. Test performance is not FDA approved in patients less than 42 years old. Performed at [facility], [address] [place], [place] [address]          Radiology Studies: DG CHEST PORT 1 VIEW Result Date: 07/29/2024 CLINICAL DATA:  Hypoxia.  Found down and unresponsive. EXAM: PORTABLE CHEST 1 VIEW COMPARISON:  07/26/2024 FINDINGS: Stable mildly enlarged cardiac silhouette, accentuated by poor inspiration in the portable AP technique. Mild left lower lobe atelectasis. The remainder of the lungs are clear with normal vascularity. Interval right PICC with its tip at the superior cavoatrial junction. Unremarkable bones. No fracture or pneumothorax seen. IMPRESSION: 1. Mild left lower lobe atelectasis. 2. Stable mild cardiomegaly. Electronically Signed   By: Elspeth Bathe M.D.   On: 07/29/2024 11:51  Scheduled Meds:  Chlorhexidine  Gluconate Cloth  6 each Topical Daily   docusate  100 mg Oral BID   feeding supplement  237 mL Oral BID BM    insulin  aspart  2-6 Units Subcutaneous Q4H   insulin  glargine-yfgn  5 Units Subcutaneous BID   metoprolol  tartrate  25 mg Oral BID   pneumococcal 20-valent conjugate vaccine  0.5 mL Intramuscular Tomorrow-1000   polyethylene glycol  17 g Oral Daily   sodium chloride  flush  10-40 mL Intracatheter Q12H   Continuous Infusions:  cefTRIAXone  (ROCEPHIN )  IV Stopped (07/29/24 1031)   heparin  1,300 Units/hr (07/30/24 0009)   insulin  Stopped (07/28/24 1302)     LOS: 10 days    Time spent: 35 Minutes    Ellard Nan A Danta Baumgardner, MD [facility]   If 7PM-7AM, please contact night-coverage [email or web address]  07/30/2024, 8:08 AM

## 2024-07-31 ENCOUNTER — Other Ambulatory Visit (HOSPITAL_COMMUNITY): Payer: Self-pay

## 2024-07-31 ENCOUNTER — Telehealth (HOSPITAL_COMMUNITY): Payer: Self-pay | Admitting: Pharmacy Technician

## 2024-07-31 LAB — GLUCOSE, CAPILLARY
Glucose-Capillary: 134 mg/dL — ABNORMAL HIGH (ref 70–99)
Glucose-Capillary: 139 mg/dL — ABNORMAL HIGH (ref 70–99)
Glucose-Capillary: 149 mg/dL — ABNORMAL HIGH (ref 70–99)
Glucose-Capillary: 151 mg/dL — ABNORMAL HIGH (ref 70–99)
Glucose-Capillary: 174 mg/dL — ABNORMAL HIGH (ref 70–99)
Glucose-Capillary: 179 mg/dL — ABNORMAL HIGH (ref 70–99)

## 2024-07-31 LAB — CBC
HCT: 32.6 % — ABNORMAL LOW (ref 39.0–52.0)
Hemoglobin: 10.9 g/dL — ABNORMAL LOW (ref 13.0–17.0)
MCH: 31.4 pg (ref 26.0–34.0)
MCHC: 33.4 g/dL (ref 30.0–36.0)
MCV: 93.9 fL (ref 80.0–100.0)
Platelets: 200 K/uL (ref 150–400)
RBC: 3.47 MIL/uL — ABNORMAL LOW (ref 4.22–5.81)
RDW: 13.2 % (ref 11.5–15.5)
WBC: 6.2 K/uL (ref 4.0–10.5)
nRBC: 0.5 % — ABNORMAL HIGH (ref 0.0–0.2)

## 2024-07-31 LAB — BASIC METABOLIC PANEL WITH GFR
Anion gap: 14 (ref 5–15)
BUN: 7 mg/dL (ref 6–20)
CO2: 23 mmol/L (ref 22–32)
Calcium: 7.6 mg/dL — ABNORMAL LOW (ref 8.9–10.3)
Chloride: 104 mmol/L (ref 98–111)
Creatinine, Ser: 0.69 mg/dL (ref 0.61–1.24)
GFR, Estimated: >60 mL/min (ref >60)
Glucose, Bld: 139 mg/dL — ABNORMAL HIGH (ref 70–99)
Potassium: 3 mmol/L — ABNORMAL LOW (ref 3.5–5.1)
Sodium: 142 mmol/L (ref 135–145)

## 2024-07-31 LAB — HEPARIN LEVEL (UNFRACTIONATED): Heparin Unfractionated: 0.32 [IU]/mL (ref 0.30–0.70)

## 2024-07-31 LAB — PHOSPHORUS: Phosphorus: 2.6 mg/dL (ref 2.5–4.6)

## 2024-07-31 MED ORDER — APIXABAN 5 MG PO TABS
10.0000 mg | ORAL_TABLET | Freq: Two times a day (BID) | ORAL | Status: AC
  Administered 2024-07-31 – 2024-08-06 (×14): 10 mg via ORAL
  Filled 2024-07-31 (×14): qty 2

## 2024-07-31 MED ORDER — POTASSIUM CHLORIDE 20 MEQ PO PACK
40.0000 meq | PACK | Freq: Once | ORAL | Status: AC
  Administered 2024-07-31: 09:00:00 40 meq via ORAL
  Filled 2024-07-31: qty 2

## 2024-07-31 MED ORDER — APIXABAN 5 MG PO TABS
5.0000 mg | ORAL_TABLET | Freq: Two times a day (BID) | ORAL | Status: DC
  Administered 2024-08-07 – 2024-08-14 (×15): 5 mg via ORAL
  Filled 2024-07-31 (×14): qty 1

## 2024-07-31 MED ORDER — POTASSIUM CHLORIDE CRYS ER 20 MEQ PO TBCR: 40.0000 meq | EXTENDED_RELEASE_TABLET | Freq: Once | ORAL | Status: DC

## 2024-07-31 MED ORDER — INSULIN ASPART 100 UNIT/ML IJ SOLN
0.0000 [IU] | Freq: Three times a day (TID) | INTRAMUSCULAR | Status: DC
  Administered 2024-07-31: 18:00:00 2 [IU] via SUBCUTANEOUS
  Administered 2024-08-01: 08:00:00 5 [IU] via SUBCUTANEOUS
  Administered 2024-08-01: 17:00:00 3 [IU] via SUBCUTANEOUS
  Administered 2024-08-01 – 2024-08-02 (×3): 5 [IU] via SUBCUTANEOUS
  Administered 2024-08-02 – 2024-08-03 (×2): 2 [IU] via SUBCUTANEOUS
  Administered 2024-08-03: 17:00:00 1 [IU] via SUBCUTANEOUS
  Administered 2024-08-03: 09:00:00 3 [IU] via SUBCUTANEOUS
  Administered 2024-08-04: 2 [IU] via SUBCUTANEOUS
  Administered 2024-08-04: 5 [IU] via SUBCUTANEOUS
  Administered 2024-08-05: 3 [IU] via SUBCUTANEOUS
  Administered 2024-08-05: 7 [IU] via SUBCUTANEOUS
  Administered 2024-08-05: 3 [IU] via SUBCUTANEOUS
  Administered 2024-08-06: 2 [IU] via SUBCUTANEOUS
  Administered 2024-08-06: 3 [IU] via SUBCUTANEOUS
  Administered 2024-08-07: 2 [IU] via SUBCUTANEOUS
  Administered 2024-08-07: 1 [IU] via SUBCUTANEOUS
  Administered 2024-08-07 – 2024-08-09 (×3): 2 [IU] via SUBCUTANEOUS
  Administered 2024-08-09: 1 [IU] via SUBCUTANEOUS
  Administered 2024-08-10 (×2): 2 [IU] via SUBCUTANEOUS
  Administered 2024-08-11: 3 [IU] via SUBCUTANEOUS
  Administered 2024-08-12: 2 [IU] via SUBCUTANEOUS
  Administered 2024-08-13: 5 [IU] via SUBCUTANEOUS
  Administered 2024-08-14: 2 [IU] via SUBCUTANEOUS
  Filled 2024-07-31: qty 5
  Filled 2024-07-31 (×2): qty 3
  Filled 2024-07-31 (×2): qty 2
  Filled 2024-07-31: qty 9
  Filled 2024-07-31 (×2): qty 2
  Filled 2024-07-31 (×2): qty 1
  Filled 2024-07-31: qty 2
  Filled 2024-07-31: qty 3
  Filled 2024-07-31: qty 4
  Filled 2024-07-31 (×3): qty 2
  Filled 2024-07-31: qty 3
  Filled 2024-07-31: qty 5
  Filled 2024-07-31: qty 2
  Filled 2024-07-31: qty 1
  Filled 2024-07-31 (×2): qty 5
  Filled 2024-07-31: qty 3
  Filled 2024-07-31: qty 7
  Filled 2024-07-31: qty 5

## 2024-07-31 NOTE — Progress Notes (Signed)
 Inpatient Rehab Coordinator Note:  I spoke with patient and his spouse over the phone to discuss CIR recommendations and goals/expectations of CIR stay.  We reviewed 3 hrs/day of therapy, physician follow up, and average length of stay 2 weeks (dependent upon progress) with goals of supervision to mod I.  Spouse has arranged for 24/7 supervision at discharge, if needed, between herself and her mom.  Pt confirms [employer] as payor and I will start insurance auth request once I have updated therapy notes today (PT and OT both planning to see).    Reche Lowers, PT, DPT Admissions Coordinator [phone] 07/31/2024 11:47 AM

## 2024-07-31 NOTE — Discharge Instructions (Signed)
 Information on my medicine - ELIQUIS  (apixaban )  This medication education was reviewed with me or my healthcare representative as part of my discharge preparation.    Why was Eliquis  prescribed for you? Eliquis  was prescribed to treat blood clots that may have been found in the veins of your legs (deep vein thrombosis) or in your lungs (pulmonary embolism) and to reduce the risk of them occurring again.  What do You need to know about Eliquis  ? The starting dose is 10 mg (two 5 mg tablets) taken TWICE daily for the FIRST SEVEN (7) DAYS, then on 08/07/24  the dose is reduced to ONE 5 mg tablet taken TWICE daily.  Eliquis  may be taken with or without food.   Try to take the dose about the same time in the morning and in the evening. If you have difficulty swallowing the tablet whole please discuss with your pharmacist how to take the medication safely.  Take Eliquis  exactly as prescribed and DO NOT stop taking Eliquis  without talking to the doctor who prescribed the medication.  Stopping may increase your risk of developing a new blood clot.  Refill your prescription before you run out.  After discharge, you should have regular check-up appointments with your healthcare provider that is prescribing your Eliquis .    What do you do if you miss a dose? If a dose of ELIQUIS  is not taken at the scheduled time, take it as soon as possible on the same day and twice-daily administration should be resumed. The dose should not be doubled to make up for a missed dose.  Important Safety Information A possible side effect of Eliquis  is bleeding. You should call your healthcare provider right away if you experience any of the following: Bleeding from an injury or your nose that does not stop. Unusual colored urine (red or dark brown) or unusual colored stools (red or black). Unusual bruising for unknown reasons. A serious fall or if you hit your head (even if there is no bleeding).  Some  medicines may interact with Eliquis  and might increase your risk of bleeding or clotting while on Eliquis . To help avoid this, consult your healthcare provider or pharmacist prior to using any new prescription or non-prescription medications, including herbals, vitamins, non-steroidal anti-inflammatory drugs (NSAIDs) and supplements.  This website has more information on Eliquis  (apixaban ): [email or web address] .[email or web address] dena

## 2024-07-31 NOTE — Progress Notes (Signed)
 Physical Therapy Treatment Patient Details Name: JOHNNEY SCARLATA MRN: [id number] DOB: [birth year] Today's Date: 07/31/2024   History of Present Illness Jermal Dismuke is a 58 y/o M who presents for DKA likely in the setting of rhinovirus URTI, wife found on bathroom floor unresponsive, required invasive mechanical ventilation for metabolic encephalopathy.  12/10 pt with significant desaturation into 60's when up on BSC and needed Bipap and found to have B PE and underwent IR thrombectomy. PMH significant for type I DM, HTN, hyperthyroidism    PT Comments  The patient is much more alert and able to  progress ambulation this visit x 20' using RW.   Patient was noted to be sweaty after  toileting on the [facility] then stood at sink for  ~ 2 minutes.  BP on LLE 170/102, HR max 116. Patient then ambulated x 20' and was not sweaty.  Patient will benefit from intensive inpatient follow-up therapy, >3 hours/day       If plan is discharge home, recommend the following: A lot of help with walking and/or transfers;A lot of help with bathing/dressing/bathroom;Assistance with cooking/housework;Help with stairs or ramp for entrance;Assist for transportation;Direct supervision/assist for financial management   Can travel by private vehicle        Equipment Recommendations   (TBD)    Recommendations for Other Services Rehab consult     Precautions / Restrictions Precautions Precautions: Fall Precaution/Restrictions Comments: watch HR and BP     Mobility  Bed Mobility               General bed mobility comments: on BSC    Transfers Overall transfer level: Needs assistance Equipment used: Rolling walker (2 wheels) Transfers: Sit to/from Stand, Bed to chair/wheelchair/BSC Sit to Stand: Min assist   Step pivot transfers: Min assist       General transfer comment: able to power up from [facility] and recliner, cues for hand placement.    Ambulation/Gait Ambulation/Gait assistance: Min  assist, +2 safety/equipment Gait Distance (Feet):  (Then 20) Assistive device: Rolling walker (2 wheels) Gait Pattern/deviations: Step-to pattern, Trunk flexed Gait velocity: decreased     General Gait Details: slow to progress using RW, step at a time. cues for posture, sequencing.   Stairs             Wheelchair Mobility     Tilt Bed    Modified Rankin (Stroke Patients Only)       Balance Overall balance assessment: Needs assistance Sitting-balance support: No upper extremity supported, Feet supported Sitting balance-Leahy Scale: Fair     Standing balance support: Bilateral upper extremity supported, During functional activity, Reliant on assistive device for balance Standing balance-Leahy Scale: Fair Standing balance comment: standing at sink to wash hands with min/CGA                            Communication Communication Communication: No apparent difficulties  Cognition Arousal: Alert Behavior During Therapy: Flat affect   PT - Cognitive impairments: Problem solving, Initiation                       PT - Cognition Comments: slow processing Following commands: Impaired Following commands impaired: Follows one step commands with increased time    Cueing Cueing Techniques: Verbal cues, Gestural cues, Tactile cues  Exercises      General Comments        Pertinent Vitals/Pain Pain Assessment Faces Pain Scale: No hurt  Home Living                          Prior Function            PT Goals (current goals can now be found in the care plan section) Progress towards PT goals: Progressing toward goals    Frequency    Min 3X/week      PT Plan      Co-evaluation PT/OT/SLP Co-Evaluation/Treatment: Yes Reason for Co-Treatment: For patient/therapist safety PT goals addressed during session: Mobility/safety with mobility;Balance;Proper use of DME OT goals addressed during session: ADL's and self-care;Proper  use of Adaptive equipment and DME      AM-PAC PT 6 Clicks Mobility   Outcome Measure  Help needed turning from your back to your side while in a flat bed without using bedrails?: A Little Help needed moving from lying on your back to sitting on the side of a flat bed without using bedrails?: A Little Help needed moving to and from a bed to a chair (including a wheelchair)?: A Little Help needed standing up from a chair using your arms (e.g., wheelchair or bedside chair)?: A Little Help needed to walk in hospital room?: A Lot Help needed climbing 3-5 steps with a railing? : Total 6 Click Score: 15    End of Session Equipment Utilized During Treatment: Gait belt Activity Tolerance: Patient tolerated treatment well Patient left: in chair;with call bell/phone within reach;with family/visitor present;with chair alarm set Nurse Communication: Mobility status PT Visit Diagnosis: Unsteadiness on feet (R26.81);Muscle weakness (generalized) (M62.81);Difficulty in walking, not elsewhere classified (R26.2)     Time: 8794-8763 PT Time Calculation (min) (ACUTE ONLY): 31 min  Charges:    $Gait Training: 8-22 mins PT General Charges $$ ACUTE PT VISIT: 1 Visit                     [name] PT Acute Rehabilitation Services Office [phone]    [name] 07/31/2024, 1:09 PM

## 2024-07-31 NOTE — Progress Notes (Signed)
 Occupational Therapy Treatment Patient Details Name: Jorge Calhoun MRN: [id number] DOB: 1965/12/21 Today's Date: 07/31/2024   History of present illness Jorge Calhoun is a 58 y/o M who presents for DKA likely in the setting of rhinovirus URTI, wife found on bathroom floor unresponsive, required invasive mechanical ventilation for metabolic encephalopathy.  12/10 pt with significant desaturation into 60's when up on BSC and needed Bipap and found to have B PE and underwent IR thrombectomy. PMH significant for type I DM, HTN, hyperthyroidism   OT comments  Patient seen for skilled OT session this am. Patient is making significant gains in function and was up on [facility] upon therapy arrival. Progressed standing sink side and short amb with chair follow and assist. Processing and overall activity tolerance progressing and was left on RA with all VSS. Family present for session and extremely supportive. Patient will benefit from intensive inpatient follow-up therapy, >3 hours/day. Patient requires continued Acute care hospital level OT services to progress safety and functional performance and allow for discharge.        If plan is discharge home, recommend the following:  A lot of help with walking and/or transfers;A lot of help with bathing/dressing/bathroom;Assistance with cooking/housework;Assist for transportation;Help with stairs or ramp for entrance;Supervision due to cognitive status   Equipment Recommendations  Wheelchair (measurements OT);Wheelchair cushion (measurements OT);BSC/3in1;Tub/shower bench    Recommendations for Other Services Rehab consult    Precautions / Restrictions Precautions Precautions: Fall Recall of Precautions/Restrictions: Impaired Precaution/Restrictions Comments: watch HR and BP Restrictions Weight Bearing Restrictions Per Provider Order: No       Mobility Bed Mobility               General bed mobility comments: was already up on [facility]     Transfers Overall transfer level: Needs assistance Equipment used: Rolling walker (2 wheels) Transfers: Sit to/from Stand, Bed to chair/wheelchair/BSC Sit to Stand: Min assist     Step pivot transfers: Min assist     General transfer comment: able to power up from [facility] and recliner, cues for hand placement.     Balance Overall balance assessment: Needs assistance Sitting-balance support: No upper extremity supported, Feet supported Sitting balance-Leahy Scale: Fair     Standing balance support: Bilateral upper extremity supported, During functional activity, Reliant on assistive device for balance Standing balance-Leahy Scale: Fair Standing balance comment: standing at sink to wash hands with min/CGA                           ADL either performed or assessed with clinical judgement   ADL Overall ADL's : Needs assistance/impaired Eating/Feeding: Set up;Sitting   Grooming: Wash/dry hands;Wash/dry face;Standing;Contact guard assist;Minimal assistance Grooming Details (indicate cue type and reason): stood for 2 minutes for simple grooming Upper Body Bathing: Sitting;Minimal assistance   Lower Body Bathing: Maximal assistance;Sit to/from stand   Upper Body Dressing : Moderate assistance;Sitting   Lower Body Dressing: Maximal assistance;Cueing for sequencing;Sit to/from stand   Toilet Transfer: Moderate assistance;Maximal assistance;Cueing for safety;Cueing for sequencing;BSC/3in1;Rolling walker (2 wheels) Toilet Transfer Details (indicate cue type and reason): use of BSC to have BM this session Toileting- Clothing Manipulation and Hygiene: Maximal assistance;Sitting/lateral lean       Functional mobility during ADLs: Minimal assistance;Rolling walker (2 wheels);Cueing for sequencing General ADL Comments: limited ability to reach to LE's, weakness persists but improving overall which limits tasks with cues for problem solving    Extremity/Trunk Assessment Upper  Extremity Assessment Upper  Extremity Assessment: Generalized weakness;Right hand dominant   Lower Extremity Assessment Lower Extremity Assessment: Defer to PT evaluation        Vision       Perception     Praxis     Communication Communication Communication: No apparent difficulties   Cognition Arousal: Alert Behavior During Therapy: Flat affect Cognition: Cognition impaired     Awareness: Online awareness impaired Memory impairment (select all impairments): Short-term memory Attention impairment (select first level of impairment): Selective attention Executive functioning impairment (select all impairments): Sequencing, Problem solving OT - Cognition Comments: significant gains in processing,initiation  attention and functional problem solving this session, cues for higher level sequencing                 Following commands: Impaired Following commands impaired: Follows one step commands with increased time      Cueing   Cueing Techniques: Verbal cues, Gestural cues, Tactile cues  Exercises      Shoulder Instructions       General Comments stood for 2 minutes, amb short distance with seated rest and assist, mild +1-2 B hand edema remains, left on RA post session    Pertinent Vitals/ Pain       Pain Assessment Pain Assessment: Faces Faces Pain Scale: No hurt   Frequency  Min 2X/week        Progress Toward Goals  OT Goals(current goals can now be found in the care plan section)  Progress towards OT goals: Progressing toward goals  Acute Rehab OT Goals Patient Stated Goal: to get to rehab soon OT Goal Formulation: With patient/family Time For Goal Achievement: 08/09/24 Potential to Achieve Goals: Good ADL Goals Pt Will Perform Eating: with set-up;sitting Pt Will Perform Grooming: with contact guard assist;sitting Pt Will Perform Upper Body Bathing: with min assist;sitting Pt Will Perform Upper Body Dressing: with min assist;sitting Pt Will  Transfer to Toilet: with +2 assist;stand pivot transfer;with min assist;bedside commode Pt/caregiver will Perform Home Exercise Program: Increased strength;Both right and left upper extremity;With written HEP provided;With Supervision  Plan      Co-evaluation      Reason for Co-Treatment: For patient/therapist safety PT goals addressed during session: Mobility/safety with mobility;Balance;Proper use of DME OT goals addressed during session: ADL's and self-care;Proper use of Adaptive equipment and DME      AM-PAC OT 6 Clicks Daily Activity     Outcome Measure   Help from another person eating meals?: A Little Help from another person taking care of personal grooming?: A Little Help from another person toileting, which includes using toliet, bedpan, or urinal?: A Lot Help from another person bathing (including washing, rinsing, drying)?: A Lot Help from another person to put on and taking off regular upper body clothing?: A Little Help from another person to put on and taking off regular lower body clothing?: A Lot 6 Click Score: 15    End of Session Equipment Utilized During Treatment: Gait belt;Rolling walker (2 wheels)  OT Visit Diagnosis: Unsteadiness on feet (R26.81);Muscle weakness (generalized) (M62.81);Cognitive communication deficit (R41.841)   Activity Tolerance Patient tolerated treatment well   Patient Left in chair;with call bell/phone within reach;with chair alarm set;with family/visitor present   Nurse Communication Mobility status;Other (comment) (BM info in flowsheets)        Time: 8841-8765 OT Time Calculation (min): 36 min  Charges: OT General Charges $OT Visit: 1 Visit OT Treatments $Self Care/Home Management : 8-22 mins  Devereaux Grayson OT/L Acute Rehabilitation Department  [phone]  07/31/2024, 1:52 PM

## 2024-07-31 NOTE — Telephone Encounter (Signed)
 Patient Product/process Development Scientist completed.    The patient is insured through [employer] [facility]. Patient has [employer], may use a copay card, and/or apply for patient assistance if available.    Ran test claim for Eliquis  Starter Pack and the current 30 day co-pay is $18.00.   This test claim was processed through [facility]- copay amounts may vary at other pharmacies due to pharmacy/plan contracts, or as the patient moves through the different stages of their insurance plan.     Reyes Sharps, CPHT Pharmacy Technician Patient Advocate Specialist Lead [facility] Patient Advocate Team Direct Number: [phone]  Fax: [phone]

## 2024-07-31 NOTE — TOC Progression Note (Signed)
 Transition of Care [facility]) - Progression Note    Patient Details  Name: Jorge Calhoun MRN: [id number] Date of Birth: 07-21-1966  [facility] CM/SW Contact  Novali Vollman, Nathanel, RN Phone Number: 07/31/2024, 12:16 PM  Clinical Narrative: Noted CIR rep Caitlin following-await outcome.      Expected Discharge Plan: IP Rehab Facility Barriers to Discharge: Continued Medical Work up               Expected Discharge Plan and Services   Discharge Planning Services: CM Consult Post Acute Care Choice: NA Living arrangements for the past 2 months: Single Family Home                                       Social Drivers of Health (SDOH) Interventions SDOH Screenings   Food Insecurity: No Food Insecurity (07/22/2024)  Housing: Low Risk (07/22/2024)  Transportation Needs: Patient Unable To Answer (07/22/2024)  Utilities: Patient Unable To Answer (07/22/2024)  Tobacco Use: Low Risk (07/20/2024)    Readmission Risk Interventions    07/24/2024    8:55 AM  Readmission Risk Prevention Plan  Post Dischage Appt Complete  Medication Screening Complete  Transportation Screening Complete

## 2024-07-31 NOTE — Progress Notes (Addendum)
 PROGRESS NOTE    Jorge Calhoun  FMW:[id number] DOB: 1965/08/19 DOA: 07/20/2024 PCP: Seabron Lenis, MD   Brief Narrative: 58 year old with past medical history significant for hypertension, nontoxic multinodular goiter, obesity, PSVT, type 1 diabetes who was found down unresponsive at home on 12/4.  He presented to the [facility] 12/3 after 3 to 4 days of feeling generally unwell, poor oral intake.  Per wife he received something for an infection.  On the day of admission he was found unresponsive at home.  On EMS arrival he was hypotensive systolic blood pressure in the 80s oxygen saturation 100% on 2 L CBG 400.  He was found to be in DKA, hyperglycemia, received fluids and insulin  drip.  He was found to have rhinovirus positive.  He was intubated for airway protection in the setting of altered mental status.  Hospital course: 12/4 Admit in DKA, RVP panel positive for rhinovirus/enterovirus 12/5 Progressive decline in mentation with eventual development of unresponsiveness intubated for airway protection with central line and A-line placed as well 12/9, extubated, aline out 12/10 was doing well pending tx out of ICU> acute SOB when OOB> massive bilateral PE with RV dysfunction/ shock> IR thrombectomy, heparin , NE 12/11 - 12/12 --> euglycemic DKA on insulin  drip, levo weaned off  Assessment & Plan:   Principal Problem:   DKA (diabetic ketoacidosis) (HCC) Active Problems:   DKA, type 1 (HCC)   Acute saddle pulmonary embolism with acute cor pulmonale (HCC)   1-Acute hypoxic respiratory failure -Massive bilateral PE with cor pulmonale status post IR thrombectomy 12/10 -Obstructive shock secondary to PE: Resolved - Patient initially intubated 12/5 for airway protection.  Subsequently extubated 12/9. - Patient developed acute shortness of breath CT angio showed massive bilateral PE with RV dysfunction and shock, IR performed thrombectomy. -Plan to transition to Eliquis  today from heparin  gtt.     MSSA pneumonia Rhinovirus infection 1 out of 4 positive for strep Viridians;  Plan to treat for  10 of antibiotics Repeated blood cultures 12/13 no growth today.  AKI In the setting of hypotension and IV contrast, diuretics.  Resolved Anion gap metabolic acidosis: Resolved  Hypernatremia: Improving Encourage oral intake  Hypokalemia Replace orally  DKA type 1 diabetes Received insulin  drip Beta hydroxybutyric acid ending down. Continue Lantus  twice daily, continue sliding scale insulin  Change SSI to ACHS.   Hypertension Continue with metoprolol  twice daily  Hyperlipidemia   Hypophosphatemia: Replaced.    See wound care documentation below  Wound 07/27/24 2100 Pressure Injury Heel Right Deep Tissue Pressure Injury - Purple or maroon localized area of discolored intact skin or blood-filled blister due to damage of underlying soft tissue from pressure and/or shear. (Active)     Nutrition Problem: Inadequate oral intake Etiology: inability to eat    Signs/Symptoms: NPO status    Interventions: Refer to RD note for recommendations, Tube feeding  Estimated body mass index is 31.73 kg/m as calculated from the following:   Height as of this encounter: 5' 11 (1.803 m).   Weight as of this encounter: 103.2 kg.   DVT prophylaxis: Heparin  drip Code Status: Full code Family Communication: Wife updated at bedside Disposition Plan:  Status is: Inpatient Remains inpatient appropriate because: stable for CIR, patient will benefit from CIR admission.     Consultants:  Pulmonologist  Procedures:  Thrombectomy  Antimicrobials:  IV ceftriaxone   Subjective: He is alert, denies pain, eating more.   Objective: Vitals:   07/30/24 2100 07/31/24 0409 07/31/24 0619 07/31/24 0900  BP:  121/75  (!) 149/79  Pulse:  96 92 93  Resp:      Temp:  99.4 F (37.4 C)    TempSrc:  Oral    SpO2: 97% 98% 95%   Weight:  103.2 kg    Height:        Intake/Output  Summary (Last 24 hours) at 07/31/2024 1247 Last data filed at 07/31/2024 1243 Gross per 24 hour  Intake 1222.29 ml  Output 3500 ml  Net -2277.71 ml   Filed Weights   07/27/24 0423 07/30/24 0323 07/31/24 0409  Weight: 91.4 kg 106.9 kg 103.2 kg    Examination:  General exam: NAD Respiratory system: CTA Cardiovascular system: S 1, S 2 RRR Gastrointestinal system: BS present, soft, t Central nervous system: Alert, conversant, follows command Extremities: Symmetric 5 x 5 power.   Data Reviewed: I have personally reviewed following labs and imaging studies  CBC: Recent Labs  Lab 07/27/24 0449 07/27/24 0807 07/28/24 0546 07/29/24 0536 07/30/24 0153 07/31/24 0155  WBC 13.6*  --  6.7 5.7 6.1 6.2  HGB 12.5* 12.2* 10.9* 10.8* 10.8* 10.9*  HCT 39.9 36.0* 34.0* 33.5* 32.1* 32.6*  MCV 100.0  --  97.4 97.4 93.9 93.9  PLT 196  --  203 215 234 200   Basic Metabolic Panel: Recent Labs  Lab 07/25/24 0500 07/25/24 1736 07/26/24 0307 07/26/24 1704 07/27/24 0449 07/27/24 0751 07/28/24 0546 07/28/24 0730 07/28/24 1550 07/28/24 1640 07/29/24 0536 07/29/24 1005 07/29/24 1650 07/30/24 0153 07/30/24 0820 07/31/24 0155  NA 152*   < > 149*   < > 150*   < >  --    < > 151*  --   --  151* 145 146*  --  142  K 3.5   < > 4.0   < > 4.6   < >  --    < > 3.4*  --   --  3.4* 3.6 3.4*  --  3.0*  CL 113*   < > 108  --  109   < >  --    < > 115*  --   --  114* 110 109  --  104  CO2 32   < > 23  --  9*   < >  --    < > 26  --   --  21* 18* 21*  --  23  GLUCOSE 220*   < > 165*  --  238*   < >  --    < > 121*  --   --  141* 226* 132*  --  139*  BUN 19   < > 22*  --  32*   < >  --    < > 17  --   --  14 14 11   --  7  CREATININE 0.96   < > 0.90  --  1.28*   < >  --    < > 0.89  --   --  0.82 0.81 0.80  --  0.69  CALCIUM  8.4*   < > 8.6*  --  8.1*   < >  --    < > 7.6*  --   --  7.5* 7.3* 8.0*  --  7.6*  MG 2.7*  --   --   --  2.6*  --  2.4  --   --  2.5* 2.2  --   --   --   --   --   PHOS 2.1*   --  2.8  --   --   --   --   --   --   --  1.9*  --   --   --  2.1* 2.6   < > = values in this interval not displayed.   GFR: Estimated Creatinine Clearance: 121.6 mL/min (by C-G formula based on SCr of 0.69 mg/dL). Liver Function Tests: Recent Labs  Lab 07/27/24 0751  AST 28  ALT 26  ALKPHOS 71  BILITOT 0.3  PROT 5.2*  ALBUMIN 3.0*   No results for input(s): LIPASE, AMYLASE in the last 168 hours. No results for input(s): AMMONIA in the last 168 hours. Coagulation Profile: Recent Labs  Lab 07/26/24 1902  INR 1.2   Cardiac Enzymes: No results for input(s): CKTOTAL, CKMB, CKMBINDEX, TROPONINI in the last 168 hours. BNP (last 3 results) Recent Labs    07/26/24 1902  PROBNP 605.0*   HbA1C: No results for input(s): HGBA1C in the last 72 hours. CBG: Recent Labs  Lab 07/30/24 2009 07/31/24 0003 07/31/24 0410 07/31/24 0738 07/31/24 1148  GLUCAP 130* 134* 139* 149* 174*   Lipid Profile: No results for input(s): CHOL, HDL, LDLCALC, TRIG, CHOLHDL, LDLDIRECT in the last 72 hours. Thyroid  Function Tests: No results for input(s): TSH, T4TOTAL, FREET4, T3FREE, THYROIDAB in the last 72 hours. Anemia Panel: No results for input(s): VITAMINB12, FOLATE, FERRITIN, TIBC, IRON, RETICCTPCT in the last 72 hours. Sepsis Labs: Recent Labs  Lab 07/26/24 1903 07/27/24 0753  LATICACIDVEN 1.4 1.8    Recent Results (from the past 240 hours)  Culture, blood (Routine X 2) w Reflex to ID Panel     Status: None   Collection Time: 07/22/24  7:25 PM   Specimen: BLOOD RIGHT ARM  Result Value Ref Range Status   Specimen Description   Final    BLOOD RIGHT ARM Performed at [facility], [address] [place], [place] [address]    Special Requests   Final    BOTTLES DRAWN AEROBIC ONLY Blood Culture adequate volume Performed at [facility], [address], [place], [place] [address]    Culture   Final    NO  GROWTH 5 DAYS Performed at [facility], [address] [place], [place] [address]    Report Status 07/27/2024 FINAL  Final  Culture, blood (Routine X 2) w Reflex to ID Panel     Status: None   Collection Time: 07/22/24  7:25 PM   Specimen: BLOOD RIGHT ARM  Result Value Ref Range Status   Specimen Description   Final    BLOOD RIGHT ARM Performed at [facility], [address], [place], [place] [address]    Special Requests   Final    BOTTLES DRAWN AEROBIC ONLY Blood Culture adequate volume Performed at [facility], [address] [place], [place] [address]    Culture   Final    NO GROWTH 5 DAYS Performed at [facility], [address], [place], [place] [address]    Report Status 07/27/2024 FINAL  Final  MRSA Next Gen by PCR, Nasal     Status: None   Collection Time: 07/24/24  9:45 AM   Specimen: Nasal Mucosa; Nasal Swab  Result Value Ref Range Status   MRSA by PCR Next Gen NOT DETECTED NOT DETECTED Final    Comment: (NOTE) The GeneXpert MRSA Assay (FDA approved for NASAL specimens only), is one component of a comprehensive MRSA colonization surveillance program.  It is not intended to diagnose MRSA infection nor to guide or monitor treatment for MRSA infections. Test performance is not FDA approved in patients less than 5 years old. Performed at [facility], [address], [place], [place] [address]   Culture, blood (Routine X 2) w Reflex to ID Panel     Status: None (Preliminary result)   Collection Time: 07/29/24 12:37 PM   Specimen: BLOOD LEFT ARM  Result Value Ref Range Status   Specimen Description   Final    BLOOD LEFT ARM Performed at [facility], [address] [place], [place] [address]    Special Requests   Final    BOTTLES DRAWN AEROBIC ONLY Blood Culture adequate volume Performed at [facility], [address], [place], [place] [address]    Culture   Final    NO  GROWTH 2 DAYS Performed at [facility], [address], [place], [place] [address]    Report Status PENDING  Incomplete  Culture, blood (Routine X 2) w Reflex to ID Panel     Status: None (Preliminary result)   Collection Time: 07/29/24 12:48 PM   Specimen: BLOOD LEFT ARM  Result Value Ref Range Status   Specimen Description   Final    BLOOD LEFT ARM Performed at [facility], [address], [place], [place] [address]    Special Requests   Final    BOTTLES DRAWN AEROBIC ONLY Blood Culture results may not be optimal due to an inadequate volume of blood received in culture bottles Performed at [facility], [address] [place], [place] [address]    Culture   Final    NO GROWTH 2 DAYS Performed at [facility], [address] [place], [place] [address]    Report Status PENDING  Incomplete         Radiology Studies: No results found.       Scheduled Meds:  apixaban   10 mg Oral BID   Followed by   NOREEN ON 08/07/2024] apixaban   5 mg Oral BID   Chlorhexidine  Gluconate Cloth  6 each Topical Daily   docusate  100 mg Oral BID   feeding supplement  237 mL Oral BID BM   insulin  aspart  2-6 Units Subcutaneous Q4H   insulin  glargine-yfgn  5 Units Subcutaneous BID   metoprolol  tartrate  25 mg Oral BID   pneumococcal 20-valent conjugate vaccine  0.5 mL Intramuscular Tomorrow-1000   polyethylene glycol  17 g Oral Daily   sodium chloride  flush  10-40 mL Intracatheter Q12H   Continuous Infusions:     LOS: 11 days    Time spent: 35 Minutes    Jemarion Roycroft A Garett Tetzloff, MD [facility]   If 7PM-7AM, please contact night-coverage [email or web address]  07/31/2024, 12:47 PM

## 2024-07-31 NOTE — Inpatient Diabetes Management (Addendum)
 Inpatient Diabetes Program Recommendations  AACE/ADA: New Consensus Statement on Inpatient Glycemic Control (2015)  Target Ranges:  Prepandial:   less than 140 mg/dL      Peak postprandial:   less than 180 mg/dL (1-2 hours)      Critically ill patients:  140 - 180 mg/dL   Lab Results  Component Value Date   GLUCAP 174 (H) 07/31/2024   HGBA1C 8.8 (H) 07/21/2024    Review of Glycemic Control  Latest Reference Range & Units 07/30/24 20:09 07/31/24 00:03 07/31/24 04:10 07/31/24 07:38 07/31/24 11:48  Glucose-Capillary 70 - 99 mg/dL 869 (H) 865 (H) 860 (H) 149 (H) 174 (H)  (H): Data is abnormally high  Diabetes history: DM1(does not make insulin .  Needs correction, basal and meal coverage)  Outpatient Diabetes medications: Tresiba 25 units every day, Humalog 30 unit TID, Jardiance  25 every day, Glipizide QD Current orders for Inpatient glycemic control: Semglee  5 units BID, Novolog  2-6 units Q4H  Met with patient at bedside.  He tells me he was diagnosed with T1D at age 84.  He used to see  endocrinologist, Dr. Faythe.  Last visit was in May 2025.  [facility] has since been going the the [place].  [facility].  PCP added Glipizide and Jardiance .  If he truly has T1DM glipizide will not help him.  He was started on Jardiance  in November and things have gone downhill since then.  Jardiance  was started back inpatient and he had to go back on the insulin  drip.  MD has put Jardiance  as an intolerance on his chart.    Encouraged him to reestablish with endocrinology.   Reviewed hypoglycemia, < 70 mg/dL, signs, symptoms and treatments.    Thank you, Wyvonna Pinal, MSN, CDCES Diabetes Coordinator Inpatient Diabetes Program [phone] (team pager from 8a-5p)

## 2024-07-31 NOTE — PMR Pre-admission (Shared)
 PMR Admission Coordinator Pre-Admission Assessment  Patient: Jorge Calhoun is an 58 y.o., male MRN: [id number] DOB: [birth year] Height: 5' 11 (180.3 cm) Weight: 103.2 kg  Insurance Information HMO: ***    PPO: ***     PCP:      IPA:      80/20:      OTHER:  PRIMARY: [employer]      Policy#: [id number]       Subscriber: pt CM Name: ***      Phone#: ***     Fax#: *** Pre-Cert#: ***      Employer: *** Benefits:  Phone #: ***     Name: *** Eustacio. Date: ***     Deduct: ***      Out of Pocket Max: ***      Life Max: *** CIR: ***      SNF: *** Outpatient: ***     Co-Pay: *** Home Health: ***      Co-Pay: *** DME: ***     Co-Pay: *** Providers:  SECONDARY:       Policy#:      Phone#:   Financial Counselor:       Phone#:   The Best Boy for patients in Inpatient Rehabilitation Facilities with attached Privacy Act Statement-Health Care Records was provided and verbally reviewed with: Patient and Family  Emergency Contact Information Contact Information     Name Relation Home Work Mobile   [name] Spouse [phone]        Other Contacts   None on File     Current Medical History  Patient Admitting Diagnosis: debility, encephalopathy, respiratory failure   History of Present Illness: Pt is a 58 y/o male with PMH of DM, HTN, and hyperthyroidism who was admitted to [facility] on 12/4 after being found unresponsive at home.  In ED, CO2 <7, glucose 1045, BUN 56, creatinine 3.82, WBC 22.3.   RVP panel positive for rhinovirus/enterovirus.  He had a decline in respiratory status and was intubated for airway protection on 12/5.  Extubated on 12/9 but still with high O2 requirements, slow progression from bipap to HHFNC to [place]uncey.  Currently requiring 3L via nasal cannula compared to baseline of no supplemental O2.  On 12/10 he developed acute SOB when out of bed, imaging revealed a massive bilateral PE with RV dysfunction and cardiogenic shock.  He  underwent thrombectomy per IR and started on heparin .  He did require levophed  for shock as well as insulin  drip for glucose management.  He is currently receiving IV abx.  Therapy ongoing and recommendations are for CIR.     Patient's medical record from [facility] has been reviewed by the rehabilitation admission coordinator and physician.  Past Medical History  Past Medical History:  Diagnosis Date   Hypertension    Nontoxic multinodular goiter    Obesity, unspecified    Plantar fasciitis    PSVT (paroxysmal supraventricular tachycardia)    noted on heart monitor 02/2020   Type I (juvenile type) diabetes mellitus without mention of complication, not stated as uncontrolled     Has the patient had major surgery during 100 days prior to admission? Yes  Family History   family history includes Cancer - Ovarian in his maternal grandmother; Diabetes in his father and paternal grandmother; Hypertension in his father and mother.  Current Medications Current Medications[1]  Patients Current Diet:  Diet Order             Diet heart  healthy/carb modified Room service appropriate? Yes; Fluid consistency: Thin  Diet effective now                   Precautions / Restrictions Precautions Precautions: Fall Precaution/Restrictions Comments: watch HR Restrictions Weight Bearing Restrictions Per Provider Order: No   Has the patient had 2 or more falls or a fall with injury in the past year? 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 Independent  Indoor Mobility: Did the patient need assistance with walking from room to room (with or without device)? Independent  Stairs: Did the patient need assistance with internal or external stairs (with or without device)? Independent  Functional  Cognition: Did the patient need help planning regular tasks such as shopping or remembering to take medications? Independent  Patient Information Are you of Hispanic, Latino/a,or Spanish origin?: A. No, not of Hispanic, Latino/a, or Spanish origin What is your race?: B. Black or African American Do you need or want an interpreter to communicate with a doctor or health care staff?: 0. No  Patient's Response To:  Health Literacy and Transportation Is the patient able to respond to health literacy and transportation needs?: Yes Health Literacy - How often do you need to have someone help you when you read instructions, pamphlets, or other written material from your doctor or pharmacy?: Never In the past 12 months, has lack of transportation kept you from medical appointments or from getting medications?: No In the past 12 months, has lack of transportation kept you from meetings, work, or from getting things needed for daily living?: No  Home Assistive Devices / Equipment Home Equipment: None  Prior Device Use: Indicate devices/aids used by the patient prior to current illness, exacerbation or injury? None of the above  Current Functional Level Cognition  Orientation Level: Oriented X4    Extremity Assessment (includes Sensation/Coordination)  Upper Extremity Assessment: Generalized weakness  Lower Extremity Assessment: Generalized weakness    ADLs  Overall ADL's : Needs assistance/impaired Eating/Feeding: Moderate assistance, Sitting Eating/Feeding Details (indicate cue type and reason): fatigues easily Grooming: Wash/dry hands, Wash/dry face, Moderate assistance, Cueing for sequencing, Sitting Upper Body Bathing: Maximal assistance, Sitting Lower Body Bathing: Total assistance, Bed level Upper Body Dressing : Maximal assistance, Sitting, Cueing for sequencing Lower Body Dressing: Total assistance, Bed level Toilet Transfer:  (deferred to recliner in ICU) Toileting- Clothing  Manipulation and Hygiene: Maximal assistance, Sitting/lateral lean Toileting - Clothing Manipulation Details (indicate cue type and reason): Foley out, set patient up with wife or nurse to assist with urinal once voids Functional mobility during ADLs: Moderate assistance, +2 for physical assistance, +2 for safety/equipment, Rolling walker (2 wheels) General ADL Comments: unable to reach to LE's, weakness in general limits tasks with cues for processing    Mobility  Overal bed mobility: Needs Assistance Bed Mobility: Rolling, Sidelying to Sit Rolling: Mod assist Sidelying to sit: Mod assist, +2 for physical assistance, Max assist Supine to sit: Mod assist, HOB elevated, Used rails General bed mobility comments: assist for legs off EOB and to lift trunk    Transfers  Overall transfer level: Needs assistance Equipment used: Rolling walker (2 wheels) Transfers: Sit to/from Stand, Bed to chair/wheelchair/BSC Sit to Stand: Mod assist, +2 physical assistance, +2 safety/equipment, From elevated surface Bed to/from chair/wheelchair/BSC transfer type:: Step pivot Step pivot transfers: Mod  assist, +2 physical assistance, +2 safety/equipment, From elevated surface General transfer comment: stood to RW with increased time and some lifting help; able to step very slowly small steps with RW to get to recliner    Ambulation / Gait / Stairs / Engineer, Drilling / Balance Dynamic Sitting Balance Sitting balance - Comments: with UE support of rail Balance Overall balance assessment: Needs assistance Sitting-balance support: Single extremity supported, Feet supported Sitting balance-Leahy Scale: Fair Sitting balance - Comments: with UE support of rail Postural control: Right lateral lean, Left lateral lean Standing balance support: Bilateral upper extremity supported, During functional activity, Reliant on assistive device for balance Standing balance-Leahy Scale: Poor Standing balance  comment: signifianct forward trunk during transfer with cues for upright    Special considerations/life events  Continuous Drip IV  rocephin , Oxygen 3L, and Diabetic management yes   Previous Home Environment (from acute therapy documentation) Living Arrangements: Spouse/significant other, Children, Other relatives Available Help at Discharge: Family Type of Home: House Home Layout: Two level, Bed/bath upstairs, Full bath on main level Alternate Level Stairs-Rails: Left Alternate Level Stairs-Number of Steps: flight Home Access: Level entry Bathroom Shower/Tub: Walk-in shower, Tub only Firefighter: Standard Home Care Services: No Additional Comments: Mechainic  Discharge Living Setting Plans for Discharge Living Setting: Patient's home, Lives with (comment) (spouse) Type of Home at Discharge: House Discharge Home Layout: Two level, Bed/bath upstairs Alternate Level Stairs-Rails: Left Alternate Level Stairs-Number of Steps: full flight Discharge Home Access: Level entry Discharge Bathroom Shower/Tub: Walk-in shower, Tub/shower unit Discharge Bathroom Toilet: Standard Discharge Bathroom Accessibility: Yes How Accessible: Accessible via walker Does the patient have any problems obtaining your medications?: No  Social/Family/Support Systems Patient Roles: Spouse Anticipated Caregiver: Arland and her mother Anticipated Industrial/product Designer Information: Arland: [phone] Ability/Limitations of Caregiver: none stated Caregiver Availability: 24/7 Discharge Plan Discussed with Primary Caregiver: Yes Is Caregiver In Agreement with Plan?: Yes  Goals Patient/Family Goal for Rehab: PT/OT supervision to mod I, SLP n/a Expected length of stay: 10-12 days Additional Information: Discharge plan: home with 24/7 supervision from spouse and MIL to previous living environment Pt/Family Agrees to Admission and willing to participate: Yes Program Orientation Provided & Reviewed with  Pt/Caregiver Including Roles  & Responsibilities: Yes  Decrease burden of Care through IP rehab admission: n/a  Possible need for SNF placement upon discharge: No.  Plan for discharge home at supervision/mod I level with spouse and mother in law able to provide expected level of support.   Patient Condition: I have reviewed medical records from [facility], spoken with CM, and patient and spouse. I discussed via phone for inpatient rehabilitation assessment.  Patient will benefit from ongoing PT and OT, can actively participate in 3 hours of therapy a day 5 days of the week, and can make measurable gains during the admission.  Patient will also benefit from the coordinated team approach during an Inpatient Acute Rehabilitation admission.  The patient will receive intensive therapy as well as Rehabilitation physician, nursing, social worker, and care management interventions.  Due to safety, skin/wound care, disease management, medication administration, pain management, and patient education the patient requires 24 hour a day rehabilitation nursing.  The patient is currently min to mod assist with mobility and basic ADLs.  Discharge setting and therapy post discharge at home with home health is anticipated.  Patient has agreed to participate in the Acute Inpatient Rehabilitation Program and will admit ***.  Preadmission Screen Completed By:  Sary Bogie  FORBES Lowers, 07/31/2024 11:58 AM ______________________________________________________________________   Discussed status with Dr. PIERRETTE on *** at *** and received approval for admission today.  Admission Coordinator:  Conard Alvira E Shantoria Ellwood, PT, time PIERRETTEPattricia ***   Assessment/Plan: Diagnosis: *** Does the need for close, 24 hr/day Medical supervision in concert with the patient's rehab needs make it unreasonable for this patient to be served in a less intensive setting? {yes_no_potentially:3041433} Co-Morbidities requiring supervision/potential complications:  *** Due to {due un:6958565}, does the patient require 24 hr/day rehab nursing? {yes_no_potentially:3041433} Does the patient require coordinated care of a physician, rehab nurse, PT, OT, and SLP to address physical and functional deficits in the context of the above medical diagnosis(es)? {yes_no_potentially:3041433} Addressing deficits in the following areas: {deficits:3041436} Can the patient actively participate in an intensive therapy program of at least 3 hrs of therapy 5 days a week? {yes_no_potentially:3041433} The potential for patient to make measurable gains while on inpatient rehab is {potential:3041437} Anticipated functional outcomes upon discharge from inpatient rehab: {functional outcomes:304600100} PT, {functional outcomes:304600100} OT, {functional outcomes:304600100} SLP Estimated rehab length of stay to reach the above functional goals is: *** Anticipated discharge destination: {anticipated dc setting:21604} 10. Overall Rehab/Functional Prognosis: {potential:3041437}   MD Signature: ***    [1]  Current Facility-Administered Medications:    acetaminophen  (TYLENOL ) 160 MG/5ML solution 650 mg, 650 mg, Oral, Q6H PRN, Alghanim, Fahid, MD, 650 mg at 07/25/24 2047   apixaban  (ELIQUIS ) tablet 10 mg, 10 mg, Oral, BID, 10 mg at 07/31/24 0944 **FOLLOWED BY** [START ON 08/07/2024] apixaban  (ELIQUIS ) tablet 5 mg, 5 mg, Oral, BID, Pham, Anh P, RPH   Chlorhexidine  Gluconate Cloth 2 % PADS 6 each, 6 each, Topical, Daily, Claudene Toribio BROCKS, MD, 6 each at 07/31/24 0935   dextrose  50 % solution 0-50 mL, 0-50 mL, Intravenous, PRN, Emil, Dan, DO   docusate (COLACE) 50 MG/5ML liquid 100 mg, 100 mg, Oral, BID, Alghanim, Fahid, MD   feeding supplement (ENSURE PLUS HIGH PROTEIN) liquid 237 mL, 237 mL, Oral, BID BM, Alghanim, Fahid, MD   guaiFENesin  (MUCINEX ) 12 hr tablet 600 mg, 600 mg, Oral, BID PRN, Antonetta Moccasin B, NP   insulin  aspart (novoLOG ) injection 2-6 Units, 2-6 Units, Subcutaneous, Q4H,  Rosan Deward ORN, NP, 2 Units at 07/31/24 0900   insulin  glargine-yfgn (SEMGLEE ) injection 5 Units, 5 Units, Subcutaneous, BID, Hoffman, Paul W, NP, 5 Units at 07/31/24 0940   labetalol  (NORMODYNE ) injection 10 mg, 10 mg, Intravenous, Q10 min PRN, Rosan Deward ORN, NP, 10 mg at 07/28/24 1746   melatonin tablet 3 mg, 3 mg, Oral, QHS PRN, Antonetta Moccasin B, NP   metoprolol  tartrate (LOPRESSOR ) tablet 25 mg, 25 mg, Oral, BID, Hoffman, Paul W, NP, 25 mg at 07/31/24 9056   ondansetron  (ZOFRAN ) injection 4 mg, 4 mg, Intravenous, Q6H PRN, Claudene Toribio BROCKS, MD   Oral care mouth rinse, 15 mL, Mouth Rinse, PRN, Olalere, Adewale A, MD   Oral care mouth rinse, 15 mL, Mouth Rinse, PRN, Alghanim, Fahid, MD   pneumococcal 20-valent conjugate vaccine (PREVNAR 20 ) injection 0.5 mL, 0.5 mL, Intramuscular, Tomorrow-1000, Alghanim, Fahid, MD   polyethylene glycol (MIRALAX  / GLYCOLAX ) packet 17 g, 17 g, Oral, Daily, Alghanim, Fahid, MD   sodium chloride  flush (NS) 0.9 % injection 10-40 mL, 10-40 mL, Intracatheter, PRN, Alghanim, Fahid, MD   sodium chloride  flush (NS) 0.9 % injection 10-40 mL, 10-40 mL, Intracatheter, Q12H, Alghanim, Fahid, MD, 10 mL at 07/30/24 2230

## 2024-07-31 NOTE — Progress Notes (Signed)
 PHARMACY - ANTICOAGULATION CONSULT NOTE  Pharmacy Consult for heparin  --> Eliquis   Indication: acute pulmonary embolus  Allergies[1]  Patient Measurements: Height: 5' 11 (180.3 cm) Weight: 103.2 kg (227 lb 8.2 oz) IBW/kg (Calculated) : 75.3 HEPARIN  DW (KG): 98.5  Vital Signs: Temp: 99.4 F (37.4 C) (12/15 0409) Temp Source: Oral (12/15 0409) BP: 121/75 (12/15 0409) Pulse Rate: 92 (12/15 0619)  Labs: Recent Labs    07/29/24 0536 07/29/24 1005 07/29/24 1650 07/30/24 0153 07/31/24 0155  HGB 10.8*  --   --  10.8* 10.9*  HCT 33.5*  --   --  32.1* 32.6*  PLT 215  --   --  234 200  HEPARINUNFRC 0.31  --  0.45 0.39 0.32  CREATININE  --    < > 0.81 0.80 0.69   < > = values in this interval not displayed.    Estimated Creatinine Clearance: 121.6 mL/min (by C-G formula based on SCr of 0.69 mg/dL).   Medical History: Past Medical History:  Diagnosis Date   Hypertension    Nontoxic multinodular goiter    Obesity, unspecified    Plantar fasciitis    PSVT (paroxysmal supraventricular tachycardia)    noted on heart monitor 02/2020   Type I (juvenile type) diabetes mellitus without mention of complication, not stated as uncontrolled      Assessment: Patient is a 58 y.o M with hx DM1 who presented the ED on 07/20/24 after he was found unresponsive at home.  He was found to be in DKA, was subsequently intubated, and admitted to the ICU.  On 07/26/24, chest CT resulted back with large bilateral PE with RHS (consistent with at least submassive).  He underwent pulmonary artery thrombectomy on 07/26/24.  He is currently on heparin  drip for VTE treatment.   Today, 07/31/2024: - heparin  level is 0.32 - cbc stable - no bleeding documented - Pharmacy has been consulted to transition patient to Eliquis  today.   Goal of Therapy:  Heparin  level 0.3-0.7 units/ml Monitor platelets by anticoagulation protocol: Yes   Plan:  - d/c heparin  drip - start Eliquis  10 mg bid x7 days, then 5  mg bid  - pharmacy will sign off. Re-consult us  if need further assistance  Karey Suthers P 07/31/2024,8:28 AM      [1]  Allergies Allergen Reactions   Jardiance  [Empagliflozin ] Other (See Comments)    Precipitated DKA   Metformin And Related Nausea Only

## 2024-08-01 LAB — BASIC METABOLIC PANEL WITH GFR
Anion gap: 17 — ABNORMAL HIGH (ref 5–15)
BUN: 6 mg/dL (ref 6–20)
CO2: 20 mmol/L — ABNORMAL LOW (ref 22–32)
Calcium: 8.4 mg/dL — ABNORMAL LOW (ref 8.9–10.3)
Chloride: 103 mmol/L (ref 98–111)
Creatinine, Ser: 0.81 mg/dL (ref 0.61–1.24)
GFR, Estimated: >60 mL/min (ref >60)
Glucose, Bld: 253 mg/dL — ABNORMAL HIGH (ref 70–99)
Potassium: 3.5 mmol/L (ref 3.5–5.1)
Sodium: 140 mmol/L (ref 135–145)

## 2024-08-01 LAB — GLUCOSE, CAPILLARY
Glucose-Capillary: 251 mg/dL — ABNORMAL HIGH (ref 70–99)
Glucose-Capillary: 263 mg/dL — ABNORMAL HIGH (ref 70–99)
Glucose-Capillary: 223 mg/dL — ABNORMAL HIGH (ref 70–99)
Glucose-Capillary: 267 mg/dL — ABNORMAL HIGH (ref 70–99)

## 2024-08-01 MED ORDER — INSULIN GLARGINE 100 UNIT/ML ~~LOC~~ SOLN
5.0000 [IU] | Freq: Two times a day (BID) | SUBCUTANEOUS | Status: DC
  Administered 2024-08-01 – 2024-08-02 (×3): 5 [IU] via SUBCUTANEOUS
  Filled 2024-08-01 (×4): qty 0.05

## 2024-08-01 MED ORDER — INSULIN GLARGINE 100 UNITS/ML SOLOSTAR PEN: 5.0000 [IU] | PEN_INJECTOR | Freq: Two times a day (BID) | SUBCUTANEOUS | Status: DC

## 2024-08-01 MED ORDER — DILTIAZEM HCL ER COATED BEADS 120 MG PO CP24
120.0000 mg | ORAL_CAPSULE | Freq: Every day | ORAL | Status: DC
  Administered 2024-08-01 – 2024-08-14 (×14): 120 mg via ORAL
  Filled 2024-08-01 (×13): qty 1

## 2024-08-01 NOTE — Inpatient Diabetes Management (Signed)
 Inpatient Diabetes Program Recommendations  AACE/ADA: New Consensus Statement on Inpatient Glycemic Control (2015)  Target Ranges:  Prepandial:   less than 140 mg/dL      Peak postprandial:   less than 180 mg/dL (1-2 hours)      Critically ill patients:  140 - 180 mg/dL   Lab Results  Component Value Date   GLUCAP 263 (H) 08/01/2024   HGBA1C 8.8 (H) 07/21/2024    Review of Glycemic Control  Latest Reference Range & Units 07/31/24 21:37 08/01/24 07:48 08/01/24 11:15  Glucose-Capillary 70 - 99 mg/dL 848 (H) 748 (H) 736 (H)  (H): Data is abnormally high  Inpatient Diabetes Program Recommendations:    Appears Lantus  5 units BID was discontinued yesterday.  Please consider reordering.    Thank you, Wyvonna Pinal, MSN, CDCES Diabetes Coordinator Inpatient Diabetes Program [phone] (team pager from 8a-5p)

## 2024-08-01 NOTE — Progress Notes (Addendum)
 Inpatient Rehab Admissions Coordinator:   Auth pending for CIR.  Dr. Babs to complete peer to peer discussion with [facility] Medical Director today between 1-4.  Will follow for determination.    13:00 received a denial from [facility] after P2P with Dr. Babs.  I updated pt on the phone and he would like to appeal so I will start that process today.  Reche Lowers, PT, DPT Admissions Coordinator [phone] 08/01/2024 11:11 AM

## 2024-08-01 NOTE — Progress Notes (Signed)
°   08/01/24 2100  BiPAP/CPAP/SIPAP  BiPAP/CPAP/SIPAP Pt Type Adult  Reason BIPAP/CPAP not in use Other(comment)

## 2024-08-01 NOTE — TOC Progression Note (Signed)
 Transition of Care [facility]) - Progression Note    Patient Details  Name: Jorge Calhoun MRN: [id number] Date of Birth: 16-Aug-1966  [facility] CM/SW Contact  Ender Rorke, Nathanel, RN Phone Number: 08/01/2024, 2:55 PM  Clinical Narrative: faxed w/confirmation to CIR rep Caitlin fax#[phone]-patient's signature on Appointment of Representative-Patient's appeal to denial post P2P for CIR.      Expected Discharge Plan: IP Rehab Facility Barriers to Discharge: Continued Medical Work up               Expected Discharge Plan and Services   Discharge Planning Services: CM Consult Post Acute Care Choice: NA Living arrangements for the past 2 months: Single Family Home                                       Social Drivers of Health (SDOH) Interventions SDOH Screenings   Food Insecurity: No Food Insecurity (07/22/2024)  Housing: Low Risk (07/22/2024)  Transportation Needs: Patient Unable To Answer (07/22/2024)  Utilities: Patient Unable To Answer (07/22/2024)  Tobacco Use: Low Risk (07/20/2024)    Readmission Risk Interventions    07/24/2024    8:55 AM  Readmission Risk Prevention Plan  Post Dischage Appt Complete  Medication Screening Complete  Transportation Screening Complete

## 2024-08-01 NOTE — Progress Notes (Signed)
 Mobility Specialist - Progress Note   08/01/24 1004  Mobility  Activity Ambulated with assistance;Pivoted/transferred from bed to chair  Level of Assistance Minimal assist, patient does 75% or more  Assistive Device Front wheel walker  Distance Ambulated (ft) 3 ft  Range of Motion/Exercises Active  Activity Response Tolerated well  Mobility visit 1 Mobility  Mobility Specialist Start Time (ACUTE ONLY) 0950  Mobility Specialist Stop Time (ACUTE ONLY) 1004  Mobility Specialist Time Calculation (min) (ACUTE ONLY) 14 min   Pt was found in bed and agreeable to mobilize. Very motivated with session stating feeling stronger. At EOS was left on recliner chair with all needs met. Call bell in reach and wife in room. Chair alarm on.   Erminio Leos,  Mobility Specialist Can be reached via Secure Chat

## 2024-08-01 NOTE — Progress Notes (Signed)
 PROGRESS NOTE    Jorge Calhoun  FMW:[id number] DOB: 11-Feb-1966 DOA: 07/20/2024 PCP: Seabron Lenis, MD   Brief Narrative: 58 year old with past medical history significant for hypertension, nontoxic multinodular goiter, obesity, PSVT, type 1 diabetes who was found down unresponsive at home on 12/4.  He presented to the [facility] 12/3 after 3 to 4 days of feeling generally unwell, poor oral intake.  Per wife he received something for an infection.  On the day of admission he was found unresponsive at home.  On EMS arrival he was hypotensive systolic blood pressure in the 80s oxygen saturation 100% on 2 L CBG 400.  He was found to be in DKA, hyperglycemia, received fluids and insulin  drip.  He was found to have rhinovirus positive.  He was intubated for airway protection in the setting of altered mental status.  Hospital course: 12/4 Admit in DKA, RVP panel positive for rhinovirus/enterovirus 12/5 Progressive decline in mentation with eventual development of unresponsiveness intubated for airway protection with central line and A-line placed as well 12/9, extubated, aline out 12/10 was doing well pending tx out of ICU> acute SOB when OOB> massive bilateral PE with RV dysfunction/ shock> IR thrombectomy, heparin , NE 12/11 - 12/12 --> euglycemic DKA on insulin  drip, levo weaned off  Assessment & Plan:   Principal Problem:   DKA (diabetic ketoacidosis) (HCC) Active Problems:   DKA, type 1 (HCC)   Acute saddle pulmonary embolism with acute cor pulmonale (HCC)   1-Acute hypoxic respiratory failure -Massive bilateral PE with cor pulmonale status post IR thrombectomy 12/10 -Obstructive shock secondary to PE: Resolved - Patient initially intubated 12/5 for airway protection.  Subsequently extubated 12/9. - Patient developed acute shortness of breath CT angio showed massive bilateral PE with RV dysfunction and shock, IR performed thrombectomy. -He has been transition to Eliquis  12/15 Stable.   MSSA  pneumonia Rhinovirus infection 1 out of 4 positive for strep Viridians;  Completed  10 of antibiotics Repeated blood cultures 12/13 no growth today.  AKI In the setting of hypotension and IV contrast, diuretics.  Resolved  Anion gap metabolic acidosis: Resolved  Hypernatremia: Improving Encourage oral intake  Hypokalemia Replace orally  DKA type 1 diabetes Received insulin  drip Beta hydroxybutyric acid ending down. Continue Lantus  twice daily, continue sliding scale insulin  Change SSI to ACHS.   Hypertension Continue with metoprolol  twice daily Resume lower dose home Cardizem .  Hyperlipidemia   Hypophosphatemia: Replaced.    See wound care documentation below  Wound 07/27/24 2100 Pressure Injury Heel Right Deep Tissue Pressure Injury - Purple or maroon localized area of discolored intact skin or blood-filled blister due to damage of underlying soft tissue from pressure and/or shear. (Active)     Nutrition Problem: Inadequate oral intake Etiology: inability to eat    Signs/Symptoms: NPO status    Interventions: Refer to RD note for recommendations, Tube feeding  Estimated body mass index is 30.9 kg/m as calculated from the following:   Height as of this encounter: 5' 11 (1.803 m).   Weight as of this encounter: 100.5 kg.   DVT prophylaxis: Heparin  drip Code Status: Full code Family Communication: Wife updated at bedside Disposition Plan:  Status is: Inpatient Remains inpatient appropriate because: stable for CIR, patient will benefit from CIR admission.     Consultants:  Pulmonologist  Procedures:  Thrombectomy  Antimicrobials:  IV ceftriaxone   Subjective: He is alert and conversant, he is sitting in recliner today.  He is willing to work for inpatient rehab 3 hours  a day.  I think he will be a good candidate  Objective: Vitals:   08/01/24 0521 08/01/24 0645 08/01/24 0825 08/01/24 1248  BP: (!) 152/80  (!) 164/81 128/76  Pulse: (!) 102   86 90  Resp: 18 20 14 20   Temp: 98.7 F (37.1 C)   98.2 F (36.8 C)  TempSrc: Oral   Oral  SpO2: 96%  100% 99%  Weight: 109.5 kg 100.5 kg    Height:        Intake/Output Summary (Last 24 hours) at 08/01/2024 1442 Last data filed at 08/01/2024 1015 Gross per 24 hour  Intake 540 ml  Output 3650 ml  Net -3110 ml   Filed Weights   07/31/24 0409 08/01/24 0521 08/01/24 0645  Weight: 103.2 kg 109.5 kg 100.5 kg    Examination:  General exam: NAD Respiratory system: CTA Cardiovascular system: S 1, S 2 RRR Gastrointestinal system: BS present, soft, nt Central nervous system: Alert, follows command Extremities: no edema   Data Reviewed: I have personally reviewed following labs and imaging studies  CBC: Recent Labs  Lab 07/27/24 0449 07/27/24 0807 07/28/24 0546 07/29/24 0536 07/30/24 0153 07/31/24 0155  WBC 13.6*  --  6.7 5.7 6.1 6.2  HGB 12.5* 12.2* 10.9* 10.8* 10.8* 10.9*  HCT 39.9 36.0* 34.0* 33.5* 32.1* 32.6*  MCV 100.0  --  97.4 97.4 93.9 93.9  PLT 196  --  203 215 234 200   Basic Metabolic Panel: Recent Labs  Lab 07/26/24 0307 07/26/24 1704 07/27/24 0449 07/27/24 0751 07/28/24 0546 07/28/24 0730 07/28/24 1640 07/29/24 0536 07/29/24 1005 07/29/24 1650 07/30/24 0153 07/30/24 0820 07/31/24 0155 08/01/24 1012  NA 149*   < > 150*   < >  --    < >  --   --  151* 145 146*  --  142 140  K 4.0   < > 4.6   < >  --    < >  --   --  3.4* 3.6 3.4*  --  3.0* 3.5  CL 108  --  109   < >  --    < >  --   --  114* 110 109  --  104 103  CO2 23  --  9*   < >  --    < >  --   --  21* 18* 21*  --  23 20*  GLUCOSE 165*  --  238*   < >  --    < >  --   --  141* 226* 132*  --  139* 253*  BUN 22*  --  32*   < >  --    < >  --   --  14 14 11   --  7 6  CREATININE 0.90  --  1.28*   < >  --    < >  --   --  0.82 0.81 0.80  --  0.69 0.81  CALCIUM  8.6*  --  8.1*   < >  --    < >  --   --  7.5* 7.3* 8.0*  --  7.6* 8.4*  MG  --   --  2.6*  --  2.4  --  2.5* 2.2  --   --   --   --    --   --   PHOS 2.8  --   --   --   --   --   --  1.9*  --   --   --  2.1* 2.6  --    < > = values in this interval not displayed.   GFR: Estimated Creatinine Clearance: 118.6 mL/min (by C-G formula based on SCr of 0.81 mg/dL). Liver Function Tests: Recent Labs  Lab 07/27/24 0751  AST 28  ALT 26  ALKPHOS 71  BILITOT 0.3  PROT 5.2*  ALBUMIN 3.0*   No results for input(s): LIPASE, AMYLASE in the last 168 hours. No results for input(s): AMMONIA in the last 168 hours. Coagulation Profile: Recent Labs  Lab 07/26/24 1902  INR 1.2   Cardiac Enzymes: No results for input(s): CKTOTAL, CKMB, CKMBINDEX, TROPONINI in the last 168 hours. BNP (last 3 results) Recent Labs    07/26/24 1902  PROBNP 605.0*   HbA1C: No results for input(s): HGBA1C in the last 72 hours. CBG: Recent Labs  Lab 07/31/24 1148 07/31/24 1633 07/31/24 2137 08/01/24 0748 08/01/24 1115  GLUCAP 174* 179* 151* 251* 263*   Lipid Profile: No results for input(s): CHOL, HDL, LDLCALC, TRIG, CHOLHDL, LDLDIRECT in the last 72 hours. Thyroid  Function Tests: No results for input(s): TSH, T4TOTAL, FREET4, T3FREE, THYROIDAB in the last 72 hours. Anemia Panel: No results for input(s): VITAMINB12, FOLATE, FERRITIN, TIBC, IRON, RETICCTPCT in the last 72 hours. Sepsis Labs: Recent Labs  Lab 07/26/24 1903 07/27/24 0753  LATICACIDVEN 1.4 1.8    Recent Results (from the past 240 hours)  Culture, blood (Routine X 2) w Reflex to ID Panel     Status: None   Collection Time: 07/22/24  7:25 PM   Specimen: BLOOD RIGHT ARM  Result Value Ref Range Status   Specimen Description   Final    BLOOD RIGHT ARM Performed at [facility], [address], [place], [place] [address]    Special Requests   Final    BOTTLES DRAWN AEROBIC ONLY Blood Culture adequate volume Performed at [facility], [address] [place], [place] [address]    Culture    Final    NO GROWTH 5 DAYS Performed at [facility], [address], [place], [place] [address]    Report Status 07/27/2024 FINAL  Final  Culture, blood (Routine X 2) w Reflex to ID Panel     Status: None   Collection Time: 07/22/24  7:25 PM   Specimen: BLOOD RIGHT ARM  Result Value Ref Range Status   Specimen Description   Final    BLOOD RIGHT ARM Performed at [facility], [address], [place], [place] [address]    Special Requests   Final    BOTTLES DRAWN AEROBIC ONLY Blood Culture adequate volume Performed at [facility], [address], [place], [place] [address]    Culture   Final    NO GROWTH 5 DAYS Performed at [facility], [address] [place], [place] [address]    Report Status 07/27/2024 FINAL  Final  MRSA Next Gen by PCR, Nasal     Status: None   Collection Time: 07/24/24  9:45 AM   Specimen: Nasal Mucosa; Nasal Swab  Result Value Ref Range Status   MRSA by PCR Next Gen NOT DETECTED NOT DETECTED Final    Comment: (NOTE) The GeneXpert MRSA Assay (FDA approved for NASAL specimens only), is one component of a comprehensive MRSA colonization surveillance program. It is not intended to diagnose MRSA infection nor to guide or monitor treatment for MRSA infections. Test performance  is not FDA approved in patients less than 13 years old. Performed at [facility], [address] [place], [place] [address]   Culture, blood (Routine X 2) w Reflex to ID Panel     Status: None (Preliminary result)   Collection Time: 07/29/24 12:37 PM   Specimen: BLOOD LEFT ARM  Result Value Ref Range Status   Specimen Description   Final    BLOOD LEFT ARM Performed at [facility], [address], [place], [place] [address]    Special Requests   Final    BOTTLES DRAWN AEROBIC ONLY Blood Culture adequate volume Performed at [facility], [address] [place], [place] [address]    Culture   Final     NO GROWTH 3 DAYS Performed at [facility], [address], [place], [place] [address]    Report Status PENDING  Incomplete  Culture, blood (Routine X 2) w Reflex to ID Panel     Status: None (Preliminary result)   Collection Time: 07/29/24 12:48 PM   Specimen: BLOOD LEFT ARM  Result Value Ref Range Status   Specimen Description   Final    BLOOD LEFT ARM Performed at [facility], [address] [place], [place] [address]    Special Requests   Final    BOTTLES DRAWN AEROBIC ONLY Blood Culture results may not be optimal due to an inadequate volume of blood received in culture bottles Performed at [facility], [address] [place] [address] [place], [place] [address]    Culture   Final    NO GROWTH 3 DAYS Performed at [facility], [address] [place], [place] [address]    Report Status PENDING  Incomplete         Radiology Studies: No results found.       Scheduled Meds:  apixaban   10 mg Oral BID   Followed by   NOREEN ON 08/07/2024] apixaban   5 mg Oral BID   Chlorhexidine  Gluconate Cloth  6 each Topical Daily   diltiazem   120 mg Oral Daily   docusate  100 mg Oral BID   feeding supplement  237 mL Oral BID BM   insulin  aspart  0-9 Units Subcutaneous TID WC   insulin  glargine  5 Units Subcutaneous BID   metoprolol  tartrate  25 mg Oral BID   pneumococcal 20-valent conjugate vaccine  0.5 mL Intramuscular Tomorrow-1000   polyethylene glycol  17 g Oral Daily   sodium chloride  flush  10-40 mL Intracatheter Q12H   Continuous Infusions:     LOS: 12 days    Time spent: 35 Minutes    Fairy Ashlock A Tonatiuh Mallon, MD [facility]   If 7PM-7AM, please contact night-coverage [email or web address]  08/01/2024, 2:42 PM

## 2024-08-02 ENCOUNTER — Other Ambulatory Visit (HOSPITAL_COMMUNITY): Payer: Self-pay

## 2024-08-02 DIAGNOSIS — E1011 Type 1 diabetes mellitus with ketoacidosis with coma: Secondary | ICD-10-CM | POA: Diagnosis not present

## 2024-08-02 LAB — BASIC METABOLIC PANEL WITH GFR
Anion gap: 19 — ABNORMAL HIGH (ref 5–15)
Anion gap: 17 — ABNORMAL HIGH (ref 5–15)
BUN: 6 mg/dL (ref 6–20)
BUN: 6 mg/dL (ref 6–20)
CO2: 19 mmol/L — ABNORMAL LOW (ref 22–32)
CO2: 17 mmol/L — ABNORMAL LOW (ref 22–32)
Calcium: 8.3 mg/dL — ABNORMAL LOW (ref 8.9–10.3)
Calcium: 7.9 mg/dL — ABNORMAL LOW (ref 8.9–10.3)
Chloride: 101 mmol/L (ref 98–111)
Chloride: 103 mmol/L (ref 98–111)
Creatinine, Ser: 0.76 mg/dL (ref 0.61–1.24)
Creatinine, Ser: 0.73 mg/dL (ref 0.61–1.24)
GFR, Estimated: >60 mL/min (ref >60)
GFR, Estimated: >60 mL/min (ref >60)
Glucose, Bld: 294 mg/dL — ABNORMAL HIGH (ref 70–99)
Glucose, Bld: 194 mg/dL — ABNORMAL HIGH (ref 70–99)
Potassium: 3.3 mmol/L — ABNORMAL LOW (ref 3.5–5.1)
Potassium: 4 mmol/L (ref 3.5–5.1)
Sodium: 139 mmol/L (ref 135–145)
Sodium: 137 mmol/L (ref 135–145)

## 2024-08-02 LAB — GLUCOSE, CAPILLARY
Glucose-Capillary: 277 mg/dL — ABNORMAL HIGH (ref 70–99)
Glucose-Capillary: 255 mg/dL — ABNORMAL HIGH (ref 70–99)
Glucose-Capillary: 281 mg/dL — ABNORMAL HIGH (ref 70–99)
Glucose-Capillary: 183 mg/dL — ABNORMAL HIGH (ref 70–99)
Glucose-Capillary: 156 mg/dL — ABNORMAL HIGH (ref 70–99)

## 2024-08-02 MED ORDER — SODIUM CHLORIDE 0.9 % IV BOLUS
500.0000 mL | Freq: Once | INTRAVENOUS | Status: AC
  Administered 2024-08-02: 11:00:00 500 mL via INTRAVENOUS

## 2024-08-02 MED ORDER — SODIUM CHLORIDE 0.9 % IV BOLUS
250.0000 mL | Freq: Once | INTRAVENOUS | Status: AC
  Administered 2024-08-02: 17:00:00 250 mL via INTRAVENOUS

## 2024-08-02 MED ORDER — GLUCERNA SHAKE PO LIQD
237.0000 mL | Freq: Two times a day (BID) | ORAL | Status: DC
  Administered 2024-08-03 – 2024-08-11 (×2): 237 mL via ORAL
  Filled 2024-08-02 (×23): qty 237

## 2024-08-02 MED ORDER — INSULIN GLARGINE 100 UNITS/ML SOLOSTAR PEN: 3.0000 [IU] | PEN_INJECTOR | Freq: Once | SUBCUTANEOUS | Status: DC

## 2024-08-02 MED ORDER — INSULIN GLARGINE 100 UNIT/ML ~~LOC~~ SOLN
3.0000 [IU] | SUBCUTANEOUS | Status: AC
  Administered 2024-08-02: 11:00:00 3 [IU] via SUBCUTANEOUS
  Filled 2024-08-02: qty 0.03

## 2024-08-02 MED ORDER — POTASSIUM CHLORIDE CRYS ER 20 MEQ PO TBCR
40.0000 meq | EXTENDED_RELEASE_TABLET | Freq: Once | ORAL | Status: AC
  Administered 2024-08-02: 11:00:00 40 meq via ORAL
  Filled 2024-08-02: qty 2

## 2024-08-02 MED ORDER — SODIUM CHLORIDE 0.9 % IV SOLN: INTRAVENOUS | Status: DC

## 2024-08-02 MED ORDER — INSULIN GLARGINE 100 UNIT/ML ~~LOC~~ SOLN
8.0000 [IU] | Freq: Two times a day (BID) | SUBCUTANEOUS | Status: DC
  Administered 2024-08-02 – 2024-08-03 (×2): 8 [IU] via SUBCUTANEOUS
  Filled 2024-08-02 (×3): qty 0.08

## 2024-08-02 MED ORDER — ENSURE MAX PROTEIN PO LIQD
11.0000 [oz_av] | Freq: Two times a day (BID) | ORAL | Status: DC
  Filled 2024-08-02 (×2): qty 330

## 2024-08-02 MED ORDER — INSULIN ASPART 100 UNIT/ML IJ SOLN
3.0000 [IU] | Freq: Three times a day (TID) | INTRAMUSCULAR | Status: DC
  Administered 2024-08-02 – 2024-08-03 (×3): 3 [IU] via SUBCUTANEOUS
  Filled 2024-08-02 (×3): qty 3

## 2024-08-02 NOTE — Progress Notes (Signed)
 Nutrition Follow-up  DOCUMENTATION CODES:   Obesity unspecified  INTERVENTION:   -Ensure MAX Protein po BID, each supplement provides 150 kcal and 30 grams of protein   NUTRITION DIAGNOSIS:   Inadequate oral intake related to inability to eat as evidenced by NPO status.  Now on heart healthy/CHO modified diet  GOAL:   Patient will meet greater than or equal to 90% of their needs  Progressing.   MONITOR:   PO intake, Supplement acceptance  ASSESSMENT:   58 year old male with PMH type 1 DM who presented with 3-4 days of feeling generally unwell, poor PO, N/V. Admit for rhinovirus and DKA.  12/4 Admit 12/5 Intubated 12/7 Trickle TF initiated 12/9 Extubated; CLD 12/10 Soft diet -> Carb Modified diet 12/11 NPO; on CPAP  Patient now on heart healthy/CHO modified diet.  Ensure Plus supplements ordered but CBGs running elevated and pt is not drinking. Will trial Ensure Max for additional protein. Pt with variable intakes, 0-50% of meals documented.  Plan is to potentially go to CIR, will need supplementation to support rehab efforts.  Admission weight: 240 lbs Current weight: 216 lbs  Medications: Colace, insulin , Miralax , KLOR-CON   Labs reviewed: CBGs: 255-281 Low potassium  Diet Order:   Diet Order             Diet heart healthy/carb modified Room service appropriate? Yes; Fluid consistency: Thin  Diet effective now                   EDUCATION NEEDS:   No education needs have been identified at this time  Skin:  Skin Assessment: Skin Integrity Issues: Skin Integrity Issues:: DTI DTI: rt heel  Last BM:  12/15 -type 6  Height:   Ht Readings from Last 1 Encounters:  07/21/24 5' 11 (1.803 m)    Weight:   Wt Readings from Last 1 Encounters:  08/02/24 98.3 kg    Ideal Body Weight:  78.18 kg  BMI:  Body mass index is 30.23 kg/m.  Estimated Nutritional Needs:   Kcal:  1950-2200 kcals  Protein:  105-125 grams  Fluid:  >/=  2L  Morna Lee, MS, RD, LDN Inpatient Clinical Dietitian Contact via Secure chat

## 2024-08-02 NOTE — Progress Notes (Signed)
°   08/02/24 2100  BiPAP/CPAP/SIPAP  BiPAP/CPAP/SIPAP Pt Type Adult  Reason BIPAP/CPAP not in use Non-compliant (Pt stated he would rather wear his home machine. Wife will bring tomro.)

## 2024-08-02 NOTE — Progress Notes (Signed)
 PROGRESS NOTE    Jorge Calhoun  FMW:[id number] DOB: [birth year] DOA: 07/20/2024 PCP: Seabron Lenis, MD   Brief Narrative: 58 year old with past medical history significant for hypertension, nontoxic multinodular goiter, obesity, PSVT, type 1 diabetes who was found down unresponsive at home on 12/4.  He presented to the [facility] 12/3 after 3 to 4 days of feeling generally unwell, poor oral intake.  Per wife he received something for an infection.  On the day of admission he was found unresponsive at home.  On EMS arrival he was hypotensive systolic blood pressure in the 80s oxygen saturation 100% on 2 L CBG 400.  He was found to be in DKA, hyperglycemia, received fluids and insulin  drip.  He was found to have rhinovirus positive.  He was intubated for airway protection in the setting of altered mental status.  Hospital course: 12/4 Admit in DKA, RVP panel positive for rhinovirus/enterovirus 12/5 Progressive decline in mentation with eventual development of unresponsiveness intubated for airway protection with central line and A-line placed as well 12/9, extubated, aline out 12/10 was doing well pending tx out of ICU> acute SOB when OOB> massive bilateral PE with RV dysfunction/ shock> IR thrombectomy, heparin , NE 12/11 - 12/12 --> euglycemic DKA on insulin  drip, levo weaned off  Assessment & Plan:   Principal Problem:   DKA (diabetic ketoacidosis) (HCC) Active Problems:   DKA, type 1 (HCC)   Acute saddle pulmonary embolism with acute cor pulmonale (HCC)   1-Acute hypoxic respiratory failure -Massive bilateral PE with cor pulmonale status post IR thrombectomy 12/10 -Obstructive shock secondary to PE: Resolved - Patient initially intubated 12/5 for airway protection.  Subsequently extubated 12/9. - Patient developed acute shortness of breath CT angio showed massive bilateral PE with RV dysfunction and shock, IR performed thrombectomy. -He has been transition to Eliquis  12/15 Stable.   MSSA  pneumonia Rhinovirus infection 1 out of 4 positive for strep Viridians;  Completed  10 of antibiotics Repeated blood cultures 12/13 no growth today.  AKI In the setting of hypotension and IV contrast, diuretics.  Resolved  Anion gap metabolic acidosis: gap increased today, will give IV fluids, increase insulin . Repeat bmet this afternoon.   Hypernatremia: Improving Encourage oral intake  Hypokalemia Replace orally  DKA type 1 diabetes Received insulin  drip Beta hydroxybutyric acid trending down. Increase lantus  to 8 units BID. Will add meals coverage.  Gap increased, and cbg increasing. He is eating more.   Hypertension Continue with metoprolol  twice daily Resume lower dose home Cardizem .  Hyperlipidemia   Hypophosphatemia: Replaced.    See wound care documentation below  Wound 07/27/24 2100 Pressure Injury Heel Right Deep Tissue Pressure Injury - Purple or maroon localized area of discolored intact skin or blood-filled blister due to damage of underlying soft tissue from pressure and/or shear. (Active)     Nutrition Problem: Inadequate oral intake Etiology: inability to eat    Signs/Symptoms: NPO status    Interventions: Refer to RD note for recommendations, Tube feeding  Estimated body mass index is 30.23 kg/m as calculated from the following:   Height as of this encounter: 5' 11 (1.803 m).   Weight as of this encounter: 98.3 kg.   DVT prophylaxis: Heparin  drip Code Status: Full code Family Communication: Wife updated at bedside Disposition Plan:  Status is: Inpatient Remains inpatient appropriate because: stable for CIR, patient will benefit from CIR admission.     Consultants:  Pulmonologist  Procedures:  Thrombectomy  Antimicrobials:  IV ceftriaxone   Subjective: He  is doing better, denies dyspnea, denies abdominal pain or nausea.   Objective: Vitals:   08/01/24 0825 08/01/24 1248 08/01/24 2128 08/02/24 0532  BP: (!) 164/81 128/76  (!) 150/69 137/71  Pulse: 86 90 90 82  Resp: 14 20 18 18   Temp:  98.2 F (36.8 C) 97.9 F (36.6 C) 98.3 F (36.8 C)  TempSrc:  Oral Oral   SpO2: 100% 99% 98% 98%  Weight:    98.3 kg  Height:        Intake/Output Summary (Last 24 hours) at 08/02/2024 1052 Last data filed at 08/02/2024 0900 Gross per 24 hour  Intake 250 ml  Output 1450 ml  Net -1200 ml   Filed Weights   08/01/24 0521 08/01/24 0645 08/02/24 0532  Weight: 109.5 kg 100.5 kg 98.3 kg    Examination:  General exam: NAD Respiratory system: CTA Cardiovascular system: S 1, s 2 RRR Gastrointestinal system: bs present, soft, nt Central nervous system: alert Extremities: no edema   Data Reviewed: I have personally reviewed following labs and imaging studies  CBC: Recent Labs  Lab 07/27/24 0449 07/27/24 0807 07/28/24 0546 07/29/24 0536 07/30/24 0153 07/31/24 0155  WBC 13.6*  --  6.7 5.7 6.1 6.2  HGB 12.5* 12.2* 10.9* 10.8* 10.8* 10.9*  HCT 39.9 36.0* 34.0* 33.5* 32.1* 32.6*  MCV 100.0  --  97.4 97.4 93.9 93.9  PLT 196  --  203 215 234 200   Basic Metabolic Panel: Recent Labs  Lab 07/27/24 0449 07/27/24 0751 07/28/24 0546 07/28/24 0730 07/28/24 1640 07/29/24 0536 07/29/24 1005 07/29/24 1650 07/30/24 0153 07/30/24 0820 07/31/24 0155 08/01/24 1012 08/02/24 0900  NA 150*   < >  --    < >  --   --    < > 145 146*  --  142 140 139  K 4.6   < >  --    < >  --   --    < > 3.6 3.4*  --  3.0* 3.5 3.3*  CL 109   < >  --    < >  --   --    < > 110 109  --  104 103 101  CO2 9*   < >  --    < >  --   --    < > 18* 21*  --  23 20* 19*  GLUCOSE 238*   < >  --    < >  --   --    < > 226* 132*  --  139* 253* 294*  BUN 32*   < >  --    < >  --   --    < > 14 11  --  7 6 6   CREATININE 1.28*   < >  --    < >  --   --    < > 0.81 0.80  --  0.69 0.81 0.76  CALCIUM  8.1*   < >  --    < >  --   --    < > 7.3* 8.0*  --  7.6* 8.4* 8.3*  MG 2.6*  --  2.4  --  2.5* 2.2  --   --   --   --   --   --   --   PHOS  --   --    --   --   --  1.9*  --   --   --  2.1* 2.6  --   --    < > =  values in this interval not displayed.   GFR: Estimated Creatinine Clearance: 118.8 mL/min (by C-G formula based on SCr of 0.76 mg/dL). Liver Function Tests: Recent Labs  Lab 07/27/24 0751  AST 28  ALT 26  ALKPHOS 71  BILITOT 0.3  PROT 5.2*  ALBUMIN 3.0*   No results for input(s): LIPASE, AMYLASE in the last 168 hours. No results for input(s): AMMONIA in the last 168 hours. Coagulation Profile: Recent Labs  Lab 07/26/24 1902  INR 1.2   Cardiac Enzymes: No results for input(s): CKTOTAL, CKMB, CKMBINDEX, TROPONINI in the last 168 hours. BNP (last 3 results) Recent Labs    07/26/24 1902  PROBNP 605.0*   HbA1C: No results for input(s): HGBA1C in the last 72 hours. CBG: Recent Labs  Lab 08/01/24 1115 08/01/24 1610 08/01/24 2129 08/02/24 0251 08/02/24 0752  GLUCAP 263* 223* 267* 277* 255*   Lipid Profile: No results for input(s): CHOL, HDL, LDLCALC, TRIG, CHOLHDL, LDLDIRECT in the last 72 hours. Thyroid  Function Tests: No results for input(s): TSH, T4TOTAL, FREET4, T3FREE, THYROIDAB in the last 72 hours. Anemia Panel: No results for input(s): VITAMINB12, FOLATE, FERRITIN, TIBC, IRON, RETICCTPCT in the last 72 hours. Sepsis Labs: Recent Labs  Lab 07/26/24 1903 07/27/24 0753  LATICACIDVEN 1.4 1.8    Recent Results (from the past 240 hours)  MRSA Next Gen by PCR, Nasal     Status: None   Collection Time: 07/24/24  9:45 AM   Specimen: Nasal Mucosa; Nasal Swab  Result Value Ref Range Status   MRSA by PCR Next Gen NOT DETECTED NOT DETECTED Final    Comment: (NOTE) The GeneXpert MRSA Assay (FDA approved for NASAL specimens only), is one component of a comprehensive MRSA colonization surveillance program. It is not intended to diagnose MRSA infection nor to guide or monitor treatment for MRSA infections. Test performance is not FDA approved in  patients less than 52 years old. Performed at [facility], [address] [place], [place] [address]   Culture, blood (Routine X 2) w Reflex to ID Panel     Status: None (Preliminary result)   Collection Time: 07/29/24 12:37 PM   Specimen: BLOOD LEFT ARM  Result Value Ref Range Status   Specimen Description   Final    BLOOD LEFT ARM Performed at [facility], [address] [place], [place] [address]    Special Requests   Final    BOTTLES DRAWN AEROBIC ONLY Blood Culture adequate volume Performed at [facility], [address], [place], [place] [address]    Culture   Final    NO GROWTH 4 DAYS Performed at [facility], [address] [place], [place] [address]    Report Status PENDING  Incomplete  Culture, blood (Routine X 2) w Reflex to ID Panel     Status: None (Preliminary result)   Collection Time: 07/29/24 12:48 PM   Specimen: BLOOD LEFT ARM  Result Value Ref Range Status   Specimen Description   Final    BLOOD LEFT ARM Performed at [facility], [address], [place], [place] [address]    Special Requests   Final    BOTTLES DRAWN AEROBIC ONLY Blood Culture results may not be optimal due to an inadequate volume of blood received in culture bottles Performed at [facility], [address], [place], [place] [address]    Culture   Final    NO GROWTH 4 DAYS Performed at  [facility], [address] [place], [place], [place] [address]    Report Status PENDING  Incomplete         Radiology Studies: No results found.       Scheduled Meds:  apixaban   10 mg Oral BID   Followed by   NOREEN ON 08/07/2024] apixaban   5 mg Oral BID   Chlorhexidine  Gluconate Cloth  6 each Topical Daily   diltiazem   120 mg Oral Daily   docusate  100 mg Oral BID   feeding supplement  237 mL Oral BID BM   insulin  aspart  0-9 Units Subcutaneous TID WC   insulin  aspart  3 Units Subcutaneous TID WC   insulin   glargine  8 Units Subcutaneous BID   insulin  glargine  3 Units Subcutaneous Once   metoprolol  tartrate  25 mg Oral BID   pneumococcal 20-valent conjugate vaccine  0.5 mL Intramuscular Tomorrow-1000   polyethylene glycol  17 g Oral Daily   potassium chloride   40 mEq Oral Once   sodium chloride  flush  10-40 mL Intracatheter Q12H   Continuous Infusions:  sodium chloride      sodium chloride         LOS: 13 days    Time spent: 35 Minutes    Asa Baudoin A Ellanore Vanhook, MD [facility]   If 7PM-7AM, please contact night-coverage [email or web address]  08/02/2024, 10:52 AM

## 2024-08-02 NOTE — Progress Notes (Signed)
 Inpatient Rehab Admissions Coordinator:   Awaiting determination on expedited appeal filed with [facility] for CIR prior auth.  Will follow.   Reche Lowers, PT, DPT Admissions Coordinator [phone] 08/02/2024 10:24 AM

## 2024-08-02 NOTE — Progress Notes (Signed)
 Physical Therapy Treatment Patient Details Name: Jorge Calhoun MRN: [id number] DOB: [birth year] Today's Date: 08/02/2024   History of Present Illness Jospeh Calhoun is a 58 y/o M who presents for DKA likely in the setting of rhinovirus URTI, wife found on bathroom floor unresponsive, required invasive mechanical ventilation for metabolic encephalopathy.  12/10 pt with significant desaturation into 60's when up on BSC and needed Bipap and found to have B PE and underwent IR thrombectomy. PMH significant for type I DM, HTN, hyperthyroidism    PT Comments  Pt agreeable to therapy session. He reports feeling well on today. He denied dizziness during session. HR 90 bpm, O2 90% on RA with ambulation. Min A for mobility safety on today. Rest breaks taken as needed throughout session. Pt appears to be progressing well. Awaiting insurance decision regarding AIR.Feel pt would benefit from AIR to regain prior level of functional mobility and independence.     If plan is discharge home, recommend the following: A little help with walking and/or transfers;A little help with bathing/dressing/bathroom;Assistance with cooking/housework;Assist for transportation;Help with stairs or ramp for entrance   Can travel by private vehicle        Equipment Recommendations  Rolling walker (2 wheels)    Recommendations for Other Services       Precautions / Restrictions Precautions Precautions: Fall Precaution/Restrictions Comments: watch HR and BP Restrictions Weight Bearing Restrictions Per Provider Order: No     Mobility  Bed Mobility               General bed mobility comments: oob in recliner    Transfers Overall transfer level: Needs assistance Equipment used: Rolling walker (2 wheels) Transfers: Sit to/from Stand Sit to Stand: Min assist           General transfer comment: Cues for safety, technique, hand placement. Assist to rise, steady, control descent. Increased time     Ambulation/Gait Ambulation/Gait assistance: Min assist Gait Distance (Feet): 30 Feet (x2) Assistive device: Rolling walker (2 wheels) Gait Pattern/deviations: Step-through pattern, Decreased stride length       General Gait Details: Cues for safety, posture. Slow gait speed. Assist to stabilize pt throughout distance. Pt denied dizziness. Tolerated ambulation well with rest breaks in between walks.   Stairs             Wheelchair Mobility     Tilt Bed    Modified Rankin (Stroke Patients Only)       Balance Overall balance assessment: Needs assistance         Standing balance support: Bilateral upper extremity supported, During functional activity, Reliant on assistive device for balance Standing balance-Leahy Scale: Fair                              Hotel Manager: No apparent difficulties  Cognition Arousal: Alert Behavior During Therapy: WFL for tasks assessed/performed   PT - Cognitive impairments: No apparent impairments                         Following commands: Intact      Cueing Cueing Techniques: Verbal cues  Exercises Other Exercises Other Exercises: Sit to stand x 5 reps    General Comments        Pertinent Vitals/Pain Pain Assessment Pain Assessment: Faces Faces Pain Scale: No hurt    Home Living  Prior Function            PT Goals (current goals can now be found in the care plan section) Progress towards PT goals: Progressing toward goals    Frequency    Min 3X/week      PT Plan      Co-evaluation              AM-PAC PT 6 Clicks Mobility   Outcome Measure  Help needed turning from your back to your side while in a flat bed without using bedrails?: A Little Help needed moving from lying on your back to sitting on the side of a flat bed without using bedrails?: A Little Help needed moving to and from a bed to a chair  (including a wheelchair)?: A Little Help needed standing up from a chair using your arms (e.g., wheelchair or bedside chair)?: A Little Help needed to walk in hospital room?: A Little Help needed climbing 3-5 steps with a railing? : A Lot 6 Click Score: 17    End of Session Equipment Utilized During Treatment: Gait belt Activity Tolerance: Patient tolerated treatment well Patient left: in chair;with call bell/phone within reach;with chair alarm set;with family/visitor present   PT Visit Diagnosis: Unsteadiness on feet (R26.81);Muscle weakness (generalized) (M62.81);Difficulty in walking, not elsewhere classified (R26.2)     Time: 8854-8797 PT Time Calculation (min) (ACUTE ONLY): 17 min  Charges:    $Gait Training: 8-22 mins PT General Charges $$ ACUTE PT VISIT: 1 Visit                        Dannial SQUIBB, PT Acute Rehabilitation  Office: [phone]

## 2024-08-03 DIAGNOSIS — E1011 Type 1 diabetes mellitus with ketoacidosis with coma: Secondary | ICD-10-CM | POA: Diagnosis not present

## 2024-08-03 LAB — CULTURE, BLOOD (ROUTINE X 2)
Culture: NO GROWTH
Culture: NO GROWTH
Special Requests: ADEQUATE

## 2024-08-03 LAB — GLUCOSE, CAPILLARY
Glucose-Capillary: 229 mg/dL — ABNORMAL HIGH (ref 70–99)
Glucose-Capillary: 181 mg/dL — ABNORMAL HIGH (ref 70–99)
Glucose-Capillary: 128 mg/dL — ABNORMAL HIGH (ref 70–99)
Glucose-Capillary: 121 mg/dL — ABNORMAL HIGH (ref 70–99)

## 2024-08-03 LAB — BASIC METABOLIC PANEL WITH GFR
Anion gap: 14 (ref 5–15)
BUN: <5 mg/dL — ABNORMAL LOW (ref 6–20)
CO2: 21 mmol/L — ABNORMAL LOW (ref 22–32)
Calcium: 8.1 mg/dL — ABNORMAL LOW (ref 8.9–10.3)
Chloride: 103 mmol/L (ref 98–111)
Creatinine, Ser: 0.7 mg/dL (ref 0.61–1.24)
GFR, Estimated: >60 mL/min (ref >60)
Glucose, Bld: 236 mg/dL — ABNORMAL HIGH (ref 70–99)
Potassium: 3.4 mmol/L — ABNORMAL LOW (ref 3.5–5.1)
Sodium: 139 mmol/L (ref 135–145)

## 2024-08-03 MED ORDER — INSULIN ASPART 100 UNIT/ML IJ SOLN
4.0000 [IU] | Freq: Three times a day (TID) | INTRAMUSCULAR | Status: DC
  Administered 2024-08-03 – 2024-08-05 (×7): 4 [IU] via SUBCUTANEOUS
  Filled 2024-08-03 (×6): qty 4

## 2024-08-03 MED ORDER — POTASSIUM CHLORIDE CRYS ER 20 MEQ PO TBCR
40.0000 meq | EXTENDED_RELEASE_TABLET | Freq: Once | ORAL | Status: AC
  Administered 2024-08-03: 09:00:00 40 meq via ORAL
  Filled 2024-08-03: qty 2

## 2024-08-03 MED ORDER — INSULIN GLARGINE 100 UNIT/ML ~~LOC~~ SOLN
10.0000 [IU] | Freq: Two times a day (BID) | SUBCUTANEOUS | Status: DC
  Administered 2024-08-03 – 2024-08-04 (×2): 10 [IU] via SUBCUTANEOUS
  Filled 2024-08-03 (×4): qty 0.1

## 2024-08-03 NOTE — Progress Notes (Signed)
 Inpatient Rehab Admissions Coordinator:   Notified by [facility] that expedited appeal was completed and the denial for CIR was upheld.  I notified pt who would like to discuss with family home vs. SNF.  I've notified MD and RNCM as well.  CIR will sign off at this time.   Reche Lowers, PT, DPT Admissions Coordinator [phone] 08/03/2024 3:33 PM

## 2024-08-03 NOTE — Progress Notes (Signed)
 Mobility Specialist - Progress Note   08/03/24 1214  Mobility  Activity Ambulated with assistance  Level of Assistance Contact guard assist, steadying assist  Assistive Device Front wheel walker  Distance Ambulated (ft) 60 ft  Range of Motion/Exercises Active  Activity Response Tolerated well  Mobility visit 1 Mobility  Mobility Specialist Start Time (ACUTE ONLY) 1120  Mobility Specialist Stop Time (ACUTE ONLY) 1140  Mobility Specialist Time Calculation (min) (ACUTE ONLY) 20 min   Pt was found in bed and agreeable to mobilize. No complaints. At EOS returned to sit EOB with all needs met. Call bell in reach.   Erminio Leos,  Mobility Specialist Can be reached via Secure Chat

## 2024-08-03 NOTE — Progress Notes (Signed)
 Occupational Therapy Treatment Patient Details Name: Jorge Calhoun MRN: [id number] DOB: 1965/12/16 Today's Date: 08/03/2024   History of present illness [name] is a 58 y/o M who presents for DKA likely in the setting of rhinovirus URTI, wife found on bathroom floor unresponsive, required invasive mechanical ventilation for metabolic encephalopathy.  12/10 pt with significant desaturation into 60's when up on BSC and needed Bipap and found to have B PE and underwent IR thrombectomy. PMH significant for type I DM, HTN, hyperthyroidism   OT comments  Patient seen for skilled OT session. Wife present bedside for education. Introduced biochemist, clinical to assist with LE reach with excellent carryover and progressed with seated and standing tolerance, standing balance and overall cog/processing. HR remained stable with pacing and rest breaks provided with patient eager and motivated throughout session. See below for current functional status. OT continues to recommend intensive inpatient follow-up therapy, >3 hours/day. Patient requires continued Acute care hospital level OT services to progress safety and functional performance and allow for discharge.        If plan is discharge home, recommend the following:  A lot of help with walking and/or transfers;A lot of help with bathing/dressing/bathroom;Assistance with cooking/housework;Assist for transportation;Help with stairs or ramp for entrance;Supervision due to cognitive status   Equipment Recommendations  Wheelchair (measurements OT);Wheelchair cushion (measurements OT);BSC/3in1;Tub/shower bench    Recommendations for Other Services Rehab consult    Precautions / Restrictions Precautions Precautions: Fall Recall of Precautions/Restrictions: Impaired Precaution/Restrictions Comments: watch HR and BP Restrictions Weight Bearing Restrictions Per Provider Order: No       Mobility Bed Mobility Overal bed mobility: Needs  Assistance             General bed mobility comments: oob in recliner    Transfers Overall transfer level: Needs assistance Equipment used: Rolling walker (2 wheels) Transfers: Sit to/from Stand, Bed to chair/wheelchair/BSC Sit to Stand: Min assist     Step pivot transfers: Min assist     General transfer comment: Cues for safety, technique, hand placement. Assist to rise, steady, control descent.     Balance Overall balance assessment: Needs assistance Sitting-balance support: No upper extremity supported, Feet supported Sitting balance-Leahy Scale: Fair Sitting balance - Comments: with UE support of rail Postural control: Right lateral lean, Left lateral lean Standing balance support: Bilateral upper extremity supported, During functional activity, Reliant on assistive device for balance Standing balance-Leahy Scale: Fair Standing balance comment: standing for ADL's and theract min a                           ADL either performed or assessed with clinical judgement   ADL Overall ADL's : Needs assistance/impaired Eating/Feeding: Modified independent;Sitting   Grooming: Wash/dry hands;Wash/dry face;Oral care;Contact guard assist;Standing Grooming Details (indicate cue type and reason): min cues for pacing and sequencing Upper Body Bathing: Contact guard assist;Sitting   Lower Body Bathing: Moderate assistance;Sit to/from stand;Cueing for safety;With adaptive equipment   Upper Body Dressing : Minimal assistance   Lower Body Dressing: Moderate assistance;Maximal assistance;Cueing for safety;Sit to/from stand Lower Body Dressing Details (indicate cue type and reason): training with reacher and sock aide due to decreased reach to R > L LE Toilet Transfer: Minimal assistance Toilet Transfer Details (indicate cue type and reason): min cues and RW use Toileting- Clothing Manipulation and Hygiene: Minimal assistance Toileting - Clothing Manipulation Details  (indicate cue type and reason): use of urinal     Functional  mobility during ADLs: Minimal assistance;Rolling walker (2 wheels);Cueing for sequencing General ADL Comments: limited ability to reach to LE's but did well with intro to reacher and sock aide use as weakness persists but improving overall which limits tasks with cues for problem solving    Extremity/Trunk Assessment Upper Extremity Assessment Upper Extremity Assessment: Generalized weakness;Right hand dominant   Lower Extremity Assessment Lower Extremity Assessment: Defer to PT evaluation        Vision   Vision Assessment?: Wears glasses for reading;Wears glasses for driving Additional Comments: no change from baseline   Perception Perception Perception: Within Functional Limits (for tasks presented appears able to demonstrate figure ground and visual scanning this session, may require higher level assessment once in rehab)   Praxis Praxis Praxis: Impaired Praxis Impairment Details: Motor planning;Organization Praxis-Other Comments: bradykinesia resolving, improved hand eye coordination and motor planning with target toss activity   Communication Communication Communication: No apparent difficulties   Cognition Arousal: Alert Behavior During Therapy: WFL for tasks assessed/performed Cognition: Cognition impaired           Executive functioning impairment (select all impairments): Problem solving, Sequencing OT - Cognition Comments: continues to make strong gains in cognition and processing, added cog processign component to target toss activity in sitting and standing with min cues to generate categories, able to follow steps to use sock aide with min cues and min cues to locate menu options for lunch on cell phone                 Following commands: Intact        Cueing   Cueing Techniques: Verbal cues  Exercises Exercises: Other exercises, General Lower Extremity       General Comments HR  remained stable during visit with 76-80 bpm given intermittent rests    Pertinent Vitals/ Pain       Pain Assessment Pain Assessment: No/denies pain   Frequency  Min 2X/week        Progress Toward Goals  OT Goals(current goals can now be found in the care plan section)  Progress towards OT goals: Progressing toward goals     Plan      Co-evaluation    PT/OT/SLP Co-Evaluation/Treatment: Yes            AM-PAC OT 6 Clicks Daily Activity     Outcome Measure   Help from another person eating meals?: None Help from another person taking care of personal grooming?: A Little Help from another person toileting, which includes using toliet, bedpan, or urinal?: A Lot Help from another person bathing (including washing, rinsing, drying)?: A Lot Help from another person to put on and taking off regular upper body clothing?: A Little Help from another person to put on and taking off regular lower body clothing?: A Lot 6 Click Score: 16    End of Session Equipment Utilized During Treatment: Gait belt;Rolling walker (2 wheels)  OT Visit Diagnosis: Unsteadiness on feet (R26.81);Muscle weakness (generalized) (M62.81);Cognitive communication deficit (R41.841)   Activity Tolerance Patient tolerated treatment well   Patient Left with chair alarm set;with family/visitor present;with call bell/phone within reach   Nurse Communication Mobility status;Other (comment) (replaced telebox battery)        Time: 8754-8679 OT Time Calculation (min): 35 min  Charges: OT General Charges $OT Visit: 1 Visit OT Treatments $Self Care/Home Management : 8-22 mins $Therapeutic Activity: 8-22 mins  Yelina Sarratt OT/L Acute Rehabilitation Department  [phone]  08/03/2024, 1:29 PM

## 2024-08-03 NOTE — Progress Notes (Signed)
 PROGRESS NOTE    Jorge Calhoun  FMW:[id number] DOB: 09/01/1965 DOA: 07/20/2024 PCP: Seabron Lenis, MD   Brief Narrative: 58 year old with past medical history significant for hypertension, nontoxic multinodular goiter, obesity, PSVT, type 1 diabetes who was found down unresponsive at home on 12/4.  He presented to the [facility] 12/3 after 3 to 4 days of feeling generally unwell, poor oral intake.  Per wife he received something for an infection.  On the day of admission he was found unresponsive at home.  On EMS arrival he was hypotensive systolic blood pressure in the 80s oxygen saturation 100% on 2 L CBG 400.  He was found to be in DKA, hyperglycemia, received fluids and insulin  drip.  He was found to have rhinovirus positive.  He was intubated for airway protection in the setting of altered mental status.  Hospital course: 12/4 Admit in DKA, RVP panel positive for rhinovirus/enterovirus 12/5 Progressive decline in mentation with eventual development of unresponsiveness intubated for airway protection with central line and A-line placed as well 12/9, extubated, aline out 12/10 was doing well pending tx out of ICU> acute SOB when OOB> massive bilateral PE with RV dysfunction/ shock> IR thrombectomy, heparin , NE 12/11 - 12/12 --> euglycemic DKA on insulin  drip, levo weaned off  Assessment & Plan:   Principal Problem:   DKA (diabetic ketoacidosis) (HCC) Active Problems:   DKA, type 1 (HCC)   Acute saddle pulmonary embolism with acute cor pulmonale (HCC)   1-Acute hypoxic respiratory failure -Massive bilateral PE with cor pulmonale status post IR thrombectomy 12/10 -Obstructive shock secondary to PE: Resolved - Patient initially intubated 12/5 for airway protection.  Subsequently extubated 12/9. - Patient developed acute shortness of breath CT angio showed massive bilateral PE with RV dysfunction and shock, IR performed thrombectomy. -He has been transition to Eliquis  12/15 -Stable.    MSSA pneumonia Rhinovirus infection 1 out of 4 positive for strep Viridians;  Completed  10 of antibiotics Repeated blood cultures 12/13 no growth today.  AKI In the setting of hypotension and IV contrast, diuretics.  Resolved  Anion gap metabolic acidosis: received extra dose insulin  and IV fluids. Resolved.   Hypernatremia: Improving Encourage oral intake  Hypokalemia Replete   DKA type 1 diabetes Received insulin  drip Beta hydroxybutyric acid trending down. Increased lantus  to 10 BID. Meal coverage 4 units.   Hypertension Continue with metoprolol  twice daily Resume lower dose home Cardizem .  Hyperlipidemia   Hypophosphatemia: Replaced.    See wound care documentation below  Wound 07/27/24 2100 Pressure Injury Heel Right Deep Tissue Pressure Injury - Purple or maroon localized area of discolored intact skin or blood-filled blister due to damage of underlying soft tissue from pressure and/or shear. (Active)     Nutrition Problem: Inadequate oral intake Etiology: inability to eat    Signs/Symptoms: NPO status    Interventions: Ensure Enlive (each supplement provides 350kcal and 20 grams of protein)  Estimated body mass index is 30.19 kg/m as calculated from the following:   Height as of this encounter: 5' 11 (1.803 m).   Weight as of this encounter: 98.2 kg.   DVT prophylaxis: Heparin  drip Code Status: Full code Family Communication: Wife updated at bedside Disposition Plan:  Status is: Inpatient Remains inpatient appropriate because: stable for CIR, patient will benefit from CIR admission.     Consultants:  Pulmonologist  Procedures:  Thrombectomy  Antimicrobials:  IV ceftriaxone   Subjective: He is doing well, breathing ok, denies pain  Objective: Vitals:   08/02/24  1947 08/03/24 0327 08/03/24 0647 08/03/24 0939  BP: 137/76 129/87    Pulse: 95 89    Resp: 17 18  16   Temp: 98.7 F (37.1 C) 98.3 F (36.8 C)    TempSrc: Oral Oral     SpO2: 98% 97%    Weight:   98.2 kg   Height:        Intake/Output Summary (Last 24 hours) at 08/03/2024 1325 Last data filed at 08/03/2024 0900 Gross per 24 hour  Intake 2168.13 ml  Output --  Net 2168.13 ml   Filed Weights   08/01/24 0645 08/02/24 0532 08/03/24 0647  Weight: 100.5 kg 98.3 kg 98.2 kg    Examination:  General exam: NAD Respiratory system: CTA Cardiovascular system: S 1, S 2 RRR Gastrointestinal system: BS present, soft, nt Central nervous system: Alert Extremities: no edema   Data Reviewed: I have personally reviewed following labs and imaging studies  CBC: Recent Labs  Lab 07/28/24 0546 07/29/24 0536 07/30/24 0153 07/31/24 0155  WBC 6.7 5.7 6.1 6.2  HGB 10.9* 10.8* 10.8* 10.9*  HCT 34.0* 33.5* 32.1* 32.6*  MCV 97.4 97.4 93.9 93.9  PLT 203 215 234 200   Basic Metabolic Panel: Recent Labs  Lab 07/28/24 0546 07/28/24 0730 07/28/24 1640 07/29/24 0536 07/29/24 1005 07/30/24 0820 07/31/24 0155 08/01/24 1012 08/02/24 0900 08/02/24 1557 08/03/24 0537  NA  --    < >  --   --    < >  --  142 140 139 137 139  K  --    < >  --   --    < >  --  3.0* 3.5 3.3* 4.0 3.4*  CL  --    < >  --   --    < >  --  104 103 101 103 103  CO2  --    < >  --   --    < >  --  23 20* 19* 17* 21*  GLUCOSE  --    < >  --   --    < >  --  139* 253* 294* 194* 236*  BUN  --    < >  --   --    < >  --  7 6 6 6  <5*  CREATININE  --    < >  --   --    < >  --  0.69 0.81 0.76 0.73 0.70  CALCIUM   --    < >  --   --    < >  --  7.6* 8.4* 8.3* 7.9* 8.1*  MG 2.4  --  2.5* 2.2  --   --   --   --   --   --   --   PHOS  --   --   --  1.9*  --  2.1* 2.6  --   --   --   --    < > = values in this interval not displayed.   GFR: Estimated Creatinine Clearance: 118.8 mL/min (by C-G formula based on SCr of 0.7 mg/dL). Liver Function Tests: No results for input(s): AST, ALT, ALKPHOS, BILITOT, PROT, ALBUMIN in the last 168 hours.  No results for input(s): LIPASE,  AMYLASE in the last 168 hours. No results for input(s): AMMONIA in the last 168 hours. Coagulation Profile: No results for input(s): INR, PROTIME in the last 168 hours.  Cardiac Enzymes: No results for input(s): CKTOTAL, CKMB, CKMBINDEX,  TROPONINI in the last 168 hours. BNP (last 3 results) Recent Labs    07/26/24 1902  PROBNP 605.0*   HbA1C: No results for input(s): HGBA1C in the last 72 hours. CBG: Recent Labs  Lab 08/02/24 1112 08/02/24 1647 08/02/24 2115 08/03/24 0749 08/03/24 1128  GLUCAP 281* 183* 156* 229* 181*   Lipid Profile: No results for input(s): CHOL, HDL, LDLCALC, TRIG, CHOLHDL, LDLDIRECT in the last 72 hours. Thyroid  Function Tests: No results for input(s): TSH, T4TOTAL, FREET4, T3FREE, THYROIDAB in the last 72 hours. Anemia Panel: No results for input(s): VITAMINB12, FOLATE, FERRITIN, TIBC, IRON, RETICCTPCT in the last 72 hours. Sepsis Labs: No results for input(s): PROCALCITON, LATICACIDVEN in the last 168 hours.   Recent Results (from the past 240 hours)  Culture, blood (Routine X 2) w Reflex to ID Panel     Status: None   Collection Time: 07/29/24 12:37 PM   Specimen: BLOOD LEFT ARM  Result Value Ref Range Status   Specimen Description   Final    BLOOD LEFT ARM Performed at [facility], [address], [place], [place] [address]    Special Requests   Final    BOTTLES DRAWN AEROBIC ONLY Blood Culture adequate volume Performed at [facility], [address] [place], [place] [address]    Culture   Final    NO GROWTH 5 DAYS Performed at [facility], [address] [place], [place] [address]    Report Status 08/03/2024 FINAL  Final  Culture, blood (Routine X 2) w Reflex to ID Panel     Status: None   Collection Time: 07/29/24 12:48 PM   Specimen: BLOOD LEFT ARM  Result Value Ref Range Status   Specimen Description   Final    BLOOD LEFT ARM Performed at  [facility], [address], [place], [place] [address]    Special Requests   Final    BOTTLES DRAWN AEROBIC ONLY Blood Culture results may not be optimal due to an inadequate volume of blood received in culture bottles Performed at [facility], [address], [place], [place] [address]    Culture   Final    NO GROWTH 5 DAYS Performed at [facility], [address] [place], [place] [address]    Report Status 08/03/2024 FINAL  Final         Radiology Studies: No results found.       Scheduled Meds:  apixaban   10 mg Oral BID   Followed by   NOREEN ON 08/07/2024] apixaban   5 mg Oral BID   Chlorhexidine  Gluconate Cloth  6 each Topical Daily   diltiazem   120 mg Oral Daily   docusate  100 mg Oral BID   feeding supplement (GLUCERNA SHAKE)  237 mL Oral BID BM   insulin  aspart  0-9 Units Subcutaneous TID WC   insulin  aspart  4 Units Subcutaneous TID WC   insulin  glargine  10 Units Subcutaneous BID   metoprolol  tartrate  25 mg Oral BID   pneumococcal 20-valent conjugate vaccine  0.5 mL Intramuscular Tomorrow-1000   polyethylene glycol  17 g Oral Daily   sodium chloride  flush  10-40 mL Intracatheter Q12H   Continuous Infusions:      LOS: 14 days    Time spent: 35 Minutes    Jorge Wickstrom A Esias Mory, MD [facility]   If 7PM-7AM, please contact night-coverage [email or web address]  08/03/2024, 1:25 PM

## 2024-08-03 NOTE — Progress Notes (Signed)
 Inpatient Rehab Admissions Coordinator:   Awaiting determination on expedited appeal for CIR. Will follow.   Reche Lowers, PT, DPT Admissions Coordinator [phone] 08/03/2024 10:20 AM

## 2024-08-03 NOTE — TOC Progression Note (Signed)
 Transition of Care [facility]) - Progression Note    Patient Details  Name: Jorge Calhoun MRN: [id number] Date of Birth: 1966-05-18  Transition of Care [facility]) CM/SW Contact  Caliah Kopke, Nathanel, RN Phone Number: 08/03/2024, 3:47 PM  Clinical Narrative: Noted CIR appeal denied. Await PT re eval to asst w/d/c plans.      Expected Discharge Plan: IP Rehab Facility Barriers to Discharge: Continued Medical Work up               Expected Discharge Plan and Services   Discharge Planning Services: CM Consult Post Acute Care Choice: NA Living arrangements for the past 2 months: Single Family Home                                       Social Drivers of Health (SDOH) Interventions SDOH Screenings   Food Insecurity: No Food Insecurity (07/22/2024)  Housing: Low Risk (07/22/2024)  Transportation Needs: Patient Unable To Answer (07/22/2024)  Utilities: Patient Unable To Answer (07/22/2024)  Tobacco Use: Low Risk (07/20/2024)    Readmission Risk Interventions    07/24/2024    8:55 AM  Readmission Risk Prevention Plan  Post Dischage Appt Complete  Medication Screening Complete  Transportation Screening Complete

## 2024-08-03 NOTE — Progress Notes (Signed)
°   08/03/24 2255  BiPAP/CPAP/SIPAP  BiPAP/CPAP/SIPAP Pt Type Adult  BiPAP/CPAP/SIPAP Resmed  Mask Type Nasal pillows  Dentures removed? Not applicable  FiO2 (%) 21 %  Patient Home Machine Yes  Safety Check Completed by RT for Home Unit Yes, no issues noted  Patient Home Mask Yes  Patient Home Tubing Yes  Device Plugged into RED Power Outlet Yes

## 2024-08-04 DIAGNOSIS — E1011 Type 1 diabetes mellitus with ketoacidosis with coma: Secondary | ICD-10-CM | POA: Diagnosis not present

## 2024-08-04 LAB — BASIC METABOLIC PANEL WITH GFR
Anion gap: 12 (ref 5–15)
BUN: <5 mg/dL — ABNORMAL LOW (ref 6–20)
CO2: 25 mmol/L (ref 22–32)
Calcium: 8.2 mg/dL — ABNORMAL LOW (ref 8.9–10.3)
Chloride: 103 mmol/L (ref 98–111)
Creatinine, Ser: 0.77 mg/dL (ref 0.61–1.24)
GFR, Estimated: >60 mL/min
Glucose, Bld: 230 mg/dL — ABNORMAL HIGH (ref 70–99)
Potassium: 3.5 mmol/L (ref 3.5–5.1)
Sodium: 140 mmol/L (ref 135–145)

## 2024-08-04 LAB — GLUCOSE, CAPILLARY
Glucose-Capillary: 198 mg/dL — ABNORMAL HIGH (ref 70–99)
Glucose-Capillary: 263 mg/dL — ABNORMAL HIGH (ref 70–99)
Glucose-Capillary: 233 mg/dL — ABNORMAL HIGH (ref 70–99)

## 2024-08-04 LAB — CULTURE, BLOOD (ROUTINE X 2): Culture  Setup Time: NO GROWTH

## 2024-08-04 LAB — CBC
HCT: 33 % — ABNORMAL LOW (ref 39.0–52.0)
Hemoglobin: 11.2 g/dL — ABNORMAL LOW (ref 13.0–17.0)
MCH: 31.1 pg (ref 26.0–34.0)
MCHC: 33.9 g/dL (ref 30.0–36.0)
MCV: 91.7 fL (ref 80.0–100.0)
Platelets: 270 K/uL (ref 150–400)
RBC: 3.6 MIL/uL — ABNORMAL LOW (ref 4.22–5.81)
RDW: 13.6 % (ref 11.5–15.5)
WBC: 3.2 K/uL — ABNORMAL LOW (ref 4.0–10.5)
nRBC: 0 % (ref 0.0–0.2)

## 2024-08-04 MED ORDER — POTASSIUM CHLORIDE CRYS ER 20 MEQ PO TBCR
40.0000 meq | EXTENDED_RELEASE_TABLET | Freq: Once | ORAL | Status: AC
  Administered 2024-08-04: 40 meq via ORAL
  Filled 2024-08-04: qty 2

## 2024-08-04 NOTE — Progress Notes (Signed)
 Physical Therapy Treatment Patient Details Name: Jorge Calhoun MRN: [id number] DOB: 04-24-1966 Today's Date: 08/04/2024   History of Present Illness Jorge Calhoun is a 58 y/o M who presented on 07/20/24 for DKA likely in the setting of rhinovirus URTI after being found down at home.  During hospitalization- 12/5 decline in respiratory and mentation requiring intubation, 12/9 extubated, 12/10 improving but then SOB when OOB and found to have massive bil PE requiring thrombectomy, 12/11-12/12 levophed  weaned . PMH significant for type I DM, HTN, hyperthyroidism    PT Comments  Noting AIR was denied by pt's insurance.  Pt is making gradual progress but is still concerned as he would be home alone at times and has a flight of steps to navigate in his home.  Pt reports is unable to stay on first floor (no spare bedroom or recliner).  He is interested in SNF rehab at d/c.  Today, pt ambulating 78' with RW and CGA but rest breaks.  He performed 2 steps but did so with difficulty and assist.  Pt needs further progress prior to return home. Patient will benefit from continued inpatient follow up therapy, <3 hours/day at d/c.     If plan is discharge home, recommend the following: A little help with walking and/or transfers;A little help with bathing/dressing/bathroom;Assistance with cooking/housework;Assist for transportation;Help with stairs or ramp for entrance   Can travel by private vehicle     Yes  Equipment Recommendations  Rolling walker (2 wheels)    Recommendations for Other Services       Precautions / Restrictions Precautions Precautions: Fall     Mobility  Bed Mobility Overal bed mobility: Needs Assistance Bed Mobility: Supine to Sit     Supine to sit: Supervision, HOB elevated, Used rails          Transfers Overall transfer level: Needs assistance Equipment used: Rolling walker (2 wheels) Transfers: Sit to/from Stand Sit to Stand: Contact guard assist            General transfer comment: CGA for safety; performed x 2    Ambulation/Gait Ambulation/Gait assistance: Contact guard assist Gait Distance (Feet): 75 Feet (75' then 30') Assistive device: Rolling walker (2 wheels) Gait Pattern/deviations: Step-to pattern, Decreased stride length Gait velocity: decreased     General Gait Details: Decreased speed and fatigued easily needing rest breaks.  Denies any dizziness.  Pt on RA with sats >95% throughout.  HR 90's throughout   Stairs Stairs: Yes Stairs assistance: Min assist     General stair comments: Performed 4 platform step x 2.  Pt fatigued easily, cues for safey, labored effort to step up and min A to steady   Wheelchair Mobility     Tilt Bed    Modified Rankin (Stroke Patients Only)       Balance Overall balance assessment: Needs assistance Sitting-balance support: No upper extremity supported, Feet supported Sitting balance-Leahy Scale: Good     Standing balance support: Bilateral upper extremity supported, During functional activity, Reliant on assistive device for balance Standing balance-Leahy Scale: Poor Standing balance comment: used RW                            Communication    Cognition Arousal: Alert Behavior During Therapy: WFL for tasks assessed/performed                           PT - Cognition Comments: slow  processing        Cueing    Exercises      General Comments General comments (skin integrity, edema, etc.): Pt expressed concern about stairs and being alone at times at home - interested in SNF since AIR was denied by ins      Pertinent Vitals/Pain Pain Assessment Pain Assessment: No/denies pain    Home Living                          Prior Function            PT Goals (current goals can now be found in the care plan section) Progress towards PT goals: Progressing toward goals    Frequency    Min 3X/week      PT Plan       Co-evaluation              AM-PAC PT 6 Clicks Mobility   Outcome Measure  Help needed turning from your back to your side while in a flat bed without using bedrails?: A Little Help needed moving from lying on your back to sitting on the side of a flat bed without using bedrails?: A Little Help needed moving to and from a bed to a chair (including a wheelchair)?: A Little Help needed standing up from a chair using your arms (e.g., wheelchair or bedside chair)?: A Little Help needed to walk in hospital room?: A Little Help needed climbing 3-5 steps with a railing? : A Lot 6 Click Score: 17    End of Session Equipment Utilized During Treatment: Gait belt Activity Tolerance: Patient tolerated treatment well Patient left: Other (comment) (Sitting EOB, needs in reach, family present, steady, on 4th floor so bed alarm is on) Nurse Communication: Mobility status PT Visit Diagnosis: Unsteadiness on feet (R26.81);Muscle weakness (generalized) (M62.81);Difficulty in walking, not elsewhere classified (R26.2)     Time: 8774-8748 PT Time Calculation (min) (ACUTE ONLY): 26 min  Charges:    $Gait Training: 23-37 mins PT General Charges $$ ACUTE PT VISIT: 1 Visit                     Benjiman, PT Acute Rehab Services [facility] [phone]    [name] Mulberry 08/04/2024, 1:54 PM

## 2024-08-04 NOTE — NC FL2 (Cosign Needed)
 " [place]  MEDICAID FL2 LEVEL OF CARE FORM     IDENTIFICATION  Patient Name: Jorge Calhoun Birthdate: 16-Sep-1965 Sex: male Admission Date (Current Location): 07/20/2024  [facility] and [place] Number:  Producer, Television/film/video and Address:  [facility],  [address], [place] [address]      Provider Number: [id number]  Attending Physician Name and Address:  Madelyne Owen LABOR, MD  Relative Name and Phone Number:  Arland Gangi(spouse)[phone]    Current Level of Care: Hospital Recommended Level of Care: Skilled Nursing Facility Prior Approval Number:    Date Approved/Denied:   PASRR Number: [id number]  Discharge Plan: SNF    Current Diagnoses: Patient Active Problem List   Diagnosis Date Noted   Acute saddle pulmonary embolism with acute cor pulmonale (HCC) 07/26/2024   DKA, type 1 (HCC) 07/21/2024   DKA (diabetic ketoacidosis) (HCC) 07/20/2024   HTN (hypertension) 06/30/2021   PSVT (paroxysmal supraventricular tachycardia)     Orientation RESPIRATION BLADDER Height & Weight     Self, Time, Situation, Place  Normal Continent Weight: 87.5 kg Height:  5' 11 (180.3 cm)  BEHAVIORAL SYMPTOMS/MOOD NEUROLOGICAL BOWEL NUTRITION STATUS      Continent Diet (Heart healthy)  AMBULATORY STATUS COMMUNICATION OF NEEDS Skin   Limited Assist Verbally Normal                       Personal Care Assistance Level of Assistance  Bathing, Feeding, Dressing Bathing Assistance: Limited assistance Feeding assistance: Limited assistance Dressing Assistance: Limited assistance     Functional Limitations Info  Sight, Hearing, Speech Sight Info: Impaired (eyeglasses)   Speech Info: Adequate    SPECIAL CARE FACTORS FREQUENCY  PT (By licensed PT), OT (By licensed OT)     PT Frequency: 5x week OT Frequency: 5x week            Contractures Contractures Info: Not present    Additional Factors Info  Code Status, Allergies Code Status Info:  Full Allergies Info: Jardiance  (Empagliflozin ), Metformin And Related           Current Medications (08/04/2024):  This is the current hospital active medication list Current Facility-Administered Medications  Medication Dose Route Frequency Provider Last Rate Last Admin   acetaminophen  (TYLENOL ) 160 MG/5ML solution 650 mg  650 mg Oral Q6H PRN Alghanim, Fahid, MD   650 mg at 07/25/24 2047   apixaban  (ELIQUIS ) tablet 10 mg  10 mg Oral BID Pham, Anh P, RPH   10 mg at 08/04/24 1021   Followed by   NOREEN ON 08/07/2024] apixaban  (ELIQUIS ) tablet 5 mg  5 mg Oral BID Pham, Anh P, RPH       Chlorhexidine  Gluconate Cloth 2 % PADS 6 each  6 each Topical Daily Claudene Toribio BROCKS, MD   6 each at 08/04/24 1022   dextrose  50 % solution 0-50 mL  0-50 mL Intravenous PRN Floyd, Dan, DO       diltiazem  (CARDIZEM  CD) 24 hr capsule 120 mg  120 mg Oral Daily Regalado, Belkys A, MD   120 mg at 08/04/24 1021   docusate (COLACE) 50 MG/5ML liquid 100 mg  100 mg Oral BID Alghanim, Fahid, MD       feeding supplement (GLUCERNA SHAKE) (GLUCERNA SHAKE) liquid 237 mL  237 mL Oral BID BM Regalado, Belkys A, MD   237 mL at 08/03/24 1000   guaiFENesin  (MUCINEX ) 12 hr tablet 600 mg  600 mg Oral BID PRN  Antonetta Vina NOVAK, NP       insulin  aspart (novoLOG ) injection 0-9 Units  0-9 Units Subcutaneous TID WC Regalado, Belkys A, MD   5 Units at 08/04/24 1624   insulin  aspart (novoLOG ) injection 4 Units  4 Units Subcutaneous TID WC Regalado, Belkys A, MD   4 Units at 08/04/24 1624   insulin  glargine (LANTUS ) injection 10 Units  10 Units Subcutaneous BID Regalado, Belkys A, MD   10 Units at 08/03/24 2111   labetalol  (NORMODYNE ) injection 10 mg  10 mg Intravenous Q10 min PRN Rosan Deward ORN, NP   10 mg at 07/28/24 1746   melatonin tablet 3 mg  3 mg Oral QHS PRN Simpson, Paula B, NP       metoprolol  tartrate (LOPRESSOR ) tablet 25 mg  25 mg Oral BID Rosan Deward ORN, NP   25 mg at 08/04/24 1021   ondansetron  (ZOFRAN ) injection 4 mg  4 mg  Intravenous Q6H PRN Claudene Toribio BROCKS, MD       Oral care mouth rinse  15 mL Mouth Rinse PRN Olalere, Adewale A, MD       Oral care mouth rinse  15 mL Mouth Rinse PRN Alghanim, Fahid, MD       pneumococcal 20-valent conjugate vaccine (PREVNAR 20 ) injection 0.5 mL  0.5 mL Intramuscular Tomorrow-1000 Alghanim, Fahid, MD       polyethylene glycol (MIRALAX  / GLYCOLAX ) packet 17 g  17 g Oral Daily Alghanim, Fahid, MD       sodium chloride  flush (NS) 0.9 % injection 10-40 mL  10-40 mL Intracatheter PRN Alghanim, Fahid, MD   10 mL at 08/02/24 0900   sodium chloride  flush (NS) 0.9 % injection 10-40 mL  10-40 mL Intracatheter Q12H Alghanim, Fahid, MD   10 mL at 08/01/24 2157     Discharge Medications: Please see discharge summary for a list of discharge medications.  Relevant Imaging Results:  Relevant Lab Results:   Additional Information ss#[id number]  Chara Marquard, Nathanel, RN     "

## 2024-08-04 NOTE — Progress Notes (Signed)
 " PROGRESS NOTE    Jorge Calhoun  FMW:[id number] DOB: October 01, 1965 DOA: 07/20/2024 PCP: Seabron Lenis, MD   Brief Narrative: 58 year old with past medical history significant for hypertension, nontoxic multinodular goiter, obesity, PSVT, type 1 diabetes who was found down unresponsive at home on 12/4.  He presented to the [facility] 12/3 after 3 to 4 days of feeling generally unwell, poor oral intake.  Per wife he received something for an infection.  On the day of admission he was found unresponsive at home.  On EMS arrival he was hypotensive systolic blood pressure in the 80s oxygen saturation 100% on 2 L CBG 400.  He was found to be in DKA, hyperglycemia, received fluids and insulin  drip.  He was found to have rhinovirus positive.  He was intubated for airway protection in the setting of altered mental status.  Hospital course: 12/4 Admit in DKA, RVP panel positive for rhinovirus/enterovirus 12/5 Progressive decline in mentation with eventual development of unresponsiveness intubated for airway protection with central line and A-line placed as well 12/9, extubated  12/10 was doing well pending tx out of ICU> acute SOB when OOB> massive bilateral PE with RV dysfunction/ shock> IR thrombectomy, heparin , NE 12/11 - 12/12 --> euglycemic DKA on insulin  drip, levo weaned off 12/15 transition to Eliquis .   Assessment & Plan:   Principal Problem:   DKA (diabetic ketoacidosis) (HCC) Active Problems:   DKA, type 1 (HCC)   Acute saddle pulmonary embolism with acute cor pulmonale (HCC)   1-Acute hypoxic respiratory failure -Massive bilateral PE with cor pulmonale status post IR thrombectomy 12/10 -Obstructive shock secondary to PE: Resolved - Patient initially intubated 12/5 for airway protection.  Subsequently extubated 12/9. - Patient developed acute shortness of breath CT angio showed massive bilateral PE with RV dysfunction and shock, IR performed thrombectomy. -He has been transition to Eliquis   12/15 -Stable.   MSSA pneumonia Rhinovirus infection 1 out of 4 positive for strep Viridians;  Completed  10 of antibiotics Repeated blood cultures 12/13 no growth today.  AKI In the setting of hypotension and IV contrast, diuretics.  Resolved  Anion gap metabolic acidosis: received extra dose insulin  and IV fluids. Resolved.   Hypernatremia: resolved.  Encourage oral intake  Hypokalemia Replete   DKA type 1 diabetes Received insulin  drip Beta hydroxybutyric acid trending down. On lantus  to 10 BID. Meal coverage 4 units.  Further adjustments as needed.   Hypertension Continue with metoprolol  twice daily Resume lower dose home Cardizem .  Hyperlipidemia   Hypophosphatemia: Replaced.    See wound care documentation below  Wound 07/27/24 2100 Pressure Injury Heel Right Deep Tissue Pressure Injury - Purple or maroon localized area of discolored intact skin or blood-filled blister due to damage of underlying soft tissue from pressure and/or shear. (Active)     Nutrition Problem: Inadequate oral intake Etiology: inability to eat    Signs/Symptoms: NPO status    Interventions: Ensure Enlive (each supplement provides 350kcal and 20 grams of protein)  Estimated body mass index is 26.9 kg/m as calculated from the following:   Height as of this encounter: 5' 11 (1.803 m).   Weight as of this encounter: 87.5 kg.   DVT prophylaxis: Heparin  drip Code Status: Full code Family Communication: Family at bedside Disposition Plan:  Status is: Inpatient Remains inpatient appropriate because: stable for CIR, patient will benefit from CIR admission.     Consultants:  Pulmonologist  Procedures:  Thrombectomy  Antimicrobials:  IV ceftriaxone   Subjective: He is alert, denies  pain. Awaiting disposition.   Objective: Vitals:   08/04/24 0413 08/04/24 0438 08/04/24 1020 08/04/24 1436  BP:  119/70 134/77 116/72  Pulse:  93  95  Resp:  12  16  Temp:  98.1 F  (36.7 C)  98.3 F (36.8 C)  TempSrc:  Oral  Oral  SpO2:  98%  97%  Weight: 87.5 kg     Height:        Intake/Output Summary (Last 24 hours) at 08/04/2024 1441 Last data filed at 08/04/2024 1038 Gross per 24 hour  Intake 570 ml  Output --  Net 570 ml   Filed Weights   08/02/24 0532 08/03/24 0647 08/04/24 0413  Weight: 98.3 kg 98.2 kg 87.5 kg    Examination:  General exam: NAD Respiratory system: CTA Cardiovascular system: S 1, S 2 RRR Gastrointestinal system: BS present, soft, nt Central nervous system: alert Extremities: no edema   Data Reviewed: I have personally reviewed following labs and imaging studies  CBC: Recent Labs  Lab 07/29/24 0536 07/30/24 0153 07/31/24 0155 08/04/24 0545  WBC 5.7 6.1 6.2 3.2*  HGB 10.8* 10.8* 10.9* 11.2*  HCT 33.5* 32.1* 32.6* 33.0*  MCV 97.4 93.9 93.9 91.7  PLT 215 234 200 270   Basic Metabolic Panel: Recent Labs  Lab 07/28/24 1640 07/29/24 0536 07/29/24 1005 07/30/24 0820 07/31/24 0155 08/01/24 1012 08/02/24 0900 08/02/24 1557 08/03/24 0537 08/04/24 0545  NA  --   --    < >  --  142 140 139 137 139 140  K  --   --    < >  --  3.0* 3.5 3.3* 4.0 3.4* 3.5  CL  --   --    < >  --  104 103 101 103 103 103  CO2  --   --    < >  --  23 20* 19* 17* 21* 25  GLUCOSE  --   --    < >  --  139* 253* 294* 194* 236* 230*  BUN  --   --    < >  --  7 6 6 6  <5* <5*  CREATININE  --   --    < >  --  0.69 0.81 0.76 0.73 0.70 0.77  CALCIUM   --   --    < >  --  7.6* 8.4* 8.3* 7.9* 8.1* 8.2*  MG 2.5* 2.2  --   --   --   --   --   --   --   --   PHOS  --  1.9*  --  2.1* 2.6  --   --   --   --   --    < > = values in this interval not displayed.   GFR: Estimated Creatinine Clearance: 105.9 mL/min (by C-G formula based on SCr of 0.77 mg/dL). Liver Function Tests: No results for input(s): AST, ALT, ALKPHOS, BILITOT, PROT, ALBUMIN in the last 168 hours.  No results for input(s): LIPASE, AMYLASE in the last 168 hours. No  results for input(s): AMMONIA in the last 168 hours. Coagulation Profile: No results for input(s): INR, PROTIME in the last 168 hours.  Cardiac Enzymes: No results for input(s): CKTOTAL, CKMB, CKMBINDEX, TROPONINI in the last 168 hours. BNP (last 3 results) Recent Labs    07/26/24 1902  PROBNP 605.0*   HbA1C: No results for input(s): HGBA1C in the last 72 hours. CBG: Recent Labs  Lab 08/03/24 0749 08/03/24 1128 08/03/24 1630  08/03/24 1947 08/04/24 0744  GLUCAP 229* 181* 128* 121* 198*   Lipid Profile: No results for input(s): CHOL, HDL, LDLCALC, TRIG, CHOLHDL, LDLDIRECT in the last 72 hours. Thyroid  Function Tests: No results for input(s): TSH, T4TOTAL, FREET4, T3FREE, THYROIDAB in the last 72 hours. Anemia Panel: No results for input(s): VITAMINB12, FOLATE, FERRITIN, TIBC, IRON, RETICCTPCT in the last 72 hours. Sepsis Labs: No results for input(s): PROCALCITON, LATICACIDVEN in the last 168 hours.   Recent Results (from the past 240 hours)  Culture, blood (Routine X 2) w Reflex to ID Panel     Status: None   Collection Time: 07/29/24 12:37 PM   Specimen: BLOOD LEFT ARM  Result Value Ref Range Status   Specimen Description   Final    BLOOD LEFT ARM Performed at [facility], [address] [place], [place] [address]    Special Requests   Final    BOTTLES DRAWN AEROBIC ONLY Blood Culture adequate volume Performed at [facility], [address] [place], [place] [address]    Culture   Final    NO GROWTH 5 DAYS Performed at [facility], [address], [place], [place] [address]    Report Status 08/03/2024 FINAL  Final  Culture, blood (Routine X 2) w Reflex to ID Panel     Status: None   Collection Time: 07/29/24 12:48 PM   Specimen: BLOOD LEFT ARM  Result Value Ref Range Status   Specimen Description   Final    BLOOD LEFT ARM Performed at [facility], [address], [place], [place] [address]    Special Requests   Final    BOTTLES DRAWN AEROBIC ONLY Blood Culture results may not be optimal due to an inadequate volume of blood received in culture bottles Performed at [facility], [address], [place], [place] [address]    Culture   Final    NO GROWTH 5 DAYS Performed at [facility], [address], [place], [place] [address]    Report Status 08/03/2024 FINAL  Final         Radiology Studies: No results found.       Scheduled Meds:  apixaban   10 mg Oral BID   Followed by   NOREEN ON 08/07/2024] apixaban   5 mg Oral BID   Chlorhexidine  Gluconate Cloth  6 each Topical Daily   diltiazem   120 mg Oral Daily   docusate  100 mg Oral BID   feeding supplement (GLUCERNA SHAKE)  237 mL Oral BID BM   insulin  aspart  0-9 Units Subcutaneous TID WC   insulin  aspart  4 Units Subcutaneous TID WC   insulin  glargine  10 Units Subcutaneous BID   metoprolol  tartrate  25 mg Oral BID   pneumococcal 20-valent conjugate vaccine  0.5 mL Intramuscular Tomorrow-1000   polyethylene glycol  17 g Oral Daily   potassium chloride   40 mEq Oral Once   sodium chloride  flush  10-40 mL Intracatheter Q12H   Continuous Infusions:      LOS: 15 days    Time spent: 35 Minutes    Ulyess Muto A Mikaeel Petrow, MD [facility]   If 7PM-7AM, please contact night-coverage [email or web address]  08/04/2024, 2:41 PM   "

## 2024-08-04 NOTE — TOC Progression Note (Signed)
 Transition of Care [facility]) - Progression Note    Patient Details  Name: Jorge Calhoun MRN: [id number] Date of Birth: [birth year]  Transition of Care [facility]) CM/SW Contact  Camelia JONETTA Cary, RN Phone Number: 08/04/2024, 2:58 PM  Clinical Narrative:   Spoke to pt. Regarding short term Rehab. Pt. Agreed to receive rehab at short term SNF. Wears glasses.    Expected Discharge Plan: IP Rehab Facility Barriers to Discharge: Continued Medical Work up               Expected Discharge Plan and Services   Discharge Planning Services: CM Consult Post Acute Care Choice: NA Living arrangements for the past 2 months: Single Family Home                                       Social Drivers of Health (SDOH) Interventions SDOH Screenings   Food Insecurity: No Food Insecurity (07/22/2024)  Housing: Low Risk (07/22/2024)  Transportation Needs: Patient Unable To Answer (07/22/2024)  Utilities: Patient Unable To Answer (07/22/2024)  Tobacco Use: Low Risk (07/20/2024)    Readmission Risk Interventions    07/24/2024    8:55 AM  Readmission Risk Prevention Plan  Post Dischage Appt Complete  Medication Screening Complete  Transportation Screening Complete

## 2024-08-04 NOTE — TOC Progression Note (Signed)
 Transition of Care [facility]) - Progression Note    Patient Details  Name: Jorge Calhoun MRN: [id number] Date of Birth: [birth year]  Transition of Care [facility]) CM/SW Contact  Brinlynn Gorton, Nathanel, RN Phone Number: 08/04/2024, 4:46 PM  Clinical Narrative:Patient agreed to ST SNF-faxed out await bed offers, choice,prior auth.       Expected Discharge Plan: Skilled Nursing Facility Barriers to Discharge: Continued Medical Work up               Expected Discharge Plan and Services   Discharge Planning Services: CM Consult Post Acute Care Choice: NA Living arrangements for the past 2 months: Single Family Home                                       Social Drivers of Health (SDOH) Interventions SDOH Screenings   Food Insecurity: No Food Insecurity (07/22/2024)  Housing: Low Risk (07/22/2024)  Transportation Needs: Patient Unable To Answer (07/22/2024)  Utilities: Patient Unable To Answer (07/22/2024)  Tobacco Use: Low Risk (07/20/2024)    Readmission Risk Interventions    07/24/2024    8:55 AM  Readmission Risk Prevention Plan  Post Dischage Appt Complete  Medication Screening Complete  Transportation Screening Complete

## 2024-08-04 NOTE — Progress Notes (Signed)
" °   08/04/24 2229  BiPAP/CPAP/SIPAP  BiPAP/CPAP/SIPAP Pt Type Adult  BiPAP/CPAP/SIPAP Resmed (from home)  Mask Type Nasal pillows  Dentures removed? Not applicable  FiO2 (%) 21 %  Heater Temperature  (sterile water  provided)  Patient Home Machine Yes  Safety Check Completed by RT for Home Unit Yes, no issues noted  Patient Home Mask Yes  Patient Home Tubing Yes  Auto Titrate Yes  Minimum cmH2O 5 cmH2O  Maximum cmH2O 15 cmH2O  Device Plugged into RED Power Outlet Yes  BiPAP/CPAP /SiPAP Vitals  Pulse Rate 94  Resp 15  SpO2 100 %  Bilateral Breath Sounds Clear  MEWS Score/Color  MEWS Score 0  MEWS Score Color Green    "

## 2024-08-05 DIAGNOSIS — E1011 Type 1 diabetes mellitus with ketoacidosis with coma: Secondary | ICD-10-CM | POA: Diagnosis not present

## 2024-08-05 LAB — GLUCOSE, CAPILLARY
Glucose-Capillary: 225 mg/dL — ABNORMAL HIGH (ref 70–99)
Glucose-Capillary: 230 mg/dL — ABNORMAL HIGH (ref 70–99)
Glucose-Capillary: 301 mg/dL — ABNORMAL HIGH (ref 70–99)
Glucose-Capillary: 271 mg/dL — ABNORMAL HIGH (ref 70–99)

## 2024-08-05 MED ORDER — INSULIN GLARGINE 100 UNIT/ML ~~LOC~~ SOLN
12.0000 [IU] | Freq: Two times a day (BID) | SUBCUTANEOUS | Status: DC
  Administered 2024-08-05 (×2): 12 [IU] via SUBCUTANEOUS
  Filled 2024-08-05 (×3): qty 0.12

## 2024-08-05 NOTE — Progress Notes (Signed)
 " PROGRESS NOTE    Jorge Calhoun  FMW:[id number] DOB: [birth year] DOA: 07/20/2024 PCP: Seabron Lenis, MD   Brief Narrative: 58 year old with past medical history significant for hypertension, nontoxic multinodular goiter, obesity, PSVT, type 1 diabetes who was found down unresponsive at home on 12/4.  He presented to the [facility] 12/3 after 3 to 4 days of feeling generally unwell, poor oral intake.  Per wife he received something for an infection.  On the day of admission he was found unresponsive at home.  On EMS arrival he was hypotensive systolic blood pressure in the 80s oxygen saturation 100% on 2 L CBG 400.  He was found to be in DKA, hyperglycemia, received fluids and insulin  drip.  He was found to have rhinovirus positive.  He was intubated for airway protection in the setting of altered mental status.  Hospital course: 12/4 Admit in DKA, RVP panel positive for rhinovirus/enterovirus 12/5 Progressive decline in mentation with eventual development of unresponsiveness intubated for airway protection with central line and A-line placed as well 12/9, extubated  12/10 was doing well pending tx out of ICU> acute SOB when OOB> massive bilateral PE with RV dysfunction/ shock> IR thrombectomy, heparin , NE 12/11 - 12/12 --> euglycemic DKA on insulin  drip, levo weaned off 12/15 transition to Eliquis .  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  Subsequently extubated 12/9. - Patient developed acute shortness of breath CT angio showed massive bilateral PE with RV  dysfunction and shock, IR performed thrombectomy. -He has been transition to Eliquis  12/15 -Stable.  Currently on room air  MSSA pneumonia Rhinovirus infection 1 out of 4 positive for strep Viridians;  Completed  10 of antibiotics Repeated blood cultures 12/13 no growth today.  AKI In the setting of hypotension and IV contrast, diuretics.  Resolved  Anion gap metabolic acidosis: received extra dose insulin  and IV fluids. Resolved.   Hypernatremia: resolved.  Encourage oral intake  Hypokalemia Replete   DKA type 1 diabetes Received insulin  drip Beta hydroxybutyric acid trending down. Lantus  increased to 12 units twice daily (12/20), continue 4 units meal coverage Continue sliding scale insulin  Further adjustments as needed.   Hypertension Continue with metoprolol  twice daily Resume lower dose home Cardizem .  Hyperlipidemia   Hypophosphatemia: Replaced.    See wound care documentation below  Wound 07/27/24 2100 Pressure Injury Heel Right Deep Tissue Pressure Injury - Purple or maroon localized area of discolored intact skin or blood-filled blister due to damage of underlying soft tissue from pressure and/or shear. (Active)     Nutrition Problem: Inadequate oral intake Etiology: inability to eat    Signs/Symptoms: NPO status    Interventions: Ensure Enlive (each supplement provides 350kcal and 20 grams of protein)  Estimated body mass index is 30.16 kg/m as calculated from the following:   Height as of this encounter: 5' 11 (1.803 m).   Weight as of this encounter: 98.1 kg.   DVT prophylaxis: Heparin  drip Code Status: Full code Family Communication: Family at bedside Disposition Plan:  Status is: Inpatient Remains inpatient appropriate because: Insurance denied CIR.  Social engineer, technical sales information for  skilled nursing facility   Consultants:  Pulmonologist  Procedures:  Thrombectomy  Antimicrobials:  IV ceftriaxone   Subjective: He is  sitting in the recliner, he denies chest pain or shortness of breath.  He has been doing okay.  Denies  constipation.  Objective: Vitals:   08/04/24 1436 08/04/24 1957 08/04/24 2229 08/05/24 0435  BP: 116/72 (!) 132/92  106/60  Pulse: 95 96 94 80  Resp: 16 12 15 16   Temp: 98.3 F (36.8 C) 98.3 F (36.8 C)  98.9 F (37.2 C)  TempSrc: Oral   Oral  SpO2: 97% 100% 100% 97%  Weight:    98.1 kg  Height:        Intake/Output Summary (Last 24 hours) at 08/05/2024 0701 Last data filed at 08/05/2024 0437 Gross per 24 hour  Intake 520 ml  Output 475 ml  Net 45 ml   Filed Weights   08/03/24 0647 08/04/24 0413 08/05/24 0435  Weight: 98.2 kg 87.5 kg 98.1 kg    Examination:  General exam: NAD Respiratory system: Clear to auscultation Cardiovascular system: S1-S2 regular rhythm and rate Gastrointestinal system: Bowel sounds present, soft nontender nondistended Central nervous system: Alert, follows commands move all 4 extremities. Extremities: no edema   Data Reviewed: I have personally reviewed following labs and imaging studies  CBC: Recent Labs  Lab 07/30/24 0153 07/31/24 0155 08/04/24 0545  WBC 6.1 6.2 3.2*  HGB 10.8* 10.9* 11.2*  HCT 32.1* 32.6* 33.0*  MCV 93.9 93.9 91.7  PLT 234 200 270   Basic Metabolic Panel: Recent Labs  Lab 07/30/24 0820 07/31/24 0155 08/01/24 1012 08/02/24 0900 08/02/24 1557 08/03/24 0537 08/04/24 0545  NA  --  142 140 139 137 139 140  K  --  3.0* 3.5 3.3* 4.0 3.4* 3.5  CL  --  104 103 101 103 103 103  CO2  --  23 20* 19* 17* 21* 25  GLUCOSE  --  139* 253* 294* 194* 236* 230*  BUN  --  7 6 6 6  <5* <5*  CREATININE  --  0.69 0.81 0.76 0.73 0.70 0.77  CALCIUM   --  7.6* 8.4* 8.3* 7.9* 8.1* 8.2*  PHOS 2.1* 2.6  --   --   --   --   --    GFR: Estimated Creatinine Clearance: 118.7 mL/min (by C-G formula based on SCr of 0.77 mg/dL). Liver Function Tests: No results for input(s): AST, ALT, ALKPHOS, BILITOT, PROT, ALBUMIN in  the last 168 hours.  No results for input(s): LIPASE, AMYLASE in the last 168 hours. No results for input(s): AMMONIA in the last 168 hours. Coagulation Profile: No results for input(s): INR, PROTIME in the last 168 hours.  Cardiac Enzymes: No results for input(s): CKTOTAL, CKMB, CKMBINDEX, TROPONINI in the last 168 hours. BNP (last 3 results) Recent Labs    07/26/24 1902  PROBNP 605.0*   HbA1C: No results for input(s): HGBA1C in the last 72 hours. CBG: Recent Labs  Lab 08/03/24 1630 08/03/24 1947 08/04/24 0744 08/04/24 1618 08/04/24 1959  GLUCAP 128* 121* 198* 263* 233*   Lipid Profile: No results for input(s): CHOL, HDL, LDLCALC, TRIG, CHOLHDL, LDLDIRECT in the last 72 hours. Thyroid  Function Tests: No results for input(s): TSH, T4TOTAL, FREET4, T3FREE, THYROIDAB in the last 72 hours. Anemia Panel: No results for input(s): VITAMINB12, FOLATE, FERRITIN, TIBC, IRON, RETICCTPCT in the last 72 hours. Sepsis Labs: No results for input(s): PROCALCITON, LATICACIDVEN in the last 168 hours.   Recent Results (from the past 240  hours)  Culture, blood (Routine X 2) w Reflex to ID Panel     Status: None   Collection Time: 07/29/24 12:37 PM   Specimen: BLOOD LEFT ARM  Result Value Ref Range Status   Specimen Description   Final    BLOOD LEFT ARM Performed at [facility], [address] [place], [place] [address]    Special Requests   Final    BOTTLES DRAWN AEROBIC ONLY Blood Culture adequate volume Performed at [facility], [address] [place], [place] [address]    Culture   Final    NO GROWTH 5 DAYS Performed at [facility], [address], [place], [place] [address]    Report Status 08/03/2024 FINAL  Final  Culture, blood (Routine X 2) w Reflex to ID Panel     Status: None   Collection Time: 07/29/24 12:48 PM   Specimen: BLOOD LEFT ARM  Result Value Ref Range Status    Specimen Description   Final    BLOOD LEFT ARM Performed at [facility], [address], [place], [place] [address]    Special Requests   Final    BOTTLES DRAWN AEROBIC ONLY Blood Culture results may not be optimal due to an inadequate volume of blood received in culture bottles Performed at [facility], [address] [place], [place] [address]    Culture   Final    NO GROWTH 5 DAYS Performed at [facility], [address], [place], [place] [address]    Report Status 08/03/2024 FINAL  Final         Radiology Studies: No results found.       Scheduled Meds:  apixaban   10 mg Oral BID   Followed by   NOREEN ON 08/07/2024] apixaban   5 mg Oral BID   Chlorhexidine  Gluconate Cloth  6 each Topical Daily   diltiazem   120 mg Oral Daily   docusate  100 mg Oral BID   feeding supplement (GLUCERNA SHAKE)  237 mL Oral BID BM   insulin  aspart  0-9 Units Subcutaneous TID WC   insulin  aspart  4 Units Subcutaneous TID WC   insulin  glargine  10 Units Subcutaneous BID   metoprolol  tartrate  25 mg Oral BID   pneumococcal 20-valent conjugate vaccine  0.5 mL Intramuscular Tomorrow-1000   polyethylene glycol  17 g Oral Daily   sodium chloride  flush  10-40 mL Intracatheter Q12H   Continuous Infusions:      LOS: 16 days    Time spent: 35 Minutes    Ellianah Cordy A Harneet Noblett, MD [facility]   If 7PM-7AM, please contact night-coverage [email or web address]  08/05/2024, 7:01 AM   "

## 2024-08-05 NOTE — Plan of Care (Signed)
   Problem: Education: Goal: Knowledge of General Education information will improve Description Including pain rating scale, medication(s)/side effects and non-pharmacologic comfort measures Outcome: Progressing   Problem: Health Behavior/Discharge Planning: Goal: Ability to manage health-related needs will improve Outcome: Progressing   Problem: Clinical Measurements: Goal: Ability to maintain clinical measurements within normal limits will improve Outcome: Progressing Goal: Will remain free from infection Outcome: Progressing Goal: Diagnostic test results will improve Outcome: Progressing Goal: Respiratory complications will improve Outcome: Progressing Goal: Cardiovascular complication will be avoided Outcome: Progressing   Problem: Elimination: Goal: Will not experience complications related to bowel motility Outcome: Progressing Goal: Will not experience complications related to urinary retention Outcome: Progressing

## 2024-08-05 NOTE — Progress Notes (Signed)
 Mobility Specialist Progress Note:   08/05/24 1016  Mobility  Activity Ambulated with assistance  Level of Assistance Contact guard assist, steadying assist  Assistive Device Front wheel walker  Distance Ambulated (ft) 130 ft  Activity Response Tolerated well  Mobility Referral Yes  Mobility visit 1 Mobility  Mobility Specialist Start Time (ACUTE ONLY) 0930  Mobility Specialist Stop Time (ACUTE ONLY) 0945  Mobility Specialist Time Calculation (min) (ACUTE ONLY) 15 min   Pt was received in recliner and agreed to mobility. No complaints/issues during session. Returned to recliner with all needs met. Call bell in reach.  [employer] - Mobility Specialist

## 2024-08-05 NOTE — Progress Notes (Signed)
 Mobility Specialist Progress Note:   08/05/24 1405  Mobility  Activity Ambulated with assistance  Level of Assistance Contact guard assist, steadying assist  Assistive Device Front wheel walker  Distance Ambulated (ft) 120 ft  Activity Response Tolerated well  Mobility Referral Yes  Mobility visit 1 Mobility  Mobility Specialist Start Time (ACUTE ONLY) 1336  Mobility Specialist Stop Time (ACUTE ONLY) 1350  Mobility Specialist Time Calculation (min) (ACUTE ONLY) 14 min   Pt was received in recliner and agreed to mobility. No complaints/issues during ambulation. Returned to recliner with all needs met. Call bell in reach.  [employer] - Mobility Specialist

## 2024-08-05 NOTE — TOC Progression Note (Signed)
 Transition of Care [facility]) - Progression Note    Patient Details  Name: Jorge Calhoun MRN: [id number] Date of Birth: October 23, 1965  [facility] CM/SW Contact  Lorraine LILLETTE Fenton, LCSW Phone Number: 08/05/2024, 1:23 PM  Clinical Narrative:    MD checked in on SNF status via secure chat and CSW checked record.  Two bed offers, presented to pt along with the Medicare star rating.  Pt was undecided about which facility.  He agreed to speak with his wife, then move forward.  I agreed to follow up with him by 3 PM for a choice to move forward with authorization process.  Secure chat back to MD apprising her of this plan. ICM following.    Expected Discharge Plan: Skilled Nursing Facility Barriers to Discharge: Continued Medical Work up               Expected Discharge Plan and Services   Discharge Planning Services: CM Consult Post Acute Care Choice: NA Living arrangements for the past 2 months: Single Family Home                                       Social Drivers of Health (SDOH) Interventions SDOH Screenings   Food Insecurity: No Food Insecurity (07/22/2024)  Housing: Low Risk (07/22/2024)  Transportation Needs: Patient Unable To Answer (07/22/2024)  Utilities: Patient Unable To Answer (07/22/2024)  Tobacco Use: Low Risk (07/20/2024)    Readmission Risk Interventions    07/24/2024    8:55 AM  Readmission Risk Prevention Plan  Post Dischage Appt Complete  Medication Screening Complete  Transportation Screening Complete

## 2024-08-06 DIAGNOSIS — E1111 Type 2 diabetes mellitus with ketoacidosis with coma: Secondary | ICD-10-CM

## 2024-08-06 DIAGNOSIS — I2609 Other pulmonary embolism with acute cor pulmonale: Secondary | ICD-10-CM

## 2024-08-06 LAB — CBC
HCT: 35.4 % — ABNORMAL LOW (ref 39.0–52.0)
Hemoglobin: 12 g/dL — ABNORMAL LOW (ref 13.0–17.0)
MCH: 30.8 pg (ref 26.0–34.0)
MCHC: 33.9 g/dL (ref 30.0–36.0)
MCV: 91 fL (ref 80.0–100.0)
Platelets: 383 K/uL (ref 150–400)
RBC: 3.89 MIL/uL — ABNORMAL LOW (ref 4.22–5.81)
RDW: 14 % (ref 11.5–15.5)
WBC: 3.5 K/uL — ABNORMAL LOW (ref 4.0–10.5)
nRBC: 0 % (ref 0.0–0.2)

## 2024-08-06 LAB — BLOOD GAS, ARTERIAL
Acid-Base Excess: 7.7 mmol/L — ABNORMAL HIGH (ref 0.0–2.0)
Bicarbonate: 32.7 mmol/L — ABNORMAL HIGH (ref 20.0–28.0)
Drawn by: 75061
FIO2: 30 %
MECHVT: 520 mL
O2 Saturation: 98.7 %
PEEP: 5 cmH2O
Patient temperature: 37.5
RATE: 15 {breaths}/min
pCO2 arterial: 47 mmHg (ref 32–48)
pH, Arterial: 7.45 (ref 7.35–7.45)
pO2, Arterial: 81 mmHg — ABNORMAL LOW (ref 83–108)

## 2024-08-06 LAB — BASIC METABOLIC PANEL WITH GFR
Anion gap: 10 (ref 5–15)
BUN: <5 mg/dL — ABNORMAL LOW (ref 6–20)
CO2: 26 mmol/L (ref 22–32)
Calcium: 8.5 mg/dL — ABNORMAL LOW (ref 8.9–10.3)
Chloride: 107 mmol/L (ref 98–111)
Creatinine, Ser: 0.74 mg/dL (ref 0.61–1.24)
GFR, Estimated: >60 mL/min
Glucose, Bld: 243 mg/dL — ABNORMAL HIGH (ref 70–99)
Potassium: 3.6 mmol/L (ref 3.5–5.1)
Sodium: 142 mmol/L (ref 135–145)

## 2024-08-06 LAB — GLUCOSE, CAPILLARY
Glucose-Capillary: 216 mg/dL — ABNORMAL HIGH (ref 70–99)
Glucose-Capillary: 182 mg/dL — ABNORMAL HIGH (ref 70–99)
Glucose-Capillary: 94 mg/dL (ref 70–99)
Glucose-Capillary: 86 mg/dL (ref 70–99)
Glucose-Capillary: 117 mg/dL — ABNORMAL HIGH (ref 70–99)

## 2024-08-06 MED ORDER — INSULIN ASPART 100 UNIT/ML IJ SOLN
6.0000 [IU] | Freq: Three times a day (TID) | INTRAMUSCULAR | Status: DC
  Administered 2024-08-06 – 2024-08-13 (×19): 6 [IU] via SUBCUTANEOUS
  Filled 2024-08-06 (×17): qty 6

## 2024-08-06 MED ORDER — INSULIN GLARGINE 100 UNIT/ML ~~LOC~~ SOLN
16.0000 [IU] | Freq: Two times a day (BID) | SUBCUTANEOUS | Status: DC
  Administered 2024-08-06 – 2024-08-14 (×17): 16 [IU] via SUBCUTANEOUS
  Filled 2024-08-06 (×16): qty 0.16

## 2024-08-06 NOTE — Progress Notes (Addendum)
 " PROGRESS NOTE    Jorge Calhoun  FMW:[id number] DOB: 1966/07/04 DOA: 07/20/2024 PCP: Seabron Lenis, MD    Brief Narrative:   Jorge Calhoun is a 58 y.o. male with past medical history significant for HTN, PSVT, type 1 diabetes mellitus, nontoxic multinodular goiter, obesity who presented to [facility] ED on 07/20/2024 from home via EMS after being found unresponsive.  Apparently patient was feeling generally unwell over the previous 3-4 days with poor oral intake, nausea and vomiting; seen at the [place] and was given something for an infection.  On EMS arrival, patient was noted to be hypotensive with SBP in the 80s, oxygen saturation 100% on 2 L nasal cannula and CBG 400.  Patient was initially admitted to the intensive care unit and placed on insulin  drip for DKA.  Patient's mental status continued to decline and he was subsequently intubated for airway protection on 07/21/2024.  Patient was placed on vasopressors and heparin  drip for obstructive shock secondary to pulmonary embolism.  Interventional radiology was consulted and patient underwent pulmonary artery thrombectomy on 07/26/2024.  Patient was transition to Eliquis  on 07/31/2024.  Seen by PT and OT with initial recommendation for [facility] inpatient rehabilitation, unfortunately denied by [facility].  Currently awaiting SNF placement.  Significant hospital events: 12/4: Admit to ICU, DKA, RVP positive for rhinovirus/enterovirus 12/5: Progressive decline in mentation, intubated for airway protection, central line and A-line placed 12/9: Extubated, A-line removed 12/10: Massive bilateral pulmonary emboli with RV dysfunction/shock, heparin  drip, s/p IR pulmonary artery thrombectomy 12/12: Levophed  weaned off 12/14: Transferred to [facility] 12/15: Heparin  drip transition to Eliquis  12/16: [employer] denial for CIR, appealed 12/18: Expedited appeal for [facility] denial upheld 12/21: Awaiting SNF placement  Assessment & Plan:   Acute hypoxic respiratory  failure Massive bilateral pulmonary embolism with cor pulmonale s/p IR thrombectomy Obstructive shock secondary to PE: Resolved CT angiogram chest 12/10 with large bilateral pulmonary emboli involving the bilateral pulmonary arteries with lobar branch extension bilaterally with right heart strain consistent with submassive PE, left lower lobe airspace disease consistent with pneumonia versus atelectasis, small left pleural effusion.  Patient was placed on a heparin  drip, IR was consulted and underwent pulmonary artery thrombectomy on 07/26/2024.  [place] duplex ultrasound bilateral lower extremities negative for DVT.  TTE 07/27/2024 with LVEF 65 to 70%, LV with normal function with no regional wall motion normalities, mild LVH, RV systolic function mildly reduced, no aortic stenosis, aortic dilation measuring 39 mm, IVC normal in size.  Heparin  drip now transition to Eliquis .  Vasopressors weaned off.  Oxygen well on room air. -- Eliquis   MSSA pneumonia Rhinovirus infection Streptococcus equinus bacteremia 1 out of 4 blood cultures positive for Streptococcus equinus.  Respiratory viral panel positive for rhinovirus/enterovirus.  Oxygen weaned off.  Repeat blood cultures 07/29/2024 with no growth x 5 days. Completed 10-day course of antibiotics with ceftriaxone .    Acute renal failure Etiology likely secondary to ATN given hypotension, IV contrast and diuretics.  Creatinine 3.82 on admission; now improved to baseline 0.74.  Diabetic ketoacidosis Type 1 diabetes mellitus Acute metabolic encephalopathy: Resolved Patient initially presenting to ED after being found unresponsive by family at home.  Upon EMS arrival, glucose elevated to 400.  On arrival to the ED pH 6.99, pCO2 15.3.  Glucose 1045.  Beta hydroxybutyrate acid greater than 8.00.  Urinalysis with 20 ketones.  CT head/C-spine negative for acute process.  Hemoglobin A1c 8.8% on 07/21/2024, poorly controlled.  Initially started on insulin  drip,  now transitioned to subcutaneous  insulin . Home regimen includes Tresiba 24 units daily, Humalog 5 units daily, Jardiance . -- Lantus  16 u [place] BID -- Novolog  6 u TIDAC  HTN -- Cardizem  120 mg p.o. daily (on 300 mg PO daily at home) -- Metoprolol  tartrate 25 g p.o. twice daily -- Hold home chlorthalidone   HLD --Hold home atorvastatin  for now  Hypophosphatemia Repleted.  Ascending aorta aneurysm TTE with mild dilation of the ascending aorta measuring 39 mm.  Continue outpatient surveillance.   DVT prophylaxis: Place and maintain sequential compression device Start: 07/26/24 1107 SCDs Start: 07/20/24 2255 apixaban  (ELIQUIS ) tablet 10 mg  apixaban  (ELIQUIS ) tablet 5 mg    Code Status: Full Code Family Communication:   Disposition Plan:  Level of care: Progressive Status is: Inpatient Remains inpatient appropriate because: Pending SNF placement    Consultants:  PCCM IR  Procedures:  Intubation 12/5 A-line 12/5 Central line 12/5 A-line 12/10 TTE  Antimicrobials:  Unasyn  12/4 - 12/6 Ceftriaxone  12/6 - 12/15   Subjective: Patient seen examined bedside, lying in bed.  Family present.  No complaints this morning.  Awaiting SNF placement.  Patient prefers Designer, television/film set, updated child psychotherapist.  No other questions or concerns at this time.  Denies headache, no dizziness, no chest pain, no palpitations, no shortness of breath, no abdominal pain, no fever/chills/night sweats, no nausea/vomiting/diarrhea, no focal weakness, no fatigue, no paresthesias.  No acute events overnight per nursing staff.  Objective: Vitals:   08/06/24 0014 08/06/24 0205 08/06/24 0513 08/06/24 1327  BP:  127/71 123/64 130/86  Pulse:  85 85 94  Resp: 20 16 12 16   Temp:  98.5 F (36.9 C) 98.3 F (36.8 C) 98.1 F (36.7 C)  TempSrc:  Oral Oral Oral  SpO2:  99% 99% 96%  Weight:   99.9 kg   Height:        Intake/Output Summary (Last 24 hours) at 08/06/2024 1341 Last data filed at 08/05/2024  1700 Gross per 24 hour  Intake 600 ml  Output --  Net 600 ml   Filed Weights   08/04/24 0413 08/05/24 0435 08/06/24 0513  Weight: 87.5 kg 98.1 kg 99.9 kg    Examination:  Physical Exam: GEN: NAD, alert and oriented x 3, wd/wn HEENT: NCAT, PERRL, EOMI, sclera clear, MMM PULM: CTAB w/o wheezes/crackles, normal respiratory effort, on room air CV: RRR w/o M/G/R GI: abd soft, NTND, + BS MSK: no peripheral edema, muscle strength globally intact 5/5 bilateral upper/lower extremities NEURO: No focal neurological deficit PSYCH: normal mood/affect Integumentary: No concerning rash/lesions/wounds noted on exposed skin surfaces    Data Reviewed: I have personally reviewed following labs and imaging studies  CBC: Recent Labs  Lab 07/31/24 0155 08/04/24 0545 08/06/24 0527  WBC 6.2 3.2* 3.5*  HGB 10.9* 11.2* 12.0*  HCT 32.6* 33.0* 35.4*  MCV 93.9 91.7 91.0  PLT 200 270 383   Basic Metabolic Panel: Recent Labs  Lab 07/31/24 0155 08/01/24 1012 08/02/24 0900 08/02/24 1557 08/03/24 0537 08/04/24 0545 08/06/24 0527  NA 142   < > 139 137 139 140 142  K 3.0*   < > 3.3* 4.0 3.4* 3.5 3.6  CL 104   < > 101 103 103 103 107  CO2 23   < > 19* 17* 21* 25 26  GLUCOSE 139*   < > 294* 194* 236* 230* 243*  BUN 7   < > 6 6 <5* <5* <5*  CREATININE 0.69   < > 0.76 0.73 0.70 0.77 0.74  CALCIUM  7.6*   < >  8.3* 7.9* 8.1* 8.2* 8.5*  PHOS 2.6  --   --   --   --   --   --    < > = values in this interval not displayed.   GFR: Estimated Creatinine Clearance: 119.7 mL/min (by C-G formula based on SCr of 0.74 mg/dL). Liver Function Tests: No results for input(s): AST, ALT, ALKPHOS, BILITOT, PROT, ALBUMIN in the last 168 hours. No results for input(s): LIPASE, AMYLASE in the last 168 hours. No results for input(s): AMMONIA in the last 168 hours. Coagulation Profile: No results for input(s): INR, PROTIME in the last 168 hours. Cardiac Enzymes: No results for input(s):  CKTOTAL, CKMB, CKMBINDEX, TROPONINI in the last 168 hours. BNP (last 3 results) Recent Labs    07/26/24 1902  PROBNP 605.0*   HbA1C: No results for input(s): HGBA1C in the last 72 hours. CBG: Recent Labs  Lab 08/05/24 1147 08/05/24 1702 08/05/24 1959 08/06/24 0752 08/06/24 1202  GLUCAP 230* 301* 271* 216* 182*   Lipid Profile: No results for input(s): CHOL, HDL, LDLCALC, TRIG, CHOLHDL, LDLDIRECT in the last 72 hours. Thyroid  Function Tests: No results for input(s): TSH, T4TOTAL, FREET4, T3FREE, THYROIDAB in the last 72 hours. Anemia Panel: No results for input(s): VITAMINB12, FOLATE, FERRITIN, TIBC, IRON, RETICCTPCT in the last 72 hours. Sepsis Labs: No results for input(s): PROCALCITON, LATICACIDVEN in the last 168 hours.  Recent Results (from the past 240 hours)  Culture, blood (Routine X 2) w Reflex to ID Panel     Status: None   Collection Time: 07/29/24 12:37 PM   Specimen: BLOOD LEFT ARM  Result Value Ref Range Status   Specimen Description   Final    BLOOD LEFT ARM Performed at [facility], [address] [place], [place] [address]    Special Requests   Final    BOTTLES DRAWN AEROBIC ONLY Blood Culture adequate volume Performed at [facility], [address] [place], [place] [address]    Culture   Final    NO GROWTH 5 DAYS Performed at [facility], [address] [place], [place] [address]    Report Status 08/03/2024 FINAL  Final  Culture, blood (Routine X 2) w Reflex to ID Panel     Status: None   Collection Time: 07/29/24 12:48 PM   Specimen: BLOOD LEFT ARM  Result Value Ref Range Status   Specimen Description   Final    BLOOD LEFT ARM Performed at [facility], [address], [place], [place] [address]    Special Requests   Final    BOTTLES DRAWN AEROBIC ONLY Blood Culture results may not be optimal due to an inadequate volume of blood received in culture  bottles Performed at [facility], [address] [place], [place] [address]    Culture   Final    NO GROWTH 5 DAYS Performed at [facility], [address] [place], [place] [address]    Report Status 08/03/2024 FINAL  Final         Radiology Studies: No results found.      Scheduled Meds:  apixaban   10 mg Oral BID   Followed by   NOREEN ON 08/07/2024] apixaban   5 mg Oral BID   Chlorhexidine  Gluconate Cloth  6 each Topical Daily   diltiazem   120 mg Oral Daily   docusate  100 mg Oral BID   feeding supplement (GLUCERNA SHAKE)  237 mL Oral BID BM  insulin  aspart  0-9 Units Subcutaneous TID WC   insulin  aspart  6 Units Subcutaneous TID WC   insulin  glargine  16 Units Subcutaneous BID   metoprolol  tartrate  25 mg Oral BID   pneumococcal 20-valent conjugate vaccine  0.5 mL Intramuscular Tomorrow-1000   polyethylene glycol  17 g Oral Daily   sodium chloride  flush  10-40 mL Intracatheter Q12H   Continuous Infusions:   LOS: 17 days    Time spent: 52 minutes spent on 08/06/2024 caring for this patient face-to-face including chart review, ordering labs/tests, documenting, discussion with nursing staff, consultants, updating family and interview/physical exam    Camellia PARAS Azrael Huss, DO [facility] Available via Epic secure chat 7am-7pm After these hours, please refer to coverage provider listed on [email or web address] 08/06/2024, 1:41 PM   "

## 2024-08-06 NOTE — Progress Notes (Signed)
" °   08/06/24 0014  BiPAP/CPAP/SIPAP  BiPAP/CPAP/SIPAP Pt Type Adult  BiPAP/CPAP/SIPAP Resmed  Reason BIPAP/CPAP not in use Other(comment) (pts family member stated that he was not gonna wear it tonight)  BiPAP/CPAP /SiPAP Vitals  Resp 20  MEWS Score/Color  MEWS Score 2  MEWS Score Color Yellow    "

## 2024-08-06 NOTE — TOC Progression Note (Addendum)
 Transition of Care [facility]) - Progression Note    Patient Details  Name: CROSBY ORIORDAN MRN: [id number] Date of Birth: 1966-02-02  [facility] CM/SW Contact  Lorraine LILLETTE Fenton, LCSW Phone Number: 08/06/2024, 2:51 PM  Clinical Narrative:    CSW spoke to pt regarding SNF choice- He chooses [facility] to request a bed.  CSW will request bed in [facility]  Pt needs Auth.   Expected Discharge Plan: Skilled Nursing Facility Barriers to Discharge: Continued Medical Work up               Expected Discharge Plan and Services   Discharge Planning Services: CM Consult Post Acute Care Choice: NA Living arrangements for the past 2 months: Single Family Home                                       Social Drivers of Health (SDOH) Interventions SDOH Screenings   Food Insecurity: No Food Insecurity (07/22/2024)  Housing: Low Risk (07/22/2024)  Transportation Needs: Patient Unable To Answer (07/22/2024)  Utilities: Patient Unable To Answer (07/22/2024)  Tobacco Use: Low Risk (07/20/2024)    Readmission Risk Interventions    07/24/2024    8:55 AM  Readmission Risk Prevention Plan  Post Dischage Appt Complete  Medication Screening Complete  Transportation Screening Complete

## 2024-08-07 DIAGNOSIS — E1111 Type 2 diabetes mellitus with ketoacidosis with coma: Secondary | ICD-10-CM | POA: Diagnosis not present

## 2024-08-07 DIAGNOSIS — I2609 Other pulmonary embolism with acute cor pulmonale: Secondary | ICD-10-CM | POA: Diagnosis not present

## 2024-08-07 LAB — GLUCOSE, CAPILLARY
Glucose-Capillary: 155 mg/dL — ABNORMAL HIGH (ref 70–99)
Glucose-Capillary: 188 mg/dL — ABNORMAL HIGH (ref 70–99)
Glucose-Capillary: 181 mg/dL — ABNORMAL HIGH (ref 70–99)
Glucose-Capillary: 146 mg/dL — ABNORMAL HIGH (ref 70–99)
Glucose-Capillary: 135 mg/dL — ABNORMAL HIGH (ref 70–99)

## 2024-08-07 MED ORDER — LACTATED RINGERS IV BOLUS
1000.0000 mL | Freq: Once | INTRAVENOUS | Status: AC
  Administered 2024-08-07: 1000 mL via INTRAVENOUS

## 2024-08-07 MED ORDER — LACTATED RINGERS IV SOLN: INTRAVENOUS | Status: DC

## 2024-08-07 NOTE — Progress Notes (Signed)
 " PROGRESS NOTE    Jorge Calhoun  FMW:[id number] DOB: 02/24/1966 DOA: 07/20/2024 PCP: Seabron Lenis, MD    Brief Narrative:   Jorge Calhoun is a 58 y.o. male with past medical history significant for HTN, PSVT, type 1 diabetes mellitus, nontoxic multinodular goiter, obesity who presented to [facility] ED on 07/20/2024 from home via EMS after being found unresponsive.  Apparently patient was feeling generally unwell over the previous 3-4 days with poor oral intake, nausea and vomiting; seen at the [facility] and was given something for an infection.  On EMS arrival, patient was noted to be hypotensive with SBP in the 80s, oxygen saturation 100% on 2 L nasal cannula and CBG 400.  Patient was initially admitted to the intensive care unit and placed on insulin  drip for DKA.  Patient's mental status continued to decline and he was subsequently intubated for airway protection on 07/21/2024.  Patient was placed on vasopressors and heparin  drip for obstructive shock secondary to pulmonary embolism.  Interventional radiology was consulted and patient underwent pulmonary artery thrombectomy on 07/26/2024.  Patient was transition to Eliquis  on 07/31/2024.  Seen by PT and OT with initial recommendation for [facility] inpatient rehabilitation, unfortunately denied by [facility].  Currently awaiting insurance authorization for SNF placement.  Significant hospital events: 12/4: Admit to ICU, DKA, RVP positive for rhinovirus/enterovirus 12/5: Progressive decline in mentation, intubated for airway protection, central line and A-line placed 12/9: Extubated, A-line removed 12/10: Massive bilateral pulmonary emboli with RV dysfunction/shock, heparin  drip, s/p IR pulmonary artery thrombectomy 12/12: Levophed  weaned off 12/14: Transferred to [facility] 12/15: Heparin  drip transition to Eliquis  12/16: [employer] denial for CIR, appealed 12/18: Expedited appeal for [facility] denial upheld 12/21: Awaiting SNF placement 12/22: + Orthostatic vital signs,  will receive IVF bolus/infusion, continue to await SNF placement, pending insurance authorization for [facility]  Assessment & Plan:   Acute hypoxic respiratory failure Massive bilateral pulmonary embolism with cor pulmonale s/p IR thrombectomy Obstructive shock secondary to PE: Resolved CT angiogram chest 12/10 with large bilateral pulmonary emboli involving the bilateral pulmonary arteries with lobar branch extension bilaterally with right heart strain consistent with submassive PE, left lower lobe airspace disease consistent with pneumonia versus atelectasis, small left pleural effusion.  Patient was placed on a heparin  drip, IR was consulted and underwent pulmonary artery thrombectomy on 07/26/2024.  [place] duplex ultrasound bilateral lower extremities negative for DVT.  TTE 07/27/2024 with LVEF 65 to 70%, LV with normal function with no regional wall motion normalities, mild LVH, RV systolic function mildly reduced, no aortic stenosis, aortic dilation measuring 39 mm, IVC normal in size.  Heparin  drip now transition to Eliquis .  Vasopressors weaned off.  Oxygen well on room air. -- Eliquis  5mg  PO BID  Orthostatic hypotension Patient overnight when arising to use the bathroom became dizzy.  Orthostatic vital signs positive this morning. -- LR 1L bolus followed by LR at 75 mL/h  MSSA pneumonia Rhinovirus infection Streptococcus equinus bacteremia 1 out of 4 blood cultures positive for Streptococcus equinus.  Respiratory viral panel positive for rhinovirus/enterovirus.  Oxygen weaned off.  Repeat blood cultures 07/29/2024 with no growth x 5 days. Completed 10-day course of antibiotics with ceftriaxone .    Acute renal failure Etiology likely secondary to ATN given hypotension, IV contrast and diuretics.  Creatinine 3.82 on admission; now improved to baseline 0.74.  Diabetic ketoacidosis Type 1 diabetes mellitus Acute metabolic encephalopathy: Resolved Patient initially  presenting to ED after being found unresponsive by family at home.  Upon EMS arrival, glucose elevated to 400.  On arrival to the ED pH 6.99, pCO2 15.3.  Glucose 1045.  Beta hydroxybutyrate acid greater than 8.00.  Urinalysis with 20 ketones.  CT head/C-spine negative for acute process.  Hemoglobin A1c 8.8% on 07/21/2024, poorly controlled.  Initially started on insulin  drip, now transitioned to subcutaneous insulin . Home regimen includes Tresiba 24 units daily, Humalog 5 units daily, Jardiance . -- Lantus  16 u [place] BID -- Novolog  6 u TIDAC  HTN -- Cardizem  120 mg p.o. daily (on 300 mg PO daily at home) -- Metoprolol  tartrate 25 g p.o. twice daily -- Hold home chlorthalidone   HLD -- Hold home atorvastatin  for now  Hypophosphatemia Repleted.  Ascending aorta aneurysm TTE with mild dilation of the ascending aorta measuring 39 mm.  Continue outpatient surveillance.   DVT prophylaxis: Place and maintain sequential compression device Start: 07/26/24 1107 SCDs Start: 07/20/24 2255 apixaban  (ELIQUIS ) tablet 5 mg    Code Status: Full Code Family Communication:   Disposition Plan:  Level of care: Progressive Status is: Inpatient Remains inpatient appropriate because: Pending authorization for SNF placement    Consultants:  PCCM IR  Procedures:  Intubation 12/5 A-line 12/5 Central line 12/5 A-line 12/10 TTE  Antimicrobials:  Unasyn  12/4 - 12/6 Ceftriaxone  12/6 - 12/15   Subjective: Patient seen examined bedside, lying in bed.  No family present.  RN present at bedside.  Overnight patient with presyncopal episode feeling dizzy when arising to use the bathroom.  Orthostatic vital signs positive this morning and will receive IV fluid hydration.  Continues with discussed increased oral intake.  Awaiting insurance authorization for SNF placement.  No other questions or concerns at this time.  Denies headache, no current dizziness, no chest pain, no palpitations, no shortness of breath,  no abdominal pain, no fever/chills/night sweats, no nausea/vomiting/diarrhea, no focal weakness, no fatigue, no paresthesias.  No other acute events overnight per nursing staff.  Objective: Vitals:   08/06/24 1945 08/07/24 0218 08/07/24 0429 08/07/24 0528  BP: 138/79 122/77  108/70  Pulse: 98 79  86  Resp: 16 20 10 20   Temp: 98.5 F (36.9 C) 98.4 F (36.9 C)  98.6 F (37 C)  TempSrc: Oral Oral  Oral  SpO2: 98% 98%  98%  Weight:   95.1 kg   Height:        Intake/Output Summary (Last 24 hours) at 08/07/2024 1131 Last data filed at 08/07/2024 0430 Gross per 24 hour  Intake 780 ml  Output --  Net 780 ml   Filed Weights   08/05/24 0435 08/06/24 0513 08/07/24 0429  Weight: 98.1 kg 99.9 kg 95.1 kg    Examination:  Physical Exam: GEN: NAD, alert and oriented x 3, wd/wn HEENT: NCAT, PERRL, EOMI, sclera clear, MMM PULM: CTAB w/o wheezes/crackles, normal respiratory effort, on room air CV: RRR w/o M/G/R GI: abd soft, NTND, + BS MSK: no peripheral edema, muscle strength globally intact 5/5 bilateral upper/lower extremities NEURO: No focal neurological deficit PSYCH: normal mood/affect Integumentary: No concerning rash/lesions/wounds noted on exposed skin surfaces    Data Reviewed: I have personally reviewed following labs and imaging studies  CBC: Recent Labs  Lab 08/04/24 0545 08/06/24 0527  WBC 3.2* 3.5*  HGB 11.2* 12.0*  HCT 33.0* 35.4*  MCV 91.7 91.0  PLT 270 383   Basic Metabolic Panel: Recent Labs  Lab 08/02/24 0900 08/02/24 1557 08/03/24 0537 08/04/24 0545 08/06/24 0527  NA 139 137 139 140 142  K 3.3* 4.0 3.4* 3.5  3.6  CL 101 103 103 103 107  CO2 19* 17* 21* 25 26  GLUCOSE 294* 194* 236* 230* 243*  BUN 6 6 <5* <5* <5*  CREATININE 0.76 0.73 0.70 0.77 0.74  CALCIUM  8.3* 7.9* 8.1* 8.2* 8.5*   GFR: Estimated Creatinine Clearance: 117 mL/min (by C-G formula based on SCr of 0.74 mg/dL). Liver Function Tests: No results for input(s): AST, ALT,  ALKPHOS, BILITOT, PROT, ALBUMIN in the last 168 hours. No results for input(s): LIPASE, AMYLASE in the last 168 hours. No results for input(s): AMMONIA in the last 168 hours. Coagulation Profile: No results for input(s): INR, PROTIME in the last 168 hours. Cardiac Enzymes: No results for input(s): CKTOTAL, CKMB, CKMBINDEX, TROPONINI in the last 168 hours. BNP (last 3 results) Recent Labs    07/26/24 1902  PROBNP 605.0*   HbA1C: No results for input(s): HGBA1C in the last 72 hours. CBG: Recent Labs  Lab 08/06/24 1946 08/06/24 2141 08/07/24 0217 08/07/24 0750 08/07/24 1113  GLUCAP 86 117* 155* 188* 181*   Lipid Profile: No results for input(s): CHOL, HDL, LDLCALC, TRIG, CHOLHDL, LDLDIRECT in the last 72 hours. Thyroid  Function Tests: No results for input(s): TSH, T4TOTAL, FREET4, T3FREE, THYROIDAB in the last 72 hours. Anemia Panel: No results for input(s): VITAMINB12, FOLATE, FERRITIN, TIBC, IRON, RETICCTPCT in the last 72 hours. Sepsis Labs: No results for input(s): PROCALCITON, LATICACIDVEN in the last 168 hours.  Recent Results (from the past 240 hours)  Culture, blood (Routine X 2) w Reflex to ID Panel     Status: None   Collection Time: 07/29/24 12:37 PM   Specimen: BLOOD LEFT ARM  Result Value Ref Range Status   Specimen Description   Final    BLOOD LEFT ARM Performed at [facility], [address], [place], [place] [address]    Special Requests   Final    BOTTLES DRAWN AEROBIC ONLY Blood Culture adequate volume Performed at [facility], [address], [place], [place] [address]    Culture   Final    NO GROWTH 5 DAYS Performed at [facility], [address], [place], [place] [address]    Report Status 08/03/2024 FINAL  Final  Culture, blood (Routine X 2) w Reflex to ID Panel     Status: None   Collection Time: 07/29/24 12:48 PM   Specimen: BLOOD LEFT ARM   Result Value Ref Range Status   Specimen Description   Final    BLOOD LEFT ARM Performed at [facility], [address], [place], [place] [address]    Special Requests   Final    BOTTLES DRAWN AEROBIC ONLY Blood Culture results may not be optimal due to an inadequate volume of blood received in culture bottles Performed at [facility], [address], [place], [place] [address]    Culture   Final    NO GROWTH 5 DAYS Performed at [facility], [address], [place], [place] [address]    Report Status 08/03/2024 FINAL  Final         Radiology Studies: No results found.      Scheduled Meds:  apixaban   5 mg Oral BID   Chlorhexidine  Gluconate Cloth  6 each Topical Daily   diltiazem   120 mg Oral Daily   docusate  100 mg Oral BID   feeding supplement (GLUCERNA SHAKE)  237 mL Oral BID BM   insulin  aspart  0-9 Units Subcutaneous TID  WC   insulin  aspart  6 Units Subcutaneous TID WC   insulin  glargine  16 Units Subcutaneous BID   metoprolol  tartrate  25 mg Oral BID   pneumococcal 20-valent conjugate vaccine  0.5 mL Intramuscular Tomorrow-1000   polyethylene glycol  17 g Oral Daily   sodium chloride  flush  10-40 mL Intracatheter Q12H   Continuous Infusions:  lactated ringers      Followed by   lactated ringers        LOS: 18 days    Time spent: 52 minutes spent on 08/07/2024 caring for this patient face-to-face including chart review, ordering labs/tests, documenting, discussion with nursing staff, consultants, updating family and interview/physical exam    Jorge PARAS Lannie Heaps, DO [facility] Available via Epic secure chat 7am-7pm After these hours, please refer to coverage provider listed on [email or web address] 08/07/2024, 11:31 AM   "

## 2024-08-07 NOTE — Plan of Care (Signed)
 Patient alert, experienced a near syncopal episode around 0200 while getting up to use the bathroom, patient became dizzy and diaphoretic. Patient denies any pain, VSS and CBG within normal limits. Will continue plan of care.  Problem: Safety: Goal: Ability to remain free from injury will improve Outcome: Progressing

## 2024-08-07 NOTE — TOC Progression Note (Addendum)
 Transition of Care [facility]) - Progression Note    Patient Details  Name: Jorge Calhoun MRN: [id number] Date of Birth: [birth year]  Transition of [facility]) CM/SW Contact  Bodie Abernethy, Nathanel, RN Phone Number: 08/07/2024, 11:46 AM  Clinical Narrative: Patient chose [facility]-rep Tiffany to initiate auth-await auth.  -12:31p Left voicemail w/Arpil Marsa transfer coordinator for service connection & CSW-patient's sister Nathanel called [facility]-she provided [place] [facility]W [facility] [phone]-left voicemail w/Nicole-await call back. -3:41p Faxed w/confirmation to [facility] all info requested-await outcome of approval.    Expected Discharge Plan: Skilled Nursing Facility Barriers to Discharge: Continued Medical Work up               Expected Discharge Plan and Services   Discharge Planning Services: CM Consult Post Acute Care Choice: NA Living arrangements for the past 2 months: Single Family Home                                       Social Drivers of Health (SDOH) Interventions SDOH Screenings   Food Insecurity: No Food Insecurity (07/22/2024)  Housing: Low Risk (07/22/2024)  Transportation Needs: Patient Unable To Answer (07/22/2024)  Utilities: Patient Unable To Answer (07/22/2024)  Tobacco Use: Low Risk (07/20/2024)    Readmission Risk Interventions    07/24/2024    8:55 AM  Readmission Risk Prevention Plan  Post Dischage Appt Complete  Medication Screening Complete  Transportation Screening Complete

## 2024-08-07 NOTE — Progress Notes (Signed)
" °   08/07/24 1220  Spiritual Encounters  Type of Visit Initial  Care provided to: Pt and family  Conversation partners present during encounter Nurse  Referral source Nurse (RN/NT/LPN)  Reason for visit Advance directives  OnCall Visit No  Interventions  Spiritual Care Interventions Made Established relationship of care and support;Compassionate presence;Reflective listening;Explored ethical dilemma;Other (comment) (Notarize new HCPOA)  Spiritual Care Plan  Recommendations for Clinical Staff Please be aware that a new HCPOA is in place and that communication should prioritize daughter and sister.   Per epic chat communication from [place], [place] on behalf of Mr Rossa, I provided spiritual care support to facilitate a new HCPOA naming his daughter, Rona, and sister, Nathanel, as primary decision makers. They are now listed in chart as emergency contacts. New HCPOA scanned to ACP and copy in shadow chart. Spouse, Arland, does not know of change; discretion requested. Patient did not articulate need to trespass spouse at this time but care team should be aware.  Mr Belk's sister, Nathanel, debriefed with me and processed her emotions with regard to care needs of her brother and impacts to his health.  I provided compassionate presence and assess safety concerns. Provided normalization of emotions and active and reflective listening. Checked-in with Lamar to verify his preferences, to affirm my role to advocate for him and facilitate the advanced directive discreetly. Shared relevnat information with care team and assisted to change contacts with patient's consent.  Connelly Spruell L. Delores HERO.Div "

## 2024-08-07 NOTE — Progress Notes (Signed)
 Physical Therapy Treatment Patient Details Name: Jorge Calhoun MRN: [id number] DOB: 1966/08/06 Today's Date: 08/07/2024   History of Present Illness Jorge Calhoun is a 58 y/o M who presented on 07/20/24 for DKA likely in the setting of rhinovirus URTI after being found down at home.  During hospitalization- 12/5 decline in respiratory and mentation requiring intubation, 12/9 extubated, 12/10 improving but then SOB when OOB and found to have massive bil PE requiring thrombectomy, 12/11-12/12 levophed  weaned . PMH significant for type I DM, HTN, hyperthyroidism    PT Comments  Orthostatic BP taken by nursing prior to PT. Refer to Doc flow sheets. Standing 3  BP  104/82. Post ambulation  104/83.  HR 116. No reports of feeling dizzy, diaphoretic.  Patient  will benefit from post acute rehab. Patient will benefit from continued inpatient follow up therapy, <3 hours/day.   If plan is discharge home, recommend the following: A little help with walking and/or transfers;A little help with bathing/dressing/bathroom;Assistance with cooking/housework;Assist for transportation;Help with stairs or ramp for entrance   Can travel by private vehicle     Yes  Equipment Recommendations  Rolling walker (2 wheels)    Recommendations for Other Services       Precautions / Restrictions Precautions Precautions: Fall Recall of Precautions/Restrictions: Impaired Precaution/Restrictions Comments: watch HR and BP, had diaphoresis in early AM today, no issues this visit Restrictions Weight Bearing Restrictions Per Provider Order: No     Mobility  Bed Mobility Overal bed mobility: Modified Independent                  Transfers Overall transfer level: Needs assistance Equipment used: Rolling walker (2 wheels) Transfers: Sit to/from Stand Sit to Stand: Contact guard assist           General transfer comment: CGA for safety;  slow to rise    Ambulation/Gait Ambulation/Gait assistance:  Contact guard assist Gait Distance (Feet):   100' x 3 with stand rest breaks Assistive device: Rolling walker (2 wheels)   Gait velocity: decreased     General Gait Details: Decreased speed and fatigued easily needing rest breaks.  Denies any dizziness.  Pt on RA   Stairs             Wheelchair Mobility     Tilt Bed    Modified Rankin (Stroke Patients Only)       Balance Overall balance assessment: Needs assistance Sitting-balance support: No upper extremity supported, Feet supported Sitting balance-Leahy Scale: Good     Standing balance support: Bilateral upper extremity supported, During functional activity, Reliant on assistive device for balance Standing balance-Leahy Scale: Poor                              Communication    Cognition Arousal: Alert Behavior During Therapy: WFL for tasks assessed/performed   PT - Cognitive impairments: No apparent impairments                       PT - Cognition Comments: slow processing   Following commands impaired: Follows one step commands with increased time    Cueing Cueing Techniques: Verbal cues  Exercises      General Comments        Pertinent Vitals/Pain Pain Assessment Pain Assessment: No/denies pain    Home Living  Prior Function            PT Goals (current goals can now be found in the care plan section) Progress towards PT goals: Progressing toward goals    Frequency    Min 3X/week      PT Plan      Co-evaluation              AM-PAC PT 6 Clicks Mobility   Outcome Measure  Help needed turning from your back to your side while in a flat bed without using bedrails?: None Help needed moving from lying on your back to sitting on the side of a flat bed without using bedrails?: None Help needed moving to and from a bed to a chair (including a wheelchair)?: A Little Help needed standing up from a chair using your arms  (e.g., wheelchair or bedside chair)?: A Little Help needed to walk in hospital room?: A Little Help needed climbing 3-5 steps with a railing? : A Lot 6 Click Score: 19    End of Session Equipment Utilized During Treatment: Gait belt Activity Tolerance: Patient tolerated treatment well Patient left: in chair;with call bell/phone within reach Nurse Communication: Mobility status PT Visit Diagnosis: Unsteadiness on feet (R26.81);Muscle weakness (generalized) (M62.81);Difficulty in walking, not elsewhere classified (R26.2)     Time: 9144-9082 PT Time Calculation (min) (ACUTE ONLY): 22 min  Charges:    $Gait Training: 8-22 mins PT General Charges $$ ACUTE PT VISIT: 1 Visit                     [name] PT Acute Rehabilitation Services Office [phone]   [name] 08/07/2024, 10:02 AM

## 2024-08-07 NOTE — Plan of Care (Signed)

## 2024-08-08 DIAGNOSIS — E1111 Type 2 diabetes mellitus with ketoacidosis with coma: Secondary | ICD-10-CM | POA: Diagnosis not present

## 2024-08-08 DIAGNOSIS — I2609 Other pulmonary embolism with acute cor pulmonale: Secondary | ICD-10-CM | POA: Diagnosis not present

## 2024-08-08 LAB — GLUCOSE, CAPILLARY
Glucose-Capillary: 106 mg/dL — ABNORMAL HIGH (ref 70–99)
Glucose-Capillary: 99 mg/dL (ref 70–99)
Glucose-Capillary: 152 mg/dL — ABNORMAL HIGH (ref 70–99)
Glucose-Capillary: 77 mg/dL (ref 70–99)

## 2024-08-08 NOTE — Progress Notes (Signed)
 " PROGRESS NOTE    Jorge Calhoun  FMW:[id number] DOB: [birth year] DOA: 07/20/2024 PCP: Seabron Lenis, MD    Brief Narrative:   Jorge Calhoun is a 58 y.o. male with past medical history significant for HTN, PSVT, type 1 diabetes mellitus, nontoxic multinodular goiter, obesity who presented to [facility] ED on 07/20/2024 from home via EMS after being found unresponsive.  Apparently patient was feeling generally unwell over the previous 3-4 days with poor oral intake, nausea and vomiting; seen at the [place] and was given something for an infection.  On EMS arrival, patient was noted to be hypotensive with SBP in the 80s, oxygen saturation 100% on 2 L nasal cannula and CBG 400.  Patient was initially admitted to the intensive care unit and placed on insulin  drip for DKA.  Patient's mental status continued to decline and he was subsequently intubated for airway protection on 07/21/2024.  Patient was placed on vasopressors and heparin  drip for obstructive shock secondary to pulmonary embolism.  Interventional radiology was consulted and patient underwent pulmonary artery thrombectomy on 07/26/2024.  Patient was transition to Eliquis  on 07/31/2024.  Seen by PT and OT with initial recommendation for [facility] inpatient rehabilitation, unfortunately denied by [facility].  Currently awaiting insurance authorization for SNF placement.  Significant hospital events: 12/4: Admit to ICU, DKA, RVP positive for rhinovirus/enterovirus 12/5: Progressive decline in mentation, intubated for airway protection, central line and A-line placed 12/9: Extubated, A-line removed 12/10: Massive bilateral pulmonary emboli with RV dysfunction/shock, heparin  drip, s/p IR pulmonary artery thrombectomy 12/12: Levophed  weaned off 12/14: Transferred to [facility] 12/15: Heparin  drip transition to Eliquis  12/16: [employer] denial for CIR, appealed 12/18: Expedited appeal for [facility] denial upheld 12/21: Awaiting SNF placement 12/22: + Orthostatic vital signs,  will receive IVF bolus/infusion, continue to await SNF placement, pending insurance authorization for [facility] 12/23: Pending insurance authorization for SNF  Assessment & Plan:   Acute hypoxic respiratory failure: Resolved Massive bilateral pulmonary embolism with cor pulmonale s/p IR thrombectomy Obstructive shock secondary to PE: Resolved CT angiogram chest 12/10 with large bilateral pulmonary emboli involving the bilateral pulmonary arteries with lobar branch extension bilaterally with right heart strain consistent with submassive PE, left lower lobe airspace disease consistent with pneumonia versus atelectasis, small left pleural effusion.  Patient was placed on a heparin  drip, IR was consulted and underwent pulmonary artery thrombectomy on 07/26/2024.  Vasser duplex ultrasound bilateral lower extremities negative for DVT.  TTE 07/27/2024 with LVEF 65 to 70%, LV with normal function with no regional wall motion normalities, mild LVH, RV systolic function mildly reduced, no aortic stenosis, aortic dilation measuring 39 mm, IVC normal in size.  Heparin  drip now transition to Eliquis .  Vasopressors weaned off.  Oxygen well on room air. -- Eliquis  5mg  PO BID  Orthostatic hypotension: Resolved Patient overnight when arising to use the bathroom became dizzy.  Orthostatic vital signs positive on 08/07/2024, received IV fluid bolus by continuous infusion with resolution of symptoms.   -- Discontinue IV fluids today, continue to encourage increased oral intake  MSSA pneumonia Rhinovirus infection Streptococcus equinus bacteremia 1 out of 4 blood cultures positive for Streptococcus equinus.  Respiratory viral panel positive for rhinovirus/enterovirus.  Oxygen weaned off.  Repeat blood cultures 07/29/2024 with no growth x 5 days. Completed 10-day course of antibiotics with ceftriaxone .    Acute renal failure Etiology likely secondary to ATN given hypotension, IV contrast and diuretics.   Creatinine 3.82 on admission; now improved to baseline 0.74.  Diabetic ketoacidosis  Type 1 diabetes mellitus Acute metabolic encephalopathy: Resolved Patient initially presenting to ED after being found unresponsive by family at home.  Upon EMS arrival, glucose elevated to 400.  On arrival to the ED pH 6.99, pCO2 15.3.  Glucose 1045.  Beta hydroxybutyrate acid greater than 8.00.  Urinalysis with 20 ketones.  CT head/C-spine negative for acute process.  Hemoglobin A1c 8.8% on 07/21/2024, poorly controlled.  Initially started on insulin  drip, now transitioned to subcutaneous insulin . Home regimen includes Tresiba 24 units daily, Humalog 5 units daily, Jardiance . -- Lantus  16 u [place] BID -- Novolog  6 u TIDAC  HTN -- Cardizem  120 mg p.o. daily (on 300 mg PO daily at home) -- Metoprolol  tartrate 25 g p.o. twice daily -- Hold home chlorthalidone   HLD -- Hold home atorvastatin  for now  Hypophosphatemia Repleted.  Ascending aorta aneurysm TTE with mild dilation of the ascending aorta measuring 39 mm.  Continue outpatient surveillance.   DVT prophylaxis: Place and maintain sequential compression device Start: 07/26/24 1107 SCDs Start: 07/20/24 2255 apixaban  (ELIQUIS ) tablet 5 mg    Code Status: Full Code Family Communication:   Disposition Plan:  Level of care: Progressive Status is: Inpatient Remains inpatient appropriate because: Pending authorization for SNF placement    Consultants:  PCCM IR  Procedures:  Intubation 12/5 A-line 12/5 Central line 12/5 A-line 12/10 TTE  Antimicrobials:  Unasyn  12/4 - 12/6 Ceftriaxone  12/6 - 12/15   Subjective: Patient seen examined bedside, sitting in bedside chair.  No complaints.  No further dizziness after receiving IV fluids yesterday.  Requesting to use the shower.  RN present at bedside.  Continue to await insurance authorization for SNF placement.  No other questions or concerns at this time.  Denies headache, no current dizziness, no  chest pain, no palpitations, no shortness of breath, no abdominal pain, no fever/chills/night sweats, no nausea/vomiting/diarrhea, no focal weakness, no fatigue, no paresthesias.  No other acute events overnight per nursing staff.  Objective: Vitals:   08/07/24 2136 08/07/24 2141 08/08/24 0349 08/08/24 0431  BP: 116/69   109/86  Pulse: 88   80  Resp: 18 17  17   Temp:    98.7 F (37.1 C)  TempSrc:      SpO2: 99%   100%  Weight:   96 kg   Height:        Intake/Output Summary (Last 24 hours) at 08/08/2024 1109 Last data filed at 08/08/2024 0350 Gross per 24 hour  Intake 782.51 ml  Output --  Net 782.51 ml   Filed Weights   08/06/24 0513 08/07/24 0429 08/08/24 0349  Weight: 99.9 kg 95.1 kg 96 kg    Examination:  Physical Exam: GEN: NAD, alert and oriented x 3, wd/wn HEENT: NCAT, PERRL, EOMI, sclera clear, MMM PULM: CTAB w/o wheezes/crackles, normal respiratory effort, on room air CV: RRR w/o M/G/R GI: abd soft, NTND, + BS MSK: no peripheral edema, muscle strength globally intact 5/5 bilateral upper/lower extremities NEURO: No focal neurological deficit PSYCH: normal mood/affect Integumentary: No concerning rash/lesions/wounds noted on exposed skin surfaces    Data Reviewed: I have personally reviewed following labs and imaging studies  CBC: Recent Labs  Lab 08/04/24 0545 08/06/24 0527  WBC 3.2* 3.5*  HGB 11.2* 12.0*  HCT 33.0* 35.4*  MCV 91.7 91.0  PLT 270 383   Basic Metabolic Panel: Recent Labs  Lab 08/02/24 0900 08/02/24 1557 08/03/24 0537 08/04/24 0545 08/06/24 0527  NA 139 137 139 140 142  K 3.3* 4.0 3.4* 3.5 3.6  CL 101 103 103 103 107  CO2 19* 17* 21* 25 26  GLUCOSE 294* 194* 236* 230* 243*  BUN 6 6 <5* <5* <5*  CREATININE 0.76 0.73 0.70 0.77 0.74  CALCIUM  8.3* 7.9* 8.1* 8.2* 8.5*   GFR: Estimated Creatinine Clearance: 117.6 mL/min (by C-G formula based on SCr of 0.74 mg/dL). Liver Function Tests: No results for input(s): AST, ALT,  ALKPHOS, BILITOT, PROT, ALBUMIN in the last 168 hours. No results for input(s): LIPASE, AMYLASE in the last 168 hours. No results for input(s): AMMONIA in the last 168 hours. Coagulation Profile: No results for input(s): INR, PROTIME in the last 168 hours. Cardiac Enzymes: No results for input(s): CKTOTAL, CKMB, CKMBINDEX, TROPONINI in the last 168 hours. BNP (last 3 results) Recent Labs    07/26/24 1902  PROBNP 605.0*   HbA1C: No results for input(s): HGBA1C in the last 72 hours. CBG: Recent Labs  Lab 08/07/24 0750 08/07/24 1113 08/07/24 1701 08/07/24 2017 08/08/24 0727  GLUCAP 188* 181* 146* 135* 106*   Lipid Profile: No results for input(s): CHOL, HDL, LDLCALC, TRIG, CHOLHDL, LDLDIRECT in the last 72 hours. Thyroid  Function Tests: No results for input(s): TSH, T4TOTAL, FREET4, T3FREE, THYROIDAB in the last 72 hours. Anemia Panel: No results for input(s): VITAMINB12, FOLATE, FERRITIN, TIBC, IRON, RETICCTPCT in the last 72 hours. Sepsis Labs: No results for input(s): PROCALCITON, LATICACIDVEN in the last 168 hours.  Recent Results (from the past 240 hours)  Culture, blood (Routine X 2) w Reflex to ID Panel     Status: None   Collection Time: 07/29/24 12:37 PM   Specimen: BLOOD LEFT ARM  Result Value Ref Range Status   Specimen Description   Final    BLOOD LEFT ARM Performed at [facility], [address], [place], [place] [address]    Special Requests   Final    BOTTLES DRAWN AEROBIC ONLY Blood Culture adequate volume Performed at [facility], [address] [place], [place] [address]    Culture   Final    NO GROWTH 5 DAYS Performed at [facility], [address] [place], [place] [address]    Report Status 08/03/2024 FINAL  Final  Culture, blood (Routine X 2) w Reflex to ID Panel     Status: None   Collection Time: 07/29/24 12:48 PM   Specimen: BLOOD LEFT ARM   Result Value Ref Range Status   Specimen Description   Final    BLOOD LEFT ARM Performed at [facility], [address] [place], [place] [address]    Special Requests   Final    BOTTLES DRAWN AEROBIC ONLY Blood Culture results may not be optimal due to an inadequate volume of blood received in culture bottles Performed at [facility], [address], [place], [place] [address]    Culture   Final    NO GROWTH 5 DAYS Performed at [facility], [address], [place], [place] [address]    Report Status 08/03/2024 FINAL  Final         Radiology Studies: No results found.      Scheduled Meds:  apixaban   5 mg Oral BID   Chlorhexidine  Gluconate Cloth  6 each Topical Daily   diltiazem   120 mg Oral Daily   docusate  100 mg Oral BID   feeding supplement (GLUCERNA SHAKE)  237 mL Oral BID BM   insulin  aspart  0-9 Units Subcutaneous TID WC  insulin  aspart  6 Units Subcutaneous TID WC   insulin  glargine  16 Units Subcutaneous BID   metoprolol  tartrate  25 mg Oral BID   pneumococcal 20-valent conjugate vaccine  0.5 mL Intramuscular Tomorrow-1000   polyethylene glycol  17 g Oral Daily   sodium chloride  flush  10-40 mL Intracatheter Q12H   Continuous Infusions:  lactated ringers  75 mL/hr at 08/08/24 0300     LOS: 19 days    Time spent: 45 minutes spent on 08/08/2024 caring for this patient face-to-face including chart review, ordering labs/tests, documenting, discussion with nursing staff, consultants, updating family and interview/physical exam    Camellia PARAS Kennen Stammer, DO [facility] Available via Epic secure chat 7am-7pm After these hours, please refer to coverage provider listed on [email or web address] 08/08/2024, 11:09 AM   "

## 2024-08-08 NOTE — Progress Notes (Signed)
" °   08/08/24 2120  BiPAP/CPAP/SIPAP  BiPAP/CPAP/SIPAP Pt Type Adult  Reason BIPAP/CPAP not in use Non-compliant (Pt refuses CPAP for the night. RT advised to call if he changes his mind.)    "

## 2024-08-08 NOTE — Progress Notes (Signed)
 Occupational Therapy Treatment Patient Details Name: Jorge Calhoun MRN: [id number] DOB: [birth year] Today's Date: 08/08/2024   History of present illness Jorge Calhoun is a 58 y/o M who presented on 07/20/24 for DKA likely in the setting of rhinovirus URTI after being found down at home.  During hospitalization- 12/5 decline in respiratory and mentation requiring intubation, 12/9 extubated, 12/10 improving but then SOB when OOB and found to have massive bil PE requiring thrombectomy, 12/11-12/12 levophed  weaned . PMH significant for type I DM, HTN, hyperthyroidism   OT comments  Patient seen for skilled OT session this afternoon. Progressing well with overall activity tolerance and balance during BADL's and level surface mobility. Sister in room for carryover of education. See below for current status and treatment provided. Patient requires continued Acute care hospital level OT services to progress safety and functional performance and allow for discharge. Patient will benefit from continued inpatient follow up therapy, <3 hours/day.         If plan is discharge home, recommend the following:  A little help with walking and/or transfers;A little help with bathing/dressing/bathroom;Assistance with cooking/housework;Assist for transportation;Help with stairs or ramp for entrance;Supervision due to cognitive status   Equipment Recommendations  BSC/3in1;Tub/shower bench       Precautions / Restrictions Precautions Precautions: Fall Recall of Precautions/Restrictions: Impaired Precaution/Restrictions Comments: watch HR and BP Restrictions Weight Bearing Restrictions Per Provider Order: No       Mobility Bed Mobility Overal bed mobility:  (was in recliner and remained post session)                  Transfers Overall transfer level: Needs assistance Equipment used: Rolling walker (2 wheels) Transfers: Sit to/from Stand, Bed to chair/wheelchair/BSC Sit to Stand: Contact  guard assist, Min assist     Step pivot transfers: Contact guard assist, Min assist     General transfer comment: min cues for pacing and AD integration     Balance Overall balance assessment: Needs assistance Sitting-balance support: No upper extremity supported, Feet supported Sitting balance-Leahy Scale: Good     Standing balance support: Bilateral upper extremity supported, During functional activity Standing balance-Leahy Scale: Poor Standing balance comment: used RW and trial without briefly with min A support                           ADL either performed or assessed with clinical judgement   ADL Overall ADL's : Needs assistance/impaired                     Lower Body Dressing: Minimal assistance Lower Body Dressing Details (indicate cue type and reason): reinforced sock aide use with teach back Toilet Transfer: Contact guard assist;Minimal assistance;Regular Toilet           Functional mobility during ADLs: Contact guard assist;Minimal assistance;Rolling walker (2 wheels) General ADL Comments: reinforced RW use for safety, trial without for 25 ft functional amb with CGA , cues for ECT/5 P's    Extremity/Trunk Assessment Upper Extremity Assessment Upper Extremity Assessment: Generalized weakness;Right hand dominant   Lower Extremity Assessment Lower Extremity Assessment: Defer to PT evaluation        Vision   Vision Assessment?: Wears glasses for reading;Wears glasses for driving         Communication Communication Communication: No apparent difficulties   Cognition Arousal: Alert Behavior During Therapy: WFL for tasks assessed/performed Cognition: Cognition impaired  OT - Cognition Comments: mildly slower processing and insight but significantly improved since initial eval                 Following commands: Intact Following commands impaired: Follows one step commands with increased time      Cueing    Cueing Techniques: Verbal cues        General Comments continues to fear not able to tolerate steps at home thus snf remains appropriate with HR this session 90-98 bpm    Pertinent Vitals/ Pain       Pain Assessment Pain Assessment: No/denies pain   Frequency  Min 2X/week        Progress Toward Goals  OT Goals(current goals can now be found in the care plan section)  Progress towards OT goals: Progressing toward goals            AM-PAC OT 6 Clicks Daily Activity     Outcome Measure   Help from another person eating meals?: None Help from another person taking care of personal grooming?: A Little Help from another person toileting, which includes using toliet, bedpan, or urinal?: A Little Help from another person bathing (including washing, rinsing, drying)?: A Little Help from another person to put on and taking off regular upper body clothing?: A Little Help from another person to put on and taking off regular lower body clothing?: A Little 6 Click Score: 19    End of Session Equipment Utilized During Treatment: Gait belt;Rolling walker (2 wheels)  OT Visit Diagnosis: Unsteadiness on feet (R26.81);Muscle weakness (generalized) (M62.81);Cognitive communication deficit (R41.841)   Activity Tolerance Patient tolerated treatment well   Patient Left with chair alarm set;with family/visitor present;with call bell/phone within reach   Nurse Communication Mobility status        Time: 8493-8469 OT Time Calculation (min): 24 min  Charges: OT General Charges $OT Visit: 1 Visit OT Treatments $Therapeutic Activity: 8-22 mins Yohann Curl OT/L Acute Rehabilitation Department  [phone]  08/08/2024, 4:58 PM

## 2024-08-08 NOTE — TOC Progression Note (Addendum)
 Transition of Care [facility]) - Progression Note    Patient Details  Name: Jorge Calhoun MRN: [id number] Date of Birth: 1966-04-11  Transition of [facility]) CM/SW Contact  Romney Compean, Nathanel, RN Phone Number: 08/08/2024, 10:22 AM  Clinical Narrative:     [facility] CONTRACT NURSING FACILITIES: Updated September 08, 2022   [place]:  [facility]  [address] [place], [place] [address] [phone] FAX: [phone]  [facility]:  [facility] [address] of [employer])   [address] [place], [place] [address] [phone] FAX: [phone]                                                             [facility]  [address] [place], [place] [address] [phone] FAX: [phone]   [place]:    [facility]  [address] [place], [place] [address] [phone] FAX: [phone]  [place]:   [facility] ** [address] [place], [place]  [address] [phone] FAX:  [phone]  [facility]:   [facility]. [address]      [address]      [place], [place]  [address]      [phone] FAX:  [phone]  [place]: [facility]       [address]      [place], [place]  [address]      [phone] FAX:  [phone]  [facility]:  [place]  [address]    [place]  [address] [phone]         FAX: [phone]     Spoke to Patient's sister)Gram Siedlecki) aobut dl/c plans-patient/Rue Tinnel in agreement to 1st facility that can accept-[facility] rep Tiffany checking on pending auth. Contacted Nicole(CS[facility] with [facility]) [phone] vm w/Alicia Driver-not in per message called & left voicemail on ext [phone]- she is aware all info sent to [facility], & awaiting process for approval, & ST  SNF choices.  [facility] LLOYD ALCIDE [phone] 2  [facility] [address] [place] [address] Lloyd MALONE Region 1 Skilled Nursing Facility [place] [phone]  [facility] [address] [place] [address] Lloyd MALONE Region 1 Skilled Nursing Facility [facility] AND REH[facility] [phone]  [facility] AND REHABILITATION [address] [place] [address] [facility] Sk[facility] Nursing Facility [place] [phone]  [facility] LIVING & REHAB AT [place] CON [address] [place] [address] Lloyd MALONE Region 1 Skilled Nursing Facility JAME ALCIDE [phone] Cited for Abuse [facility] AND REHABILITATION CE [address] [place] [address] [place]  [facility]illed Nursing Facility [facility] [phone] Cited for Abuse [facility] [address] [place] [address] [place]  [facility] Skilled Nursing Facility [place] KARL GIVEN [phone] Cited for Abuse [facility] AND R [address] [place] [address] [place]  -Provided [place] listing to patient/Jalayia Bagheri(sister)  for choice, now they prefer [place] area.   -3:40p-Spoke to patient/Phuong Moffatt(sister) they would prefer [place] area for ST SNF-since patient lives, & has more family support in [place] area. [facility] rep Rebecca(Rebecca tel#[phone] is aware & provided me with Isabel Mechanic tel# [phone] informing of [facility] [place] facilities-await list for patient/family to chosse, once facility chosen(send via hub), & bed offer given, then Isabel Mechanic will get auth for the chosen facility.  -received [facility] listing-provided  it to [employer]) await choice-informed her to let the nurse covering be aware so they can contact ICM following. Contact Nat CSW at [facility] once choice made tel#[phone].  Expected Discharge Plan: Skilled Nursing Facility Barriers to Discharge: Insurance Authorization               Expected Discharge Plan and Services   Discharge Planning Services: CM Consult Post Acute Care Choice: Skilled Nursing Facility Living arrangements for the past 2 months: Skilled Nursing Facility                                       Social Drivers of Health (SDOH) Interventions SDOH Screenings   Food Insecurity: No Food Insecurity (07/22/2024)  Housing: Low Risk (07/22/2024)  Transportation Needs: Patient Unable To Answer (07/22/2024)  Utilities: Patient Unable To Answer (07/22/2024)  Tobacco Use: Low Risk (07/20/2024)    Readmission Risk Interventions    07/24/2024    8:55 AM  Readmission Risk Prevention Plan  Post Dischage Appt Complete  Medication Screening Complete  Transportation Screening Complete

## 2024-08-08 NOTE — TOC Progression Note (Signed)
 Transition of Care [facility]) - Progression Note    Patient Details  Name: TYRAE ALCOSER MRN: [id number] Date of Birth: 1965-11-17  [facility]) CM/SW Contact  Heather DELENA Saltness, LCSW Phone Number: 08/08/2024, 5:09 PM  Clinical Narrative:    CSW received phone call from [place], [place] assigned to pt, stating pt's family has chosen facility for SNF rehab. CSW sent FL2 and referral packet to [facility] via fax at [phone]. TOC will continue to follow.   Expected Discharge Plan: Skilled Nursing Facility Barriers to Discharge: Insurance Authorization   Expected Discharge Plan and Services   Discharge Planning Services: CM Consult Post Acute Care Choice: Skilled Nursing Facility Living arrangements for the past 2 months: Skilled Nursing Facility                  Social Drivers of Health (SDOH) Interventions SDOH Screenings   Food Insecurity: No Food Insecurity (07/22/2024)  Housing: Low Risk (07/22/2024)  Transportation Needs: Patient Unable To Answer (07/22/2024)  Utilities: Patient Unable To Answer (07/22/2024)  Tobacco Use: Low Risk (07/20/2024)    Readmission Risk Interventions    07/24/2024    8:55 AM  Readmission Risk Prevention Plan  Post Dischage Appt Complete  Medication Screening Complete  Transportation Screening Complete   Signed: Heather Saltness, MSW, LCSW Clinical Social Worker Inpatient Care Management 08/08/2024 5:11 PM

## 2024-08-08 NOTE — Plan of Care (Signed)

## 2024-08-09 DIAGNOSIS — E1111 Type 2 diabetes mellitus with ketoacidosis with coma: Secondary | ICD-10-CM | POA: Diagnosis not present

## 2024-08-09 DIAGNOSIS — I2609 Other pulmonary embolism with acute cor pulmonale: Secondary | ICD-10-CM | POA: Diagnosis not present

## 2024-08-09 LAB — GLUCOSE, CAPILLARY
Glucose-Capillary: 142 mg/dL — ABNORMAL HIGH (ref 70–99)
Glucose-Capillary: 145 mg/dL — ABNORMAL HIGH (ref 70–99)
Glucose-Capillary: 147 mg/dL — ABNORMAL HIGH (ref 70–99)
Glucose-Capillary: 173 mg/dL — ABNORMAL HIGH (ref 70–99)
Glucose-Capillary: 182 mg/dL — ABNORMAL HIGH (ref 70–99)
Glucose-Capillary: 60 mg/dL — ABNORMAL LOW (ref 70–99)
Glucose-Capillary: 77 mg/dL (ref 70–99)

## 2024-08-09 NOTE — Plan of Care (Signed)

## 2024-08-09 NOTE — Progress Notes (Signed)
 " PROGRESS NOTE    Jorge Calhoun  FMW:[id number] DOB: [birth year] DOA: 07/20/2024 PCP: Jorge Lenis, MD    Brief Narrative:   Jorge Calhoun is a 58 y.o. male with past medical history significant for HTN, PSVT, type 1 diabetes mellitus, nontoxic multinodular goiter, obesity who presented to [facility] ED on 07/20/2024 from home via EMS after being found unresponsive.  Apparently patient was feeling generally unwell over the previous 3-4 days with poor oral intake, nausea and vomiting; seen at the [facility] and was given something for an infection.  On EMS arrival, patient was noted to be hypotensive with SBP in the 80s, oxygen saturation 100% on 2 L nasal cannula and CBG 400.  Patient was initially admitted to the intensive care unit and placed on insulin  drip for DKA.  Patient's mental status continued to decline and he was subsequently intubated for airway protection on 07/21/2024.  Patient was placed on vasopressors and heparin  drip for obstructive shock secondary to pulmonary embolism.  Interventional radiology was consulted and patient underwent pulmonary artery thrombectomy on 07/26/2024.  Patient was transition to Eliquis  on 07/31/2024.  Seen by PT and OT with initial recommendation for [facility] inpatient rehabilitation, unfortunately denied by [facility].  Currently awaiting insurance authorization for SNF placement.  Significant hospital events: 12/4: Admit to ICU, DKA, RVP positive for rhinovirus/enterovirus 12/5: Progressive decline in mentation, intubated for airway protection, central line and A-line placed 12/9: Extubated, A-line removed 12/10: Massive bilateral pulmonary emboli with RV dysfunction/shock, heparin  drip, s/p IR pulmonary artery thrombectomy 12/12: Levophed  weaned off 12/14: Transferred to [facility] 12/15: Heparin  drip transition to Eliquis  12/16: UHC denial for CIR, appealed 12/18: Expedited appeal for [facility] denial upheld 12/21: Awaiting SNF placement 12/22: + Orthostatic vital signs,  will receive IVF bolus/infusion, continue to await SNF placement, pending insurance authorization for [place] [facility] 12/23: Pending insurance authorization for SNF  Assessment & Plan:   Acute hypoxic respiratory failure: Resolved Massive bilateral pulmonary embolism with cor pulmonale s/p IR thrombectomy Obstructive shock secondary to PE: Resolved CT angiogram chest 12/10 with large bilateral pulmonary emboli involving the bilateral pulmonary arteries with lobar branch extension bilaterally with right heart strain consistent with submassive PE, left lower lobe airspace disease consistent with pneumonia versus atelectasis, small left pleural effusion.  Patient was placed on a heparin  drip, IR was consulted and underwent pulmonary artery thrombectomy on 07/26/2024.  Vasser duplex ultrasound bilateral lower extremities negative for DVT.  TTE 07/27/2024 with LVEF 65 to 70%, LV with normal function with no regional wall motion normalities, mild LVH, RV systolic function mildly reduced, no aortic stenosis, aortic dilation measuring 39 mm, IVC normal in size.  Heparin  drip now transition to Eliquis .  Vasopressors weaned off.  Oxygen well on room air. -- Eliquis  5mg  PO BID  Orthostatic hypotension: Resolved Patient overnight when arising to use the bathroom became dizzy.  Orthostatic vital signs positive on 08/07/2024, received IV fluid bolus by continuous infusion with resolution of symptoms.   -- continue to encourage increased oral intake  MSSA pneumonia Rhinovirus infection Streptococcus equinus bacteremia 1 out of 4 blood cultures positive for Streptococcus equinus.  Respiratory viral panel positive for rhinovirus/enterovirus.  Oxygen weaned off.  Repeat blood cultures 07/29/2024 with no growth x 5 days. Completed 10-day course of antibiotics with ceftriaxone .    Acute renal failure Etiology likely secondary to ATN given hypotension, IV contrast and diuretics.  Creatinine 3.82 on  admission; now improved to baseline 0.74.  Diabetic ketoacidosis Type 1 diabetes mellitus  Acute metabolic encephalopathy: Resolved Patient initially presenting to ED after being found unresponsive by family at home.  Upon EMS arrival, glucose elevated to 400.  On arrival to the ED pH 6.99, pCO2 15.3.  Glucose 1045.  Beta hydroxybutyrate acid greater than 8.00.  Urinalysis with 20 ketones.  CT head/C-spine negative for acute process.  Hemoglobin A1c 8.8% on 07/21/2024, poorly controlled.  Initially started on insulin  drip, now transitioned to subcutaneous insulin . Home regimen includes Tresiba 24 units daily, Humalog 5 units daily, Jardiance . -- Lantus  16 u [place] BID -- Novolog  6 u TIDAC  HTN -- Cardizem  120 mg p.o. daily (on 300 mg PO daily at home) -- Metoprolol  tartrate 25 g p.o. twice daily -- Hold home chlorthalidone   HLD -- Hold home atorvastatin  for now  Hypophosphatemia Repleted.  Ascending aorta aneurysm TTE with mild dilation of the ascending aorta measuring 39 mm.  Continue outpatient surveillance.   DVT prophylaxis: Place and maintain sequential compression device Start: 07/26/24 1107 SCDs Start: 07/20/24 2255 apixaban  (ELIQUIS ) tablet 5 mg    Code Status: Full Code Family Communication:   Disposition Plan:  Level of care: Progressive Status is: Inpatient Remains inpatient appropriate because: Pending authorization for SNF placement    Consultants:  PCCM IR  Procedures:  Intubation 12/5 A-line 12/5 Central line 12/5 A-line 12/10 TTE  Antimicrobials:  Unasyn  12/4 - 12/6 Ceftriaxone  12/6 - 12/15   Subjective: Patient seen examined bedside, walking around room.  No complaints.  Continue to await insurance authorization for SNF placement.  No other questions or concerns at this time.  Denies headache, no current dizziness, no chest pain, no palpitations, no shortness of breath, no abdominal pain, no fever/chills/night sweats, no nausea/vomiting/diarrhea, no  focal weakness, no fatigue, no paresthesias.  No other acute events overnight per nursing staff.  Objective: Vitals:   08/08/24 1417 08/08/24 2022 08/09/24 0500 08/09/24 0553  BP: 110/70 119/87  114/67  Pulse: 99 (!) 112  82  Resp: 16 16  20   Temp: 98 F (36.7 C) 98.7 F (37.1 C)  98.7 F (37.1 C)  TempSrc:    Oral  SpO2: 100% 98%  96%  Weight:   96.6 kg   Height:        Intake/Output Summary (Last 24 hours) at 08/09/2024 1255 Last data filed at 08/09/2024 1030 Gross per 24 hour  Intake 120 ml  Output --  Net 120 ml   Filed Weights   08/07/24 0429 08/08/24 0349 08/09/24 0500  Weight: 95.1 kg 96 kg 96.6 kg    Examination:  Physical Exam: GEN: NAD, alert and oriented x 3, wd/wn HEENT: NCAT, PERRL, EOMI, sclera clear, MMM PULM: CTAB w/o wheezes/crackles, normal respiratory effort, on room air CV: RRR w/o M/G/R GI: abd soft, NTND, + BS MSK: no peripheral edema, muscle strength globally intact 5/5 bilateral upper/lower extremities NEURO: No focal neurological deficit PSYCH: normal mood/affect Integumentary: No concerning rash/lesions/wounds noted on exposed skin surfaces    Data Reviewed: I have personally reviewed following labs and imaging studies  CBC: Recent Labs  Lab 08/04/24 0545 08/06/24 0527  WBC 3.2* 3.5*  HGB 11.2* 12.0*  HCT 33.0* 35.4*  MCV 91.7 91.0  PLT 270 383   Basic Metabolic Panel: Recent Labs  Lab 08/02/24 1557 08/03/24 0537 08/04/24 0545 08/06/24 0527  NA 137 139 140 142  K 4.0 3.4* 3.5 3.6  CL 103 103 103 107  CO2 17* 21* 25 26  GLUCOSE 194* 236* 230* 243*  BUN 6 <5* <  5* <5*  CREATININE 0.73 0.70 0.77 0.74  CALCIUM  7.9* 8.1* 8.2* 8.5*   GFR: Estimated Creatinine Clearance: 117.8 mL/min (by C-G formula based on SCr of 0.74 mg/dL). Liver Function Tests: No results for input(s): AST, ALT, ALKPHOS, BILITOT, PROT, ALBUMIN in the last 168 hours. No results for input(s): LIPASE, AMYLASE in the last 168 hours. No  results for input(s): AMMONIA in the last 168 hours. Coagulation Profile: No results for input(s): INR, PROTIME in the last 168 hours. Cardiac Enzymes: No results for input(s): CKTOTAL, CKMB, CKMBINDEX, TROPONINI in the last 168 hours. BNP (last 3 results) Recent Labs    07/26/24 1902  PROBNP 605.0*   HbA1C: No results for input(s): HGBA1C in the last 72 hours. CBG: Recent Labs  Lab 08/08/24 1637 08/08/24 2137 08/09/24 0023 08/09/24 0724 08/09/24 1124  GLUCAP 152* 77 145* 147* 173*   Lipid Profile: No results for input(s): CHOL, HDL, LDLCALC, TRIG, CHOLHDL, LDLDIRECT in the last 72 hours. Thyroid  Function Tests: No results for input(s): TSH, T4TOTAL, FREET4, T3FREE, THYROIDAB in the last 72 hours. Anemia Panel: No results for input(s): VITAMINB12, FOLATE, FERRITIN, TIBC, IRON, RETICCTPCT in the last 72 hours. Sepsis Labs: No results for input(s): PROCALCITON, LATICACIDVEN in the last 168 hours.  No results found for this or any previous visit (from the past 240 hours).        Radiology Studies: No results found.      Scheduled Meds:  apixaban   5 mg Oral BID   Chlorhexidine  Gluconate Cloth  6 each Topical Daily   diltiazem   120 mg Oral Daily   docusate  100 mg Oral BID   feeding supplement (GLUCERNA SHAKE)  237 mL Oral BID BM   insulin  aspart  0-9 Units Subcutaneous TID WC   insulin  aspart  6 Units Subcutaneous TID WC   insulin  glargine  16 Units Subcutaneous BID   metoprolol  tartrate  25 mg Oral BID   pneumococcal 20-valent conjugate vaccine  0.5 mL Intramuscular Tomorrow-1000   polyethylene glycol  17 g Oral Daily   sodium chloride  flush  10-40 mL Intracatheter Q12H   Continuous Infusions:     LOS: 20 days    Time spent: 45 minutes spent on 08/09/2024 caring for this patient face-to-face including chart review, ordering labs/tests, documenting, discussion with nursing staff, consultants, updating  family and interview/physical exam    Camellia PARAS Ozie Dimaria, DO [facility] Available via Epic secure chat 7am-7pm After these hours, please refer to coverage provider listed on [email or web address] 08/09/2024, 12:55 PM   "

## 2024-08-09 NOTE — Plan of Care (Signed)

## 2024-08-09 NOTE — TOC Progression Note (Addendum)
 Transition of Care [facility]) - Progression Note    Patient Details  Name: Jorge Calhoun MRN: [id number] Date of Birth: 12-17-1965  [facility] CM/SW Contact  Jon ONEIDA Anon, RN Phone Number: 08/09/2024, 2:52 PM  Clinical Narrative:    RNCM received phone call from Admissions Coordinator from [facility] [phone] at 1439 stating that pt has been accepted and can offer a bed. RNCM spoke with Nat, SW [facility]  to inform her of [facility] offer made and she states that she is sending information over to the team, and [facility] will need to provide oversight for the pt due to the facility being in their area. Pt family made aware of pt being accepted and the need to wait on the [place] to finalize the needs on their part. Nat, SW with [facility] states that she will be out of the office after today until 12/29. If no return call today before she leaves someone from her office will follow-up on Friday 12/26. ICM following.  Addendum: 23: RNCM received phone call from [place], SW stating she was unable to reach anyone at the [facility] office. States someone will be back in office on Monday 12/29. RNCM called to speak with [facility] at facility and receptionist states she has gone home for the day and to follow-up on Friday morning.    Expected Discharge Plan: Skilled Nursing Facility Barriers to Discharge: Insurance Authorization               Expected Discharge Plan and Services   Discharge Planning Services: CM Consult Post Acute Care Choice: Skilled Nursing Facility Living arrangements for the past 2 months: Skilled Nursing Facility                                       Social Drivers of Health (SDOH) Interventions SDOH Screenings   Food Insecurity: No Food Insecurity (07/22/2024)  Housing: Low Risk (07/22/2024)  Transportation Needs: Patient Unable To Answer (07/22/2024)  Utilities: Patient Unable To Answer (07/22/2024)  Tobacco  Use: Low Risk (07/20/2024)    Readmission Risk Interventions    07/24/2024    8:55 AM  Readmission Risk Prevention Plan  Post Dischage Appt Complete  Medication Screening Complete  Transportation Screening Complete

## 2024-08-10 DIAGNOSIS — E1111 Type 2 diabetes mellitus with ketoacidosis with coma: Secondary | ICD-10-CM | POA: Diagnosis not present

## 2024-08-10 LAB — GLUCOSE, CAPILLARY
Glucose-Capillary: 92 mg/dL (ref 70–99)
Glucose-Capillary: 179 mg/dL — ABNORMAL HIGH (ref 70–99)
Glucose-Capillary: 157 mg/dL — ABNORMAL HIGH (ref 70–99)
Glucose-Capillary: 111 mg/dL — ABNORMAL HIGH (ref 70–99)

## 2024-08-10 NOTE — Progress Notes (Signed)
" °   08/10/24 2228  BiPAP/CPAP/SIPAP  BiPAP/CPAP/SIPAP Pt Type Adult  BiPAP/CPAP/SIPAP Resmed  Mask Type Nasal pillows  Dentures removed? Not applicable  FiO2 (%) 21 %  Patient Home Machine Yes  Safety Check Completed by RT for Home Unit Yes, no issues noted  Patient Home Mask Yes  Patient Home Tubing Yes  Device Plugged into RED Power Outlet Yes    "

## 2024-08-10 NOTE — Progress Notes (Signed)
 Around 0300 pt wife came up to floor, d/t ED not calling and letting RN know about visitation. Pt stated he does not want his wife to visit him at the hospital, Wife was informed of pts wishes.Consulting Civil Engineer notified. Pt transferred to different room for safety. Will continue with plan of care.

## 2024-08-10 NOTE — Progress Notes (Signed)
 " PROGRESS NOTE    Jorge Calhoun  FMW:[id number] DOB: July 06, 1966 DOA: 07/20/2024 PCP: Seabron Lenis, MD    Brief Narrative:   Jorge Calhoun is a 58 y.o. male with past medical history significant for HTN, PSVT, type 1 diabetes mellitus, nontoxic multinodular goiter, obesity who presented to [facility] ED on 07/20/2024 from home via EMS after being found unresponsive.  Apparently patient was feeling generally unwell over the previous 3-4 days with poor oral intake, nausea and vomiting; seen at the [facility] and was given something for an infection.  On EMS arrival, patient was noted to be hypotensive with SBP in the 80s, oxygen saturation 100% on 2 L nasal cannula and CBG 400.  Patient was initially admitted to the intensive care unit and placed on insulin  drip for DKA.  Patient's mental status continued to decline and he was subsequently intubated for airway protection on 07/21/2024.  Patient was placed on vasopressors and heparin  drip for obstructive shock secondary to pulmonary embolism.  Interventional radiology was consulted and patient underwent pulmonary artery thrombectomy on 07/26/2024.  Patient was transition to Eliquis  on 07/31/2024.  Seen by PT and OT with initial recommendation for [facility] inpatient rehabilitation, unfortunately denied by [facility].  Currently awaiting insurance authorization for SNF placement.  Significant hospital events: 12/4: Admit to ICU, DKA, RVP positive for rhinovirus/enterovirus 12/5: Progressive decline in mentation, intubated for airway protection, central line and A-line placed 12/9: Extubated, A-line removed 12/10: Massive bilateral pulmonary emboli with RV dysfunction/shock, heparin  drip, s/p IR pulmonary artery thrombectomy 12/12: Levophed  weaned off 12/14: Transferred to [facility] 12/15: Heparin  drip transition to Eliquis  12/16: UHC denial for CIR, appealed 12/18: Expedited appeal for [facility] denial upheld 12/21: Awaiting SNF placement 12/22: + Orthostatic vital signs,  will receive IVF bolus/infusion, continue to await SNF placement, pending insurance authorization for [place] [facility] SN[facility] 12/23: Pending insurance authorization for SNF  Assessment & Plan:   Acute hypoxic respiratory failure: Resolved Massive bilateral pulmonary embolism with cor pulmonale s/p IR thrombectomy Obstructive shock secondary to PE: Resolved CT angiogram chest 12/10 with large bilateral pulmonary emboli involving the bilateral pulmonary arteries with lobar branch extension bilaterally with right heart strain consistent with submassive PE, left lower lobe airspace disease consistent with pneumonia versus atelectasis, small left pleural effusion.  Patient was placed on a heparin  drip, IR was consulted and underwent pulmonary artery thrombectomy on 07/26/2024.  Vasser duplex ultrasound bilateral lower extremities negative for DVT.  TTE 07/27/2024 with LVEF 65 to 70%, LV with normal function with no regional wall motion normalities, mild LVH, RV systolic function mildly reduced, no aortic stenosis, aortic dilation measuring 39 mm, IVC normal in size.  Heparin  drip now transition to Eliquis .  Vasopressors weaned off.  Oxygen well on room air. -- Eliquis  5mg  PO BID  Orthostatic hypotension: Resolved Patient overnight when arising to use the bathroom became dizzy.  Orthostatic vital signs positive on 08/07/2024, received IV fluid bolus by continuous infusion with resolution of symptoms.   -- continue to encourage increased oral intake  MSSA pneumonia Rhinovirus infection Streptococcus equinus bacteremia 1 out of 4 blood cultures positive for Streptococcus equinus.  Respiratory viral panel positive for rhinovirus/enterovirus.  Oxygen weaned off.  Repeat blood cultures 07/29/2024 with no growth x 5 days. Completed 10-day course of antibiotics with ceftriaxone .    Acute renal failure Etiology likely secondary to ATN given hypotension, IV contrast and diuretics.  Creatinine 3.82 on  admission; now improved to baseline 0.74.  Diabetic ketoacidosis Type 1 diabetes mellitus  Acute metabolic encephalopathy: Resolved Patient initially presenting to ED after being found unresponsive by family at home.  Upon EMS arrival, glucose elevated to 400.  On arrival to the ED pH 6.99, pCO2 15.3.  Glucose 1045.  Beta hydroxybutyrate acid greater than 8.00.  Urinalysis with 20 ketones.  CT head/C-spine negative for acute process.  Hemoglobin A1c 8.8% on 07/21/2024, poorly controlled.  Initially started on insulin  drip, now transitioned to subcutaneous insulin . Home regimen includes Tresiba 24 units daily, Humalog 5 units daily, Jardiance . -- Lantus  16 u [place] BID -- Novolog  6 u TIDAC  HTN -- Cardizem  120 mg p.o. daily (on 300 mg PO daily at home) -- Metoprolol  tartrate 25 g p.o. twice daily -- Hold home chlorthalidone   HLD -- Hold home atorvastatin  for now  Hypophosphatemia Repleted.  Ascending aorta aneurysm TTE with mild dilation of the ascending aorta measuring 39 mm.  Continue outpatient surveillance.   DVT prophylaxis: Place and maintain sequential compression device Start: 07/26/24 1107 SCDs Start: 07/20/24 2255 apixaban  (ELIQUIS ) tablet 5 mg    Code Status: Full Code Family Communication:   Disposition Plan:  Level of care: Progressive Status is: Inpatient Remains inpatient appropriate because: Pending authorization for SNF placement    Consultants:  PCCM IR  Procedures:  Intubation 12/5 A-line 12/5 Central line 12/5 A-line 12/10 TTE  Antimicrobials:  Unasyn  12/4 - 12/6 Ceftriaxone  12/6 - 12/15   Subjective: Patient seen examined bedside, lying in bed.  Family on speaker phone.   No complaints.  Continue to await insurance authorization for SNF placement.  No other questions or concerns at this time.  Denies headache, no current dizziness, no chest pain, no palpitations, no shortness of breath, no abdominal pain, no fever/chills/night sweats, no  nausea/vomiting/diarrhea, no focal weakness, no fatigue, no paresthesias.  No acute events overnight per nursing staff.  Objective: Vitals:   08/09/24 2040 08/10/24 0500 08/10/24 0526 08/10/24 1318  BP: 113/69  110/68 125/81  Pulse: 87  87 95  Resp: 20  16   Temp: 98.5 F (36.9 C)  98.5 F (36.9 C) 98.2 F (36.8 C)  TempSrc:   Oral Oral  SpO2: 97%  95% 99%  Weight:  96.5 kg    Height:        Intake/Output Summary (Last 24 hours) at 08/10/2024 1348 Last data filed at 08/09/2024 1430 Gross per 24 hour  Intake 120 ml  Output --  Net 120 ml   Filed Weights   08/08/24 0349 08/09/24 0500 08/10/24 0500  Weight: 96 kg 96.6 kg 96.5 kg    Examination:  Physical Exam: GEN: NAD, alert and oriented x 3, wd/wn HEENT: NCAT, PERRL, EOMI, sclera clear, MMM PULM: CTAB w/o wheezes/crackles, normal respiratory effort, on room air CV: RRR w/o M/G/R GI: abd soft, NTND, + BS MSK: no peripheral edema, muscle strength globally intact 5/5 bilateral upper/lower extremities NEURO: No focal neurological deficit PSYCH: normal mood/affect Integumentary: No concerning rash/lesions/wounds noted on exposed skin surfaces    Data Reviewed: I have personally reviewed following labs and imaging studies  CBC: Recent Labs  Lab 08/04/24 0545 08/06/24 0527  WBC 3.2* 3.5*  HGB 11.2* 12.0*  HCT 33.0* 35.4*  MCV 91.7 91.0  PLT 270 383   Basic Metabolic Panel: Recent Labs  Lab 08/04/24 0545 08/06/24 0527  NA 140 142  K 3.5 3.6  CL 103 107  CO2 25 26  GLUCOSE 230* 243*  BUN <5* <5*  CREATININE 0.77 0.74  CALCIUM  8.2* 8.5*  GFR: Estimated Creatinine Clearance: 117.8 mL/min (by C-G formula based on SCr of 0.74 mg/dL). Liver Function Tests: No results for input(s): AST, ALT, ALKPHOS, BILITOT, PROT, ALBUMIN in the last 168 hours. No results for input(s): LIPASE, AMYLASE in the last 168 hours. No results for input(s): AMMONIA in the last 168 hours. Coagulation  Profile: No results for input(s): INR, PROTIME in the last 168 hours. Cardiac Enzymes: No results for input(s): CKTOTAL, CKMB, CKMBINDEX, TROPONINI in the last 168 hours. BNP (last 3 results) Recent Labs    07/26/24 1902  PROBNP 605.0*   HbA1C: No results for input(s): HGBA1C in the last 72 hours. CBG: Recent Labs  Lab 08/09/24 1646 08/09/24 2126 08/09/24 2241 08/10/24 0730 08/10/24 1152  GLUCAP 77 182* 142* 92 179*   Lipid Profile: No results for input(s): CHOL, HDL, LDLCALC, TRIG, CHOLHDL, LDLDIRECT in the last 72 hours. Thyroid  Function Tests: No results for input(s): TSH, T4TOTAL, FREET4, T3FREE, THYROIDAB in the last 72 hours. Anemia Panel: No results for input(s): VITAMINB12, FOLATE, FERRITIN, TIBC, IRON, RETICCTPCT in the last 72 hours. Sepsis Labs: No results for input(s): PROCALCITON, LATICACIDVEN in the last 168 hours.  No results found for this or any previous visit (from the past 240 hours).        Radiology Studies: No results found.      Scheduled Meds:  apixaban   5 mg Oral BID   Chlorhexidine  Gluconate Cloth  6 each Topical Daily   diltiazem   120 mg Oral Daily   docusate  100 mg Oral BID   feeding supplement (GLUCERNA SHAKE)  237 mL Oral BID BM   insulin  aspart  0-9 Units Subcutaneous TID WC   insulin  aspart  6 Units Subcutaneous TID WC   insulin  glargine  16 Units Subcutaneous BID   metoprolol  tartrate  25 mg Oral BID   pneumococcal 20-valent conjugate vaccine  0.5 mL Intramuscular Tomorrow-1000   polyethylene glycol  17 g Oral Daily   sodium chloride  flush  10-40 mL Intracatheter Q12H   Continuous Infusions:     LOS: 21 days    Time spent: 45 minutes spent on 08/10/2024 caring for this patient face-to-face including chart review, ordering labs/tests, documenting, discussion with nursing staff, consultants, updating family and interview/physical exam    Camellia PARAS Leler Brion, DO [facility] Available via Epic secure chat 7am-7pm After these hours, please refer to coverage provider listed on [email or web address] 08/10/2024, 1:48 PM   "

## 2024-08-10 NOTE — Progress Notes (Signed)
 Assumed care from previous RN. Agree with previous assessment. Pt's needs addressed. Call light within reach.

## 2024-08-11 DIAGNOSIS — I2609 Other pulmonary embolism with acute cor pulmonale: Secondary | ICD-10-CM | POA: Diagnosis not present

## 2024-08-11 DIAGNOSIS — E1111 Type 2 diabetes mellitus with ketoacidosis with coma: Secondary | ICD-10-CM | POA: Diagnosis not present

## 2024-08-11 LAB — GLUCOSE, CAPILLARY
Glucose-Capillary: 120 mg/dL — ABNORMAL HIGH (ref 70–99)
Glucose-Capillary: 203 mg/dL — ABNORMAL HIGH (ref 70–99)
Glucose-Capillary: 112 mg/dL — ABNORMAL HIGH (ref 70–99)
Glucose-Capillary: 120 mg/dL — ABNORMAL HIGH (ref 70–99)

## 2024-08-11 NOTE — TOC Progression Note (Signed)
 Transition of Care [facility]) - Progression Note    Patient Details  Name: SAMIN MILKE MRN: [id number] Date of Birth: 1966/01/24  Transition of Care [facility]) CM/SW Contact  Tawni CHRISTELLA Eva, LCSW Phone Number: 08/11/2024, 9:51 AM  Clinical Narrative:     CSW spoke with Colene with [facility] report pt can be admitted once they get Authorization information from the [place].  Attempted to contact [facility] and [facility] Department with [place], no answer left VM requesting a return call. ICM to follow.     Expected Discharge Plan: Skilled Nursing Facility Barriers to Discharge: Insurance Authorization               Expected Discharge Plan and Services   Discharge Planning Services: CM Consult Post Acute Care Choice: Skilled Nursing Facility Living arrangements for the past 2 months: Skilled Nursing Facility                                       Social Drivers of Health (SDOH) Interventions SDOH Screenings   Food Insecurity: No Food Insecurity (07/22/2024)  Housing: Low Risk (07/22/2024)  Transportation Needs: Patient Unable To Answer (07/22/2024)  Utilities: Patient Unable To Answer (07/22/2024)  Tobacco Use: Low Risk (07/20/2024)    Readmission Risk Interventions    07/24/2024    8:55 AM  Readmission Risk Prevention Plan  Post Dischage Appt Complete  Medication Screening Complete  Transportation Screening Complete

## 2024-08-11 NOTE — Progress Notes (Signed)
 " PROGRESS NOTE    Jorge Calhoun  FMW:[id number] DOB: [birth year] DOA: 07/20/2024 PCP: Seabron Lenis, MD    Brief Narrative:   Jorge Calhoun is a 58 y.o. male with past medical history significant for HTN, PSVT, type 1 diabetes mellitus, nontoxic multinodular goiter, obesity who presented to [facility] ED on 07/20/2024 from home via EMS after being found unresponsive.  Apparently patient was feeling generally unwell over the previous 3-4 days with poor oral intake, nausea and vomiting; seen at the [place] and was given something for an infection.  On EMS arrival, patient was noted to be hypotensive with SBP in the 80s, oxygen saturation 100% on 2 L nasal cannula and CBG 400.  Patient was initially admitted to the intensive care unit and placed on insulin  drip for DKA.  Patient's mental status continued to decline and he was subsequently intubated for airway protection on 07/21/2024.  Patient was placed on vasopressors and heparin  drip for obstructive shock secondary to pulmonary embolism.  Interventional radiology was consulted and patient underwent pulmonary artery thrombectomy on 07/26/2024.  Patient was transition to Eliquis  on 07/31/2024.  Seen by PT and OT with initial recommendation for [facility] inpatient rehabilitation, unfortunately denied by [facility].  Currently awaiting insurance authorization for SNF placement.  Significant hospital events: 12/4: Admit to ICU, DKA, RVP positive for rhinovirus/enterovirus 12/5: Progressive decline in mentation, intubated for airway protection, central line and A-line placed 12/9: Extubated, A-line removed 12/10: Massive bilateral pulmonary emboli with RV dysfunction/shock, heparin  drip, s/p IR pulmonary artery thrombectomy 12/12: Levophed  weaned off 12/14: Transferred to [facility] 12/15: Heparin  drip transition to Eliquis  12/16: [employer] denial for CIR, appealed 12/18: Expedited appeal for [facility] denial upheld 12/21: Awaiting SNF placement 12/22: + Orthostatic vital signs,  will receive IVF bolus/infusion, continue to await SNF placement, pending insurance authorization for [facility] 12/23: Pending insurance authorization for SNF  Assessment & Plan:   Acute hypoxic respiratory failure: Resolved Massive bilateral pulmonary embolism with cor pulmonale s/p IR thrombectomy Obstructive shock secondary to PE: Resolved CT angiogram chest 12/10 with large bilateral pulmonary emboli involving the bilateral pulmonary arteries with lobar branch extension bilaterally with right heart strain consistent with submassive PE, left lower lobe airspace disease consistent with pneumonia versus atelectasis, small left pleural effusion.  Patient was placed on a heparin  drip, IR was consulted and underwent pulmonary artery thrombectomy on 07/26/2024.  [place] duplex ultrasound bilateral lower extremities negative for DVT.  TTE 07/27/2024 with LVEF 65 to 70%, LV with normal function with no regional wall motion normalities, mild LVH, RV systolic function mildly reduced, no aortic stenosis, aortic dilation measuring 39 mm, IVC normal in size.  Heparin  drip now transition to Eliquis .  Vasopressors weaned off.  Oxygen well on room air. -- Eliquis  5mg  PO BID  Orthostatic hypotension: Resolved Patient overnight when arising to use the bathroom became dizzy.  Orthostatic vital signs positive on 08/07/2024, received IV fluid bolus by continuous infusion with resolution of symptoms.   -- continue to encourage increased oral intake  MSSA pneumonia Rhinovirus infection Streptococcus equinus bacteremia 1 out of 4 blood cultures positive for Streptococcus equinus.  Respiratory viral panel positive for rhinovirus/enterovirus.  Oxygen weaned off.  Repeat blood cultures 07/29/2024 with no growth x 5 days. Completed 10-day course of antibiotics with ceftriaxone .    Acute renal failure Etiology likely secondary to ATN given hypotension, IV contrast and diuretics.  Creatinine 3.82 on  admission; now improved to baseline 0.74.  Diabetic ketoacidosis Type 1 diabetes mellitus  Acute metabolic encephalopathy: Resolved Patient initially presenting to ED after being found unresponsive by family at home.  Upon EMS arrival, glucose elevated to 400.  On arrival to the ED pH 6.99, pCO2 15.3.  Glucose 1045.  Beta hydroxybutyrate acid greater than 8.00.  Urinalysis with 20 ketones.  CT head/C-spine negative for acute process.  Hemoglobin A1c 8.8% on 07/21/2024, poorly controlled.  Initially started on insulin  drip, now transitioned to subcutaneous insulin . Home regimen includes Tresiba 24 units daily, Humalog 5 units daily, Jardiance . -- Lantus  16 u [place] BID -- Novolog  6 u TIDAC  HTN -- Cardizem  120 mg p.o. daily (on 300 mg PO daily at home) -- Metoprolol  tartrate 25 g p.o. twice daily -- Hold home chlorthalidone   HLD -- Hold home atorvastatin  for now  Hypophosphatemia Repleted.  Ascending aorta aneurysm TTE with mild dilation of the ascending aorta measuring 39 mm.  Continue outpatient surveillance.   DVT prophylaxis: Place and maintain sequential compression device Start: 07/26/24 1107 SCDs Start: 07/20/24 2255 apixaban  (ELIQUIS ) tablet 5 mg    Code Status: Full Code Family Communication:   Disposition Plan:  Level of care: Progressive Status is: Inpatient Remains inpatient appropriate because: Pending authorization for SNF placement    Consultants:  PCCM IR  Procedures:  Intubation 12/5 A-line 12/5 Central line 12/5 A-line 12/10 TTE  Antimicrobials:  Unasyn  12/4 - 12/6 Ceftriaxone  12/6 - 12/15   Subjective: Patient seen examined bedside, sitting in bedside chair.  No complaints.  Continue to await insurance authorization for SNF placement; bed at SNF currently available per SW.  No other questions or concerns at this time.  Denies headache, no current dizziness, no chest pain, no palpitations, no shortness of breath, no abdominal pain, no fever/chills/night  sweats, no nausea/vomiting/diarrhea, no focal weakness, no fatigue, no paresthesias.  No acute events overnight per nursing staff.  Objective: Vitals:   08/10/24 1318 08/10/24 2114 08/11/24 0433 08/11/24 0708  BP: 125/81 131/78 123/77   Pulse: 95 97 79   Resp:  18 18   Temp: 98.2 F (36.8 C) 98.6 F (37 C) 98.6 F (37 C)   TempSrc: Oral     SpO2: 99% 99% 98%   Weight:    96.7 kg  Height:        Intake/Output Summary (Last 24 hours) at 08/11/2024 1318 Last data filed at 08/11/2024 0900 Gross per 24 hour  Intake 1200 ml  Output --  Net 1200 ml   Filed Weights   08/09/24 0500 08/10/24 0500 08/11/24 0708  Weight: 96.6 kg 96.5 kg 96.7 kg    Examination:  Physical Exam: GEN: NAD, alert and oriented x 3, wd/wn HEENT: NCAT, PERRL, EOMI, sclera clear, MMM PULM: CTAB w/o wheezes/crackles, normal respiratory effort, on room air CV: RRR w/o M/G/R GI: abd soft, NTND, + BS MSK: no peripheral edema, muscle strength globally intact 5/5 bilateral upper/lower extremities NEURO: No focal neurological deficit PSYCH: normal mood/affect Integumentary: No concerning rash/lesions/wounds noted on exposed skin surfaces    Data Reviewed: I have personally reviewed following labs and imaging studies  CBC: Recent Labs  Lab 08/06/24 0527  WBC 3.5*  HGB 12.0*  HCT 35.4*  MCV 91.0  PLT 383   Basic Metabolic Panel: Recent Labs  Lab 08/06/24 0527  NA 142  K 3.6  CL 107  CO2 26  GLUCOSE 243*  BUN <5*  CREATININE 0.74  CALCIUM  8.5*   GFR: Estimated Creatinine Clearance: 118 mL/min (by C-G formula based on SCr of 0.74 mg/dL).  Liver Function Tests: No results for input(s): AST, ALT, ALKPHOS, BILITOT, PROT, ALBUMIN in the last 168 hours. No results for input(s): LIPASE, AMYLASE in the last 168 hours. No results for input(s): AMMONIA in the last 168 hours. Coagulation Profile: No results for input(s): INR, PROTIME in the last 168 hours. Cardiac Enzymes: No  results for input(s): CKTOTAL, CKMB, CKMBINDEX, TROPONINI in the last 168 hours. BNP (last 3 results) Recent Labs    07/26/24 1902  PROBNP 605.0*   HbA1C: No results for input(s): HGBA1C in the last 72 hours. CBG: Recent Labs  Lab 08/10/24 1152 08/10/24 1636 08/10/24 2111 08/11/24 0750 08/11/24 1155  GLUCAP 179* 157* 111* 120* 203*   Lipid Profile: No results for input(s): CHOL, HDL, LDLCALC, TRIG, CHOLHDL, LDLDIRECT in the last 72 hours. Thyroid  Function Tests: No results for input(s): TSH, T4TOTAL, FREET4, T3FREE, THYROIDAB in the last 72 hours. Anemia Panel: No results for input(s): VITAMINB12, FOLATE, FERRITIN, TIBC, IRON, RETICCTPCT in the last 72 hours. Sepsis Labs: No results for input(s): PROCALCITON, LATICACIDVEN in the last 168 hours.  No results found for this or any previous visit (from the past 240 hours).        Radiology Studies: No results found.      Scheduled Meds:  apixaban   5 mg Oral BID   Chlorhexidine  Gluconate Cloth  6 each Topical Daily   diltiazem   120 mg Oral Daily   docusate  100 mg Oral BID   feeding supplement (GLUCERNA SHAKE)  237 mL Oral BID BM   insulin  aspart  0-9 Units Subcutaneous TID WC   insulin  aspart  6 Units Subcutaneous TID WC   insulin  glargine  16 Units Subcutaneous BID   metoprolol  tartrate  25 mg Oral BID   pneumococcal 20-valent conjugate vaccine  0.5 mL Intramuscular Tomorrow-1000   polyethylene glycol  17 g Oral Daily   sodium chloride  flush  10-40 mL Intracatheter Q12H   Continuous Infusions:     LOS: 22 days    Time spent: 45 minutes spent on 08/11/2024 caring for this patient face-to-face including chart review, ordering labs/tests, documenting, discussion with nursing staff, consultants, updating family and interview/physical exam    Camellia PARAS Deaunna Olarte, DO [facility] Available via Epic secure chat 7am-7pm After these hours, please refer to  coverage provider listed on [email or web address] 08/11/2024, 1:18 PM   "

## 2024-08-11 NOTE — Progress Notes (Signed)
 Physical Therapy Treatment Patient Details Name: Jorge Calhoun MRN: [id number] DOB: 10/17/1965 Today's Date: 08/11/2024   History of Present Illness Jorge Calhoun is a 58 y/o M who presented on 07/20/24 for DKA likely in the setting of rhinovirus URTI after being found down at home.  During hospitalization- 12/5 decline in respiratory and mentation requiring intubation, 12/9 extubated, 12/10 improving but then SOB when OOB and found to have massive bil PE requiring thrombectomy, 12/11-12/12 levophed  weaned . PMH significant for type I DM, HTN, hyperthyroidism    PT Comments  Pt resting in bed, agreeable to get up and walk. Continues to have slow processing and requires increased time for mobility. Stands at [facility] initially, maintains static standing balance well and is able to remove both hands. Increased gait distance tolerated today to 245ft, continues to require cues for upright posture, wider BOS, increased step height and length. He continues to demonstrate functional gains and will benefit from continued skilled therapy intervention while admitted to hospital and follow up at [facility] for <3hrs/day.     If plan is discharge home, recommend the following: A little help with walking and/or transfers;A little help with bathing/dressing/bathroom;Assistance with cooking/housework;Assist for transportation;Help with stairs or ramp for entrance   Can travel by private vehicle     Yes  Equipment Recommendations  Rolling walker (2 wheels)    Recommendations for Other Services       Precautions / Restrictions Precautions Precautions: Fall Recall of Precautions/Restrictions: Intact Precaution/Restrictions Comments: watch HR and BP Restrictions Weight Bearing Restrictions Per Provider Order: No     Mobility  Bed Mobility   Bed Mobility: Supine to Sit     Supine to sit: HOB elevated, Used rails, Supervision          Transfers Overall transfer level: Needs assistance Equipment  used: Rolling walker (2 wheels) Transfers: Sit to/from Stand Sit to Stand: Contact guard assist           General transfer comment: cues for proper hand placement    Ambulation/Gait Ambulation/Gait assistance: Contact guard assist Gait Distance (Feet): 200 Feet Assistive device: Rolling walker (2 wheels) Gait Pattern/deviations: Decreased stride length, Narrow base of support, Trunk flexed, Step-through pattern, Decreased step length - right, Decreased step length - left Gait velocity: decreased     General Gait Details: decreased step height, intermittently slide feet across floor but corrects with cues, improved distance completed today, continues to require cues for posture and gait mechanics intermittently   Stairs             Wheelchair Mobility     Tilt Bed    Modified Rankin (Stroke Patients Only)       Balance Overall balance assessment: Needs assistance Sitting-balance support: No upper extremity supported, Feet supported Sitting balance-Leahy Scale: Good     Standing balance support: Bilateral upper extremity supported, During functional activity Standing balance-Leahy Scale: Fair Standing balance comment: heavy support on RW, can maintain static standing balance at CGA                            Communication Communication Communication: No apparent difficulties  Cognition Arousal: Alert Behavior During Therapy: WFL for tasks assessed/performed   PT - Cognitive impairments: No apparent impairments                       PT - Cognition Comments: slow processing Following commands: Intact Following commands impaired: Follows one  step commands with increased time, Follows multi-step commands inconsistently    Cueing Cueing Techniques: Verbal cues  Exercises      General Comments        Pertinent Vitals/Pain Pain Assessment Pain Assessment: No/denies pain    Home Living                          Prior  Function            PT Goals (current goals can now be found in the care plan section) Acute Rehab PT Goals Patient Stated Goal: agreed with mobility, treturn home PT Goal Formulation: With patient/family Time For Goal Achievement: 08/25/24 Potential to Achieve Goals: Good Progress towards PT goals: Progressing toward goals;Goals updated    Frequency    Min 3X/week      PT Plan      Co-evaluation              AM-PAC PT 6 Clicks Mobility   Outcome Measure  Help needed turning from your back to your side while in a flat bed without using bedrails?: None Help needed moving from lying on your back to sitting on the side of a flat bed without using bedrails?: None Help needed moving to and from a bed to a chair (including a wheelchair)?: A Little Help needed standing up from a chair using your arms (e.g., wheelchair or bedside chair)?: A Little Help needed to walk in hospital room?: A Little Help needed climbing 3-5 steps with a railing? : A Little 6 Click Score: 20    End of Session Equipment Utilized During Treatment: Gait belt Activity Tolerance: Patient tolerated treatment well Patient left: in chair;with call bell/phone within reach Nurse Communication: Mobility status PT Visit Diagnosis: Unsteadiness on feet (R26.81);Muscle weakness (generalized) (M62.81);Difficulty in walking, not elsewhere classified (R26.2)     Time: 8489-8473 PT Time Calculation (min) (ACUTE ONLY): 16 min  Charges:    $Gait Training: 8-22 mins                       Isaiah DEL. Aalaiyah Yassin, PT, DPT   [employer] 08/11/2024, 3:36 PM

## 2024-08-12 DIAGNOSIS — I2609 Other pulmonary embolism with acute cor pulmonale: Secondary | ICD-10-CM | POA: Diagnosis not present

## 2024-08-12 DIAGNOSIS — E1111 Type 2 diabetes mellitus with ketoacidosis with coma: Secondary | ICD-10-CM | POA: Diagnosis not present

## 2024-08-12 LAB — GLUCOSE, CAPILLARY
Glucose-Capillary: 97 mg/dL (ref 70–99)
Glucose-Capillary: 176 mg/dL — ABNORMAL HIGH (ref 70–99)
Glucose-Capillary: 67 mg/dL — ABNORMAL LOW (ref 70–99)
Glucose-Capillary: 107 mg/dL — ABNORMAL HIGH (ref 70–99)
Glucose-Capillary: 155 mg/dL — ABNORMAL HIGH (ref 70–99)

## 2024-08-12 MED ORDER — INFLUENZA VIRUS VACC SPLIT PF (FLUZONE) 0.5 ML IM SUSY: 0.5000 mL | PREFILLED_SYRINGE | INTRAMUSCULAR | Status: DC

## 2024-08-12 NOTE — Progress Notes (Signed)
 " PROGRESS NOTE    Jorge Calhoun  FMW:[id number] DOB: [birth year] DOA: 07/20/2024 PCP: Seabron Lenis, MD    Brief Narrative:   Jorge Calhoun is a 58 y.o. male with past medical history significant for HTN, PSVT, type 1 diabetes mellitus, nontoxic multinodular goiter, obesity who presented to [facility] ED on 07/20/2024 from home via EMS after being found unresponsive.  Apparently patient was feeling generally unwell over the previous 3-4 days with poor oral intake, nausea and vomiting; seen at the [facility] and was given something for an infection.  On EMS arrival, patient was noted to be hypotensive with SBP in the 80s, oxygen saturation 100% on 2 L nasal cannula and CBG 400.  Patient was initially admitted to the intensive care unit and placed on insulin  drip for DKA.  Patient's mental status continued to decline and he was subsequently intubated for airway protection on 07/21/2024.  Patient was placed on vasopressors and heparin  drip for obstructive shock secondary to pulmonary embolism.  Interventional radiology was consulted and patient underwent pulmonary artery thrombectomy on 07/26/2024.  Patient was transition to Eliquis  on 07/31/2024.  Seen by PT and OT with initial recommendation for [facility] inpatient rehabilitation, unfortunately denied by [facility].  Currently awaiting insurance authorization for SNF placement.  Significant hospital events: 12/4: Admit to ICU, DKA, RVP positive for rhinovirus/enterovirus 12/5: Progressive decline in mentation, intubated for airway protection, central line and A-line placed 12/9: Extubated, A-line removed 12/10: Massive bilateral pulmonary emboli with RV dysfunction/shock, heparin  drip, s/p IR pulmonary artery thrombectomy 12/12: Levophed  weaned off 12/14: Transferred to [facility] 12/15: Heparin  drip transition to Eliquis  12/16: [employer] denial for CIR, appealed 12/18: Expedited appeal for [facility] denial upheld 12/21: Awaiting SNF placement 12/22: + Orthostatic vital signs,  will receive IVF bolus/infusion, continue to await SNF placement, pending insurance authorization for [facility] 12/23: Pending insurance authorization for SNF  Assessment & Plan:   Acute hypoxic respiratory failure: Resolved Massive bilateral pulmonary embolism with cor pulmonale s/p IR thrombectomy Obstructive shock secondary to PE: Resolved CT angiogram chest 12/10 with large bilateral pulmonary emboli involving the bilateral pulmonary arteries with lobar branch extension bilaterally with right heart strain consistent with submassive PE, left lower lobe airspace disease consistent with pneumonia versus atelectasis, small left pleural effusion.  Patient was placed on a heparin  drip, IR was consulted and underwent pulmonary artery thrombectomy on 07/26/2024.  Vasser duplex ultrasound bilateral lower extremities negative for DVT.  TTE 07/27/2024 with LVEF 65 to 70%, LV with normal function with no regional wall motion normalities, mild LVH, RV systolic function mildly reduced, no aortic stenosis, aortic dilation measuring 39 mm, IVC normal in size.  Heparin  drip now transition to Eliquis .  Vasopressors weaned off.  Oxygen well on room air. -- Eliquis  5mg  PO BID  Orthostatic hypotension: Resolved Patient overnight when arising to use the bathroom became dizzy.  Orthostatic vital signs positive on 08/07/2024, received IV fluid bolus by continuous infusion with resolution of symptoms.   -- continue to encourage increased oral intake  MSSA pneumonia Rhinovirus infection Streptococcus equinus bacteremia 1 out of 4 blood cultures positive for Streptococcus equinus.  Respiratory viral panel positive for rhinovirus/enterovirus.  Oxygen weaned off.  Repeat blood cultures 07/29/2024 with no growth x 5 days. Completed 10-day course of antibiotics with ceftriaxone .    Acute renal failure Etiology likely secondary to ATN given hypotension, IV contrast and diuretics.  Creatinine 3.82 on  admission; now improved to baseline 0.74.  Diabetic ketoacidosis Type 1 diabetes mellitus  Acute metabolic encephalopathy: Resolved Patient initially presenting to ED after being found unresponsive by family at home.  Upon EMS arrival, glucose elevated to 400.  On arrival to the ED pH 6.99, pCO2 15.3.  Glucose 1045.  Beta hydroxybutyrate acid greater than 8.00.  Urinalysis with 20 ketones.  CT head/C-spine negative for acute process.  Hemoglobin A1c 8.8% on 07/21/2024, poorly controlled.  Initially started on insulin  drip, now transitioned to subcutaneous insulin . Home regimen includes Tresiba 24 units daily, Humalog 5 units daily, Jardiance . -- Lantus  16 u [place] BID -- Novolog  6 u TIDAC  HTN -- Cardizem  120 mg p.o. daily (on 300 mg PO daily at home) -- Metoprolol  tartrate 25 g p.o. twice daily -- Hold home chlorthalidone   HLD -- Hold home atorvastatin  for now  Hypophosphatemia Repleted.  Ascending aorta aneurysm TTE with mild dilation of the ascending aorta measuring 39 mm.  Continue outpatient surveillance.   DVT prophylaxis: Place and maintain sequential compression device Start: 07/26/24 1107 SCDs Start: 07/20/24 2255 apixaban  (ELIQUIS ) tablet 5 mg    Code Status: Full Code Family Communication:   Disposition Plan:  Level of care: Progressive Status is: Inpatient Remains inpatient appropriate because: Pending authorization for SNF placement    Consultants:  PCCM IR  Procedures:  Intubation 12/5 A-line 12/5 Central line 12/5 A-line 12/10 TTE  Antimicrobials:  Unasyn  12/4 - 12/6 Ceftriaxone  12/6 - 12/15   Subjective: Patient seen examined bedside, sitting in bedside chair.  Eating breakfast.  No complaints.  Continue to await insurance authorization for SNF placement; bed at SNF currently available per SW.  No other questions or concerns at this time.  Denies headache, no current dizziness, no chest pain, no palpitations, no shortness of breath, no abdominal pain, no  fever/chills/night sweats, no nausea/vomiting/diarrhea, no focal weakness, no fatigue, no paresthesias.  No acute events overnight per nursing staff.  Objective: Vitals:   08/11/24 1408 08/11/24 1942 08/12/24 0341 08/12/24 0701  BP: 109/75 108/79 101/66   Pulse: 88 82 75   Resp: 16 19 16    Temp: 98.2 F (36.8 C) 98.8 F (37.1 C) 98.6 F (37 C)   TempSrc: Oral Oral Oral   SpO2: 100% 100% 94%   Weight:    99.4 kg  Height:        Intake/Output Summary (Last 24 hours) at 08/12/2024 1254 Last data filed at 08/12/2024 1010 Gross per 24 hour  Intake 480 ml  Output --  Net 480 ml   Filed Weights   08/10/24 0500 08/11/24 0708 08/12/24 0701  Weight: 96.5 kg 96.7 kg 99.4 kg    Examination:  Physical Exam: GEN: NAD, alert and oriented x 3, wd/wn HEENT: NCAT, PERRL, EOMI, sclera clear, MMM PULM: CTAB w/o wheezes/crackles, normal respiratory effort, on room air CV: RRR w/o M/G/R GI: abd soft, NTND, + BS MSK: no peripheral edema, muscle strength globally intact 5/5 bilateral upper/lower extremities NEURO: No focal neurological deficit PSYCH: normal mood/affect Integumentary: No concerning rash/lesions/wounds noted on exposed skin surfaces    Data Reviewed: I have personally reviewed following labs and imaging studies  CBC: Recent Labs  Lab 08/06/24 0527  WBC 3.5*  HGB 12.0*  HCT 35.4*  MCV 91.0  PLT 383   Basic Metabolic Panel: Recent Labs  Lab 08/06/24 0527  NA 142  K 3.6  CL 107  CO2 26  GLUCOSE 243*  BUN <5*  CREATININE 0.74  CALCIUM  8.5*   GFR: Estimated Creatinine Clearance: 119.4 mL/min (by C-G formula based on SCr  of 0.74 mg/dL). Liver Function Tests: No results for input(s): AST, ALT, ALKPHOS, BILITOT, PROT, ALBUMIN in the last 168 hours. No results for input(s): LIPASE, AMYLASE in the last 168 hours. No results for input(s): AMMONIA in the last 168 hours. Coagulation Profile: No results for input(s): INR, PROTIME in the last  168 hours. Cardiac Enzymes: No results for input(s): CKTOTAL, CKMB, CKMBINDEX, TROPONINI in the last 168 hours. BNP (last 3 results) Recent Labs    07/26/24 1902  PROBNP 605.0*   HbA1C: No results for input(s): HGBA1C in the last 72 hours. CBG: Recent Labs  Lab 08/11/24 1155 08/11/24 1708 08/11/24 2121 08/12/24 0723 08/12/24 1114  GLUCAP 203* 112* 120* 97 176*   Lipid Profile: No results for input(s): CHOL, HDL, LDLCALC, TRIG, CHOLHDL, LDLDIRECT in the last 72 hours. Thyroid  Function Tests: No results for input(s): TSH, T4TOTAL, FREET4, T3FREE, THYROIDAB in the last 72 hours. Anemia Panel: No results for input(s): VITAMINB12, FOLATE, FERRITIN, TIBC, IRON, RETICCTPCT in the last 72 hours. Sepsis Labs: No results for input(s): PROCALCITON, LATICACIDVEN in the last 168 hours.  No results found for this or any previous visit (from the past 240 hours).        Radiology Studies: No results found.      Scheduled Meds:  apixaban   5 mg Oral BID   Chlorhexidine  Gluconate Cloth  6 each Topical Daily   diltiazem   120 mg Oral Daily   docusate  100 mg Oral BID   feeding supplement (GLUCERNA SHAKE)  237 mL Oral BID BM   [START ON 08/13/2024] influenza vac split trivalent PF  0.5 mL Intramuscular Tomorrow-1000   insulin  aspart  0-9 Units Subcutaneous TID WC   insulin  aspart  6 Units Subcutaneous TID WC   insulin  glargine  16 Units Subcutaneous BID   metoprolol  tartrate  25 mg Oral BID   pneumococcal 20-valent conjugate vaccine  0.5 mL Intramuscular Tomorrow-1000   polyethylene glycol  17 g Oral Daily   sodium chloride  flush  10-40 mL Intracatheter Q12H   Continuous Infusions:     LOS: 23 days    Time spent: 45 minutes spent on 08/12/2024 caring for this patient face-to-face including chart review, ordering labs/tests, documenting, discussion with nursing staff, consultants, updating family and interview/physical  exam    Camellia PARAS Shawnita Krizek, DO [facility] Available via Epic secure chat 7am-7pm After these hours, please refer to coverage provider listed on [email or web address] 08/12/2024, 12:54 PM   "

## 2024-08-13 DIAGNOSIS — E1111 Type 2 diabetes mellitus with ketoacidosis with coma: Secondary | ICD-10-CM | POA: Diagnosis not present

## 2024-08-13 LAB — GLUCOSE, CAPILLARY
Glucose-Capillary: 134 mg/dL — ABNORMAL HIGH (ref 70–99)
Glucose-Capillary: 156 mg/dL — ABNORMAL HIGH (ref 70–99)
Glucose-Capillary: 282 mg/dL — ABNORMAL HIGH (ref 70–99)
Glucose-Capillary: 167 mg/dL — ABNORMAL HIGH (ref 70–99)

## 2024-08-13 LAB — CBC
HCT: 35.7 % — ABNORMAL LOW (ref 39.0–52.0)
Hemoglobin: 11.9 g/dL — ABNORMAL LOW (ref 13.0–17.0)
MCH: 31.6 pg (ref 26.0–34.0)
MCHC: 33.3 g/dL (ref 30.0–36.0)
MCV: 94.7 fL (ref 80.0–100.0)
Platelets: 385 K/uL (ref 150–400)
RBC: 3.77 MIL/uL — ABNORMAL LOW (ref 4.22–5.81)
RDW: 14.7 % (ref 11.5–15.5)
WBC: 3.9 K/uL — ABNORMAL LOW (ref 4.0–10.5)
nRBC: 0 % (ref 0.0–0.2)

## 2024-08-13 LAB — BASIC METABOLIC PANEL WITH GFR
Anion gap: 7 (ref 5–15)
BUN: 5 mg/dL — ABNORMAL LOW (ref 6–20)
CO2: 31 mmol/L (ref 22–32)
Calcium: 8.7 mg/dL — ABNORMAL LOW (ref 8.9–10.3)
Chloride: 104 mmol/L (ref 98–111)
Creatinine, Ser: 0.9 mg/dL (ref 0.61–1.24)
GFR, Estimated: >60 mL/min
Glucose, Bld: 108 mg/dL — ABNORMAL HIGH (ref 70–99)
Potassium: 3 mmol/L — ABNORMAL LOW (ref 3.5–5.1)
Sodium: 142 mmol/L (ref 135–145)

## 2024-08-13 LAB — MAGNESIUM: Magnesium: 2.1 mg/dL (ref 1.7–2.4)

## 2024-08-13 MED ORDER — POTASSIUM CHLORIDE CRYS ER 20 MEQ PO TBCR
40.0000 meq | EXTENDED_RELEASE_TABLET | ORAL | Status: AC
  Administered 2024-08-13 (×2): 40 meq via ORAL
  Filled 2024-08-13 (×2): qty 2

## 2024-08-13 NOTE — TOC Progression Note (Signed)
 Transition of Care [facility]) - Progression Note    Patient Details  Name: Jorge Calhoun MRN: [id number] Date of Birth: [birth year]  [facility] of [facility] CM/SW Contact  Heather DELENA Saltness, LCSW Phone Number: 08/13/2024, 10:50 AM  Clinical Narrative:    CSW attempted to speak with admissions staff at [facility] via phone call regarding insurance authorization. Per front desk staff, admissions staff will return tomorrow 12/29. TOC will follow up tomorrow.   Expected Discharge Plan: Skilled Nursing Facility Barriers to Discharge: Insurance Authorization   Expected Discharge Plan and Services   Discharge Planning Services: CM Consult Post Acute Care Choice: Skilled Nursing Facility Living arrangements for the past 2 months: Skilled Nursing Facility                   Social Drivers of Health (SDOH) Interventions SDOH Screenings   Food Insecurity: No Food Insecurity (07/22/2024)  Housing: Low Risk (07/22/2024)  Transportation Needs: Patient Unable To Answer (07/22/2024)  Utilities: Patient Unable To Answer (07/22/2024)  Tobacco Use: Low Risk (07/20/2024)    Readmission Risk Interventions    07/24/2024    8:55 AM  Readmission Risk Prevention Plan  Post Dischage Appt Complete  Medication Screening Complete  Transportation Screening Complete    Signed: Heather Saltness, MSW, LCSW Clinical Social Worker Inpatient Care Management 08/13/2024 10:52 AM

## 2024-08-13 NOTE — Progress Notes (Signed)
 " PROGRESS NOTE  DANILO CAPPIELLO  FMW:[id number] DOB: 11/18/1965 DOA: 07/20/2024 PCP: Seabron Lenis, MD   Brief Narrative: Jorge Calhoun is a 58 y.o. male with past medical history significant for HTN, PSVT, type 1 diabetes mellitus, nontoxic multinodular goiter, obesity who presented to [facility] ED on 07/20/2024 from home via EMS after being found unresponsive.  Apparently patient was feeling generally unwell over the previous 3-4 days with poor oral intake, nausea and vomiting; seen at the [facility] and was given something for an infection.  On EMS arrival, patient was noted to be hypotensive with SBP in the 80s, oxygen saturation 100% on 2 L nasal cannula and CBG 400. Patient was initially admitted to the intensive care unit and placed on insulin  drip for DKA.  Patient's mental status continued to decline and he was subsequently intubated for airway protection on 07/21/2024.  Patient was placed on vasopressors and heparin  drip for obstructive shock secondary to pulmonary embolism.  Interventional radiology was consulted and patient underwent pulmonary artery thrombectomy on 07/26/2024.  Patient was transition to Eliquis  on 07/31/2024.  Seen by PT and OT with initial recommendation for [facility] inpatient rehabilitation, unfortunately denied by [facility]. Currently hemodynamically stable .  Medically stable for discharge to SNF.  TOC following  Assessment & Plan:  Principal Problem:   DKA (diabetic ketoacidosis) (HCC) Active Problems:   DKA, type 1 (HCC)   Acute saddle pulmonary embolism with acute cor pulmonale (HCC)   Acute hypoxic respiratory failure: Resolved Massive bilateral pulmonary embolism with cor pulmonale s/p IR thrombectomy Obstructive shock secondary to PE: Resolved CT angiogram chest 12/10 with large bilateral pulmonary emboli involving the bilateral pulmonary arteries with lobar branch extension bilaterally with right heart strain consistent with submassive PE, left lower lobe airspace disease  consistent with pneumonia versus atelectasis, small left pleural effusion.  Patient was placed on a heparin  drip, IR was consulted and underwent pulmonary artery thrombectomy on 07/26/2024.  Vasser duplex ultrasound bilateral lower extremities negative for DVT.  TTE 07/27/2024 with LVEF 65 to 70%, LV with normal function with no regional wall motion normalities, mild LVH, RV systolic function mildly reduced, no aortic stenosis, aortic dilation measuring 39 mm, IVC normal in size.  Heparin  drip now transition to Eliquis .  Vasopressors weaned off.  Oxygen well on room air. -- Continue Eliquis  5mg  PO BID    MSSA pneumonia Rhinovirus infection Streptococcus equinus bacteremia 1 out of 4 blood cultures positive for Streptococcus equinus.  Respiratory viral panel positive for rhinovirus/enterovirus.  Oxygen weaned off.  Repeat blood cultures 07/29/2024 with no growth x 5 days. Completed 10-day course of antibiotics with ceftriaxone .     Acute renal failure Etiology likely secondary to ATN given hypotension, IV contrast and diuretics.  Creatinine 3.82 on admission; now normal   Diabetic ketoacidosis Type 1 diabetes mellitus Acute metabolic encephalopathy: Resolved Patient initially presenting to ED after being found unresponsive by family at home.  Upon EMS arrival, glucose elevated to 400.  On arrival to the ED pH 6.99, pCO2 15.3.  Glucose 1045.  Beta hydroxybutyrate acid greater than 8.00.  Urinalysis with 20 ketones.  CT head/C-spine negative for acute process.  Hemoglobin A1c 8.8% on 07/21/2024, poorly controlled.  Initially started on insulin  drip, now transitioned to subcutaneous insulin . Home regimen includes Tresiba 24 units daily, Humalog 5 units daily, Jardiance . -- Lantus  16 u  BID -- Novolog  6 u TIDAC   HTN -- Cardizem  120 mg p.o. daily (on 300 mg PO daily at home) --  Metoprolol  tartrate 25 g p.o. twice daily -- Hold home chlorthalidone    HLD -- Hold home atorvastatin  for now    Hypophosphatemia/hypokalemia Repleted.  Being monitored   Ascending aorta aneurysm TTE with mild dilation of the ascending aorta measuring 39 mm.  Continue outpatient surveillance.   Nutrition Problem: Inadequate oral intake Etiology: inability to eat Wound 07/27/24 2100 Pressure Injury Heel Right Deep Tissue Pressure Injury - Purple or maroon localized area of discolored intact skin or blood-filled blister due to damage of underlying soft tissue from pressure and/or shear. (Active)    DVT prophylaxis:Place and maintain sequential compression device Start: 07/26/24 1107 SCDs Start: 07/20/24 2255 apixaban  (ELIQUIS ) tablet 5 mg     Code Status: Full Code  Family Communication: None at bedside  Patient status:Inpatient  Patient is from :home  Anticipated discharge to:SNF  Estimated DC date:whenever possible   Consultants: PCCM  Procedures: Thrombectomy, intubation  Antimicrobials:  Anti-infectives (From admission, onward)    Start     Dose/Rate Route Frequency Ordered Stop   07/27/24 1230  cefTRIAXone  (ROCEPHIN ) 2 g in sodium chloride  0.9 % 100 mL IVPB        2 g 200 mL/hr over 30 Minutes Intravenous Daily 07/27/24 1140 07/31/24 0941   07/22/24 1600  cefTRIAXone  (ROCEPHIN ) 2 g in sodium chloride  0.9 % 100 mL IVPB  Status:  Discontinued        2 g 200 mL/hr over 30 Minutes Intravenous Every 24 hours 07/22/24 1519 07/27/24 1134   07/22/24 0800  Ampicillin -Sulbactam (UNASYN ) 3 g in sodium chloride  0.9 % 100 mL IVPB  Status:  Discontinued        3 g 200 mL/hr over 30 Minutes Intravenous Every 6 hours 07/22/24 0718 07/22/24 1519   07/21/24 0845  amphotericin B  liposome (AMBISOME ) 440 mg in dextrose  5 % 500 mL IVPB  Status:  Discontinued       Note to Pharmacy: Empiric mucor coverage   4 mg/kg  108.9 kg 305 mL/hr over 120 Minutes Intravenous Every 24 hours 07/21/24 0651 07/21/24 0822   07/21/24 0000  Ampicillin -Sulbactam (UNASYN ) 3 g in sodium chloride  0.9 % 100 mL IVPB   Status:  Discontinued        3 g 200 mL/hr over 30 Minutes Intravenous Every 12 hours 07/20/24 2300 07/22/24 0718       Subjective: Patient seen and examined at bedside today.  He is very comfortable.  Sitting on the chair.  No new complaints.  Waiting for SNF bed  Objective: Vitals:   08/12/24 1344 08/12/24 2155 08/13/24 0444 08/13/24 0500  BP: 113/78 112/70 93/64   Pulse: 76 85 69   Resp: 17 18 19    Temp: 98.4 F (36.9 C) 98.3 F (36.8 C) 98.9 F (37.2 C)   TempSrc: Oral Oral Oral   SpO2: 100% 92% 99%   Weight:    99.1 kg  Height:        Intake/Output Summary (Last 24 hours) at 08/13/2024 1029 Last data filed at 08/12/2024 2158 Gross per 24 hour  Intake 240 ml  Output --  Net 240 ml   Filed Weights   08/11/24 0708 08/12/24 0701 08/13/24 0500  Weight: 96.7 kg 99.4 kg 99.1 kg    Examination:  General exam: Overall comfortable, not in distress, obese HEENT: PERRL Respiratory system:  no wheezes or crackles  Cardiovascular system: S1 & S2 heard, RRR.  Gastrointestinal system: Abdomen is nondistended, soft and nontender. Central nervous system: Alert and oriented Extremities: No  edema, no clubbing ,no cyanosis Skin: No rashes, no ulcers,no icterus     Data Reviewed: I have personally reviewed following labs and imaging studies  CBC: Recent Labs  Lab 08/13/24 0417  WBC 3.9*  HGB 11.9*  HCT 35.7*  MCV 94.7  PLT 385   Basic Metabolic Panel: Recent Labs  Lab 08/13/24 0417 08/13/24 0457  NA 142  --   K 3.0*  --   CL 104  --   CO2 31  --   GLUCOSE 108*  --   BUN 5*  --   CREATININE 0.90  --   CALCIUM  8.7*  --   MG  --  2.1     No results found for this or any previous visit (from the past 240 hours).   Radiology Studies: No results found.  Scheduled Meds:  apixaban   5 mg Oral BID   Chlorhexidine  Gluconate Cloth  6 each Topical Daily   diltiazem   120 mg Oral Daily   docusate  100 mg Oral BID   feeding supplement (GLUCERNA SHAKE)  237 mL  Oral BID BM   influenza vac split trivalent PF  0.5 mL Intramuscular Tomorrow-1000   insulin  aspart  0-9 Units Subcutaneous TID WC   insulin  aspart  6 Units Subcutaneous TID WC   insulin  glargine  16 Units Subcutaneous BID   metoprolol  tartrate  25 mg Oral BID   pneumococcal 20-valent conjugate vaccine  0.5 mL Intramuscular Tomorrow-1000   polyethylene glycol  17 g Oral Daily   potassium chloride   40 mEq Oral Q2H   sodium chloride  flush  10-40 mL Intracatheter Q12H   Continuous Infusions:   LOS: 24 days   Ivonne Mustache, MD [facility] P12/28/2025, 10:29 AM  "

## 2024-08-13 NOTE — Progress Notes (Signed)
" °   08/13/24 2210  BiPAP/CPAP/SIPAP  BiPAP/CPAP/SIPAP Pt Type Adult  BiPAP/CPAP/SIPAP Resmed  Mask Type Nasal pillows  FiO2 (%) 21 %  Patient Home Machine Yes  Safety Check Completed by RT for Home Unit Yes, no issues noted  Patient Home Mask Yes  Patient Home Tubing Yes  Device Plugged into RED Power Outlet Yes   Pt places himself on home unit. RT available as needed.  "

## 2024-08-14 DIAGNOSIS — E1111 Type 2 diabetes mellitus with ketoacidosis with coma: Secondary | ICD-10-CM | POA: Diagnosis not present

## 2024-08-14 LAB — BASIC METABOLIC PANEL WITH GFR
Anion gap: 8 (ref 5–15)
BUN: 6 mg/dL (ref 6–20)
CO2: 29 mmol/L (ref 22–32)
Calcium: 8.6 mg/dL — ABNORMAL LOW (ref 8.9–10.3)
Chloride: 107 mmol/L (ref 98–111)
Creatinine, Ser: 0.9 mg/dL (ref 0.61–1.24)
GFR, Estimated: >60 mL/min
Glucose, Bld: 84 mg/dL (ref 70–99)
Potassium: 3.2 mmol/L — ABNORMAL LOW (ref 3.5–5.1)
Sodium: 144 mmol/L (ref 135–145)

## 2024-08-14 LAB — GLUCOSE, CAPILLARY
Glucose-Capillary: 100 mg/dL — ABNORMAL HIGH (ref 70–99)
Glucose-Capillary: 164 mg/dL — ABNORMAL HIGH (ref 70–99)

## 2024-08-14 MED ORDER — INSULIN GLARGINE 100 UNIT/ML ~~LOC~~ SOLN: 16.0000 [IU] | Freq: Two times a day (BID) | SUBCUTANEOUS | Status: AC

## 2024-08-14 MED ORDER — POLYETHYLENE GLYCOL 3350 17 G PO PACK: 17.0000 g | PACK | Freq: Every day | ORAL | Status: AC

## 2024-08-14 MED ORDER — INSULIN ASPART 100 UNIT/ML IJ SOLN: 6.0000 [IU] | Freq: Three times a day (TID) | INTRAMUSCULAR | Status: AC

## 2024-08-14 MED ORDER — APIXABAN 5 MG PO TABS: 5.0000 mg | ORAL_TABLET | Freq: Two times a day (BID) | ORAL | Status: AC

## 2024-08-14 MED ORDER — POTASSIUM CHLORIDE CRYS ER 20 MEQ PO TBCR
40.0000 meq | EXTENDED_RELEASE_TABLET | Freq: Once | ORAL | Status: AC
  Administered 2024-08-14: 40 meq via ORAL
  Filled 2024-08-14: qty 2

## 2024-08-14 MED ORDER — METOPROLOL TARTRATE 25 MG PO TABS: 25.0000 mg | ORAL_TABLET | Freq: Two times a day (BID) | ORAL | Status: AC

## 2024-08-14 MED ORDER — DILTIAZEM HCL ER COATED BEADS 120 MG PO CP24: 120.0000 mg | ORAL_CAPSULE | Freq: Every day | ORAL | Status: AC

## 2024-08-14 NOTE — Discharge Summary (Signed)
 Physician Discharge Summary  Jorge Calhoun FMW:[id number] DOB: March 21, 1966 DOA: 07/20/2024  PCP: Seabron Lenis, MD  Admit date: 07/20/2024 Discharge date: 08/14/2024  Admitted From: Home Disposition:  SNF  Discharge Condition:Stable CODE STATUS:FULL Diet recommendation: Carb Modified  Brief/Interim Summary: 1 diabetes mellitus, nontoxic multinodular goiter, obesity who presented to [facility] ED on 07/20/2024 from home via EMS after being found unresponsive.  Apparently patient was feeling generally unwell over the previous 3-4 days with poor oral intake, nausea and vomiting; seen at the [facility] and was given something for an infection.  On EMS arrival, patient was noted to be hypotensive with SBP in the 80s, oxygen saturation 100% on 2 L nasal cannula and CBG 400. Patient was initially admitted to the intensive care unit and placed on insulin  drip for DKA.  Patient's mental status continued to decline and he was subsequently intubated for airway protection on 07/21/2024.  Patient was placed on vasopressors and heparin  drip for obstructive shock secondary to pulmonary embolism.  Interventional radiology was consulted and patient underwent pulmonary artery thrombectomy on 07/26/2024.  Patient was transitioned to Eliquis  on 07/31/2024.  Remains hemodynamically stable.  Medically stable for discharge to SNF today.  Following problems were addressed during the hospitalization:  Acute hypoxic respiratory failure: Resolved Massive bilateral pulmonary embolism with cor pulmonale s/p IR thrombectomy Obstructive shock secondary to PE: Resolved CT angiogram chest 12/10 with large bilateral pulmonary emboli involving the bilateral pulmonary arteries with lobar branch extension bilaterally with right heart strain consistent with submassive PE, left lower lobe airspace disease consistent with pneumonia versus atelectasis, small left pleural effusion.  Patient was placed on a heparin  drip, IR was consulted and  underwent pulmonary artery thrombectomy on 07/26/2024.  [place]asser duplex ultrasound bilateral lower extremities negative for DVT.  TTE 07/27/2024 with LVEF 65 to 70%, LV with normal function with no regional wall motion normalities, mild LVH, RV systolic function mildly reduced, no aortic stenosis, aortic dilation measuring 39 mm, IVC normal in size.  Heparin  drip now transition to Eliquis .  Vasopressors weaned off.  Oxygen well on room air. -- Continue Eliquis  5mg  PO BID    MSSA pneumonia Rhinovirus infection Streptococcus equinus bacteremia 1 out of 4 blood cultures positive for Streptococcus equinus.  Respiratory viral panel positive for rhinovirus/enterovirus.  Oxygen weaned off.  Repeat blood cultures 07/29/2024 with no growth x 5 days. Completed 10-day course of antibiotics with ceftriaxone .     Acute renal failure Etiology likely secondary to ATN given hypotension, IV contrast and diuretics.  Creatinine 3.82 on admission; now normal   Diabetic ketoacidosis Type 1 diabetes mellitus Acute metabolic encephalopathy: Resolved Patient initially presenting to ED after being found unresponsive by family at home.  Upon EMS arrival, glucose elevated to 400.  On arrival to the ED pH 6.99, pCO2 15.3.  Glucose 1045.  Beta hydroxybutyrate acid greater than 8.00.  Urinalysis with 20 ketones.  CT head/C-spine negative for acute process.  Hemoglobin A1c 8.8% on 07/21/2024, poorly controlled.  Initially started on insulin  drip, now transitioned to subcutaneous insulin . Home regimen includes Tresiba 24 units daily, Humalog 5 units daily, Jardiance . Continue: -- Lantus  16 u Colbert BID -- Novolog  6 u TIDAC   HTN -- Cardizem  120 mg p.o. daily (on 300 mg PO daily at home) -- Metoprolol  tartrate 25 g p.o. twice daily -- Hold home chlorthalidone    HLD -- Can resume home Lipitor   Hypophosphatemia/hypokalemia Continue potassium supplementation   Ascending aorta aneurysm TTE with mild dilation of the ascending  aorta measuring 39 mm.  Continue outpatient surveillance.     Discharge Diagnoses:  Principal Problem:   DKA (diabetic ketoacidosis) (HCC) Active Problems:   DKA, type 1 (HCC)   Acute saddle pulmonary embolism with acute cor pulmonale [facility])    Discharge Instructions  Discharge Instructions     Diet Carb Modified   Complete by: As directed    Discharge instructions   Complete by: As directed    1)Please take your medications as instructed 2)Monitor your blood sugars   Increase activity slowly   Complete by: As directed    No wound care   Complete by: As directed       Allergies as of 08/14/2024       Reactions   Jardiance  [empagliflozin ] Other (See Comments)   Precipitated DKA   Metformin And Related Nausea Only        Medication List     STOP taking these medications    amoxicillin-clavulanate 875-125 MG tablet Commonly known as: AUGMENTIN   aspirin  81 MG tablet   chlorthalidone  25 MG tablet Commonly known as: HYGROTON    insulin  lispro 100 UNIT/ML injection Commonly known as: HUMALOG   metoprolol  succinate 50 MG 24 hr tablet Commonly known as: Toprol  XL   Tresiba FlexTouch 100 UNIT/ML FlexTouch Pen Generic drug: insulin  degludec       TAKE these medications    apixaban  5 MG Tabs tablet Commonly known as: ELIQUIS  Take 1 tablet (5 mg total) by mouth 2 (two) times daily.   atorvastatin  40 MG tablet Commonly known as: LIPITOR Take 1 tablet (40 mg total) by mouth daily.   benzonatate 100 MG capsule Commonly known as: TESSALON Take 200 mg by mouth 3 (three) times daily as needed for cough.   chlorpheniramine 4 MG tablet Commonly known as: CHLOR-TRIMETON Take 4 mg by mouth every 4 (four) hours as needed (Drainage).   diltiazem  120 MG 24 hr capsule Commonly known as: CARDIZEM  CD Take 1 capsule (120 mg total) by mouth daily. Start taking on: August 15, 2024 What changed:  medication strength how much to take   empagliflozin  25 MG  Tabs tablet Commonly known as: JARDIANCE  Take 12.5 mg by mouth daily.   guaiFENesin  600 MG 12 hr tablet Commonly known as: MUCINEX  Take 600 mg by mouth 2 (two) times daily as needed (Sinus/Chest Congestion).   insulin  aspart 100 UNIT/ML injection Commonly known as: novoLOG  Inject 6 Units into the skin 3 (three) times daily with meals.   insulin  glargine 100 UNIT/ML injection Commonly known as: LANTUS  Inject 0.16 mLs (16 Units total) into the skin 2 (two) times daily.   meloxicam 15 MG tablet Commonly known as: MOBIC Take 15 mg by mouth as needed for pain.   metoprolol  tartrate 25 MG tablet Commonly known as: LOPRESSOR  Take 1 tablet (25 mg total) by mouth 2 (two) times daily.   multivitamin capsule Take 1 capsule by mouth daily.   ondansetron  4 MG disintegrating tablet Commonly known as: ZOFRAN -ODT Take 4 mg by mouth 3 (three) times daily as needed for nausea or vomiting.   polyethylene glycol 17 g packet Commonly known as: MIRALAX  / GLYCOLAX  Take 17 g by mouth daily. Start taking on: August 15, 2024   potassium chloride  SA 20 MEQ tablet Commonly known as: Klor-Con  M20 Take 2 tablets (40 mEq total) by mouth daily.        Contact information for after-discharge care     Destination     [facility] .  Service: Skilled Nursing Contact information: [address] [place]  [address] [phone]                    Allergies[1]  Consultations: PCCM   Procedures/Studies: DG CHEST PORT 1 VIEW Result Date: 07/29/2024 CLINICAL DATA:  Hypoxia.  Found down and unresponsive. EXAM: PORTABLE CHEST 1 VIEW COMPARISON:  07/26/2024 FINDINGS: Stable mildly enlarged cardiac silhouette, accentuated by poor inspiration in the portable AP technique. Mild left lower lobe atelectasis. The remainder of the lungs are clear with normal vascularity. Interval right PICC with its tip at the superior cavoatrial junction. Unremarkable bones. No  fracture or pneumothorax seen. IMPRESSION: 1. Mild left lower lobe atelectasis. 2. Stable mild cardiomegaly. Electronically Signed   By: Elspeth Bathe M.D.   On: 07/29/2024 11:51   VAS US  LOWER EXTREMITY VENOUS (DVT) Result Date: 07/27/2024  Lower Venous DVT Study Patient Name:  LENORRIS KARGER [facility]  Date of Exam:   07/27/2024 Medical Rec #: [id number]          Accession #:    [id number] Date of Birth: April 30, 1966          Patient Gender: M Patient Age:   22 years Exam Location:  [facility] Procedure:      VAS US  LOWER EXTREMITY VENOUS (DVT) Referring Phys: VINA SIMPSON --------------------------------------------------------------------------------  Indications: Pulmonary embolism.  Risk Factors: Confirmed PE. Anticoagulation: Heparin . Limitations: Poor ultrasound/tissue interface. Comparison Study: No prior studies. Performing Technologist: Cordella Collet RVT  Examination Guidelines: A complete evaluation includes B-mode imaging, spectral Doppler, color Doppler, and power Doppler as needed of all accessible portions of each vessel. Bilateral testing is considered an integral part of a complete examination. Limited examinations for reoccurring indications may be performed as noted. The reflux portion of the exam is performed with the patient in reverse Trendelenburg.  +---------+---------------+---------+-----------+----------+--------------+ RIGHT    CompressibilityPhasicitySpontaneityPropertiesThrombus Aging +---------+---------------+---------+-----------+----------+--------------+ CFV      Full           Yes      Yes                                 +---------+---------------+---------+-----------+----------+--------------+ SFJ      Full                                                        +---------+---------------+---------+-----------+----------+--------------+ FV Prox  Full                                                         +---------+---------------+---------+-----------+----------+--------------+ FV Mid   Full                                                        +---------+---------------+---------+-----------+----------+--------------+ FV DistalFull                                                        +---------+---------------+---------+-----------+----------+--------------+  PFV      Full                                                        +---------+---------------+---------+-----------+----------+--------------+ POP      Full           Yes      Yes                                 +---------+---------------+---------+-----------+----------+--------------+ PTV      Full                                                        +---------+---------------+---------+-----------+----------+--------------+ PERO     Full                                                        +---------+---------------+---------+-----------+----------+--------------+   +---------+---------------+---------+-----------+----------+-------------------+ LEFT     CompressibilityPhasicitySpontaneityPropertiesThrombus Aging      +---------+---------------+---------+-----------+----------+-------------------+ CFV      Full           Yes      Yes                                      +---------+---------------+---------+-----------+----------+-------------------+ SFJ      Full                                                             +---------+---------------+---------+-----------+----------+-------------------+ FV Prox  Full                                                             +---------+---------------+---------+-----------+----------+-------------------+ FV Mid   Full                                                             +---------+---------------+---------+-----------+----------+-------------------+ FV DistalFull           Yes      Yes                                       +---------+---------------+---------+-----------+----------+-------------------+ PFV      Full                                                             +---------+---------------+---------+-----------+----------+-------------------+  POP      Full           Yes      Yes                                      +---------+---------------+---------+-----------+----------+-------------------+ PTV      Full                                                             +---------+---------------+---------+-----------+----------+-------------------+ PERO                                                  Not well visualized +---------+---------------+---------+-----------+----------+-------------------+     Summary: RIGHT: - There is no evidence of deep vein thrombosis in the lower extremity.  - No cystic structure found in the popliteal fossa.  LEFT: - There is no evidence of deep vein thrombosis in the lower extremity. However, portions of this examination were limited- see technologist comments above.  - No cystic structure found in the popliteal fossa.  *See table(s) above for measurements and observations. Electronically signed by Debby Robertson on 07/27/2024 at 10:21:04 PM.    Final    ECHOCARDIOGRAM COMPLETE Result Date: 07/27/2024    ECHOCARDIOGRAM REPORT   Patient Name:   KYROS SALZWEDEL [facility] Date of Exam: 07/27/2024 Medical Rec #:  [id number]         Height:       71.0 in Accession #:    [id number]        Weight:       201.5 lb Date of Birth:  07/05/1966         BSA:          2.115 m Patient Age:    58 years          BP:           112/66 mmHg Patient Gender: M                 HR:           104 bpm. Exam Location:  Inpatient Procedure: 2D Echo (Both Spectral and Color Flow Doppler were utilized during            procedure). Indications:    shock, pulmonary embolus  History:        Patient has prior history of Echocardiogram examinations. Risk                 Factors:Hypertension.   Sonographer:    Charmaine Gaskins Referring Phys: [phone] PAULA B SIMPSON  Sonographer Comments: Image acquisition challenging due to respiratory motion and supine position. IMPRESSIONS  1. Possible small LVOT gradient due to hyperdynamic LV function not well characterized on doppler. Left ventricular ejection fraction, by estimation, is 65 to 70%. The left ventricle has normal function. The left ventricle has no regional wall motion abnormalities. There is mild left ventricular hypertrophy. Left ventricular diastolic parameters were normal.  2. Right ventricular systolic function is mildly reduced. The right ventricular size is mildly enlarged.  3. The mitral valve is normal  in structure. No evidence of mitral valve regurgitation. No evidence of mitral stenosis.  4. The aortic valve was not well visualized. Aortic valve regurgitation is not visualized. No aortic stenosis is present.  5. Aortic dilatation noted. There is mild dilatation of the ascending aorta, measuring 39 mm.  6. The inferior vena cava is normal in size with greater than 50% respiratory variability, suggesting right atrial pressure of 3 mmHg. FINDINGS  Left Ventricle: Possible small LVOT gradient due to hyperdynamic LV function not well characterized on doppler. Left ventricular ejection fraction, by estimation, is 65 to 70%. The left ventricle has normal function. The left ventricle has no regional wall motion abnormalities. Strain was performed and the global longitudinal strain is indeterminate. The left ventricular internal cavity size was normal in size. There is mild left ventricular hypertrophy. Left ventricular diastolic parameters were normal. Right Ventricle: The right ventricular size is mildly enlarged. No increase in right ventricular wall thickness. Right ventricular systolic function is mildly reduced. Left Atrium: Left atrial size was normal in size. Right Atrium: Right atrial size was normal in size. Pericardium: There is no evidence of  pericardial effusion. Mitral Valve: The mitral valve is normal in structure. No evidence of mitral valve regurgitation. No evidence of mitral valve stenosis. Tricuspid Valve: The tricuspid valve is normal in structure. Tricuspid valve regurgitation is not demonstrated. No evidence of tricuspid stenosis. Aortic Valve: The aortic valve was not well visualized. Aortic valve regurgitation is not visualized. No aortic stenosis is present. Pulmonic Valve: The pulmonic valve was normal in structure. Pulmonic valve regurgitation is not visualized. No evidence of pulmonic stenosis. Aorta: Aortic dilatation noted. There is mild dilatation of the ascending aorta, measuring 39 mm. Venous: The inferior vena cava is normal in size with greater than 50% respiratory variability, suggesting right atrial pressure of 3 mmHg. IAS/Shunts: No atrial level shunt detected by color flow Doppler. Additional Comments: 3D was performed not requiring image post processing on an independent workstation and was indeterminate.  LEFT VENTRICLE PLAX 2D LVIDd:         3.40 cm   Diastology LVIDs:         1.90 cm   LV e' medial:    10.00 cm/s LV PW:         1.10 cm   LV E/e' medial:  7.2 LV IVS:        1.30 cm   LV e' lateral:   12.00 cm/s LVOT diam:     2.40 cm   LV E/e' lateral: 6.0 LVOT Area:     4.52 cm  RIGHT VENTRICLE RV Basal diam:  2.90 cm RV Mid diam:    2.50 cm RV S prime:     23.30 cm/s TAPSE (M-mode): 2.6 cm LEFT ATRIUM             Index        RIGHT ATRIUM           Index LA diam:        2.40 cm 1.13 cm/m   RA Area:     16.50 cm LA Vol (A2C):   50.7 ml 23.97 ml/m  RA Volume:   40.00 ml  18.91 ml/m LA Vol (A4C):   38.9 ml 18.39 ml/m LA Biplane Vol: 45.3 ml 21.42 ml/m   AORTA Ao Root diam: 3.40 cm Ao Asc diam:  3.80 cm MITRAL VALVE MV Area (PHT): 4.07 cm    SHUNTS MV E velocity: 72.00 cm/s  Systemic Diam: 2.40  cm MV A velocity: 86.10 cm/s MV E/A ratio:  0.84 Maude Emmer MD Electronically signed by Maude Emmer MD Signature  Date/Time: 07/27/2024/9:48:56 AM    Final    IR THROMBECT PRIM MECH INIT (INCLU) MOD SED Result Date: 07/27/2024 INDICATION: [age]-year-old male with history of acute, high risk pulmonary embolism. EXAM: 1. Ultrasound-guided vascular access of the right common femoral vein. 2. Bilateral pulmonary angiography. 3. Pulmonary manometry. 4. Aspiration thrombectomy of the bilateral pulmonary arteries. COMPARISON:  CTA chest from earlier the same day MEDICATIONS: 9,000 units heparin , intravenous ANESTHESIA/SEDATION: Moderate (conscious) sedation was employed during this procedure. A total of Versed  2 mg and Fentanyl  100 mcg was administered intravenously. Moderate Sedation Time: 59 minutes. The patient's level of consciousness and vital signs were monitored continuously by radiology nursing throughout the procedure under my direct supervision. FLUOROSCOPY TIME:  Eighty-three mGy reference air kerma COMPLICATIONS: None immediate. TECHNIQUE: Informed written consent was obtained from the patient after a thorough discussion of the procedural risks, benefits and alternatives. All questions were addressed. Maximal Sterile Barrier Technique was utilized including caps, mask, sterile gowns, sterile gloves, sterile drape, hand hygiene and skin antiseptic. A timeout was performed prior to the initiation of the procedure. Preprocedure ultrasound evaluation demonstrated patency of the right common femoral vein. The procedure was planned. Subdermal Local anesthesia was administered 1% lidocaine . A small skin nick was made. Under direct ultrasound visualization, the right common femoral vein was accessed with a 21 gauge micropuncture needle. A permanent ultrasound image was captured and stored in the record. Micropuncture sheath was inserted followed by placement of a Wholey wire was directed to the inferior vena cava under fluoroscopic guidance. Serial dilation was performed with an 8 French vascular sheath followed by  placement of 2 ProGlides in a pre close fashion at the 10 o'clock and 2 o'clock positions. Over the wire, a 24 French Inari sheath was then placed and directed to the inferior vena cava. Over the wire, a double angle pigtail catheter was inserted and under fluoroscopic guidance directed through the right atrium and right ventricle to the main pulmonary artery. The [facility] wire was then advanced to the right inferior lobar pulmonary artery. The pigtail catheter was exchanged for a 5 French vert catheter which was directed into the right inferior lobar pulmonary artery. The wire was exchanged for a short taper superstiff Amplatz wire. The catheter was removed. A 24 French Flowtreiver aspiration catheter was then directed under fluoroscopic guidance to the main pulmonary artery. The inner dilator was removed. Pulmonary manometry was performed which was significant for a mean pulmonary artery pressure of 36 mm Hg. The aspiration catheter was then advanced to the right pulmonary artery. Multiple aspirations were performed which yielded a large volume of chronic appearing thrombus. Right pulmonary angiogram was then repeated which demonstrated significantly improved patency and perfusion of the right lung. The system was then directed to the left pulmonary artery in the wire was inserted into the left inferior lobar pulmonary artery. Aspiration thrombectomy was then performed which yielded large volume chronic appearing thrombus. Repeat pulmonary manometry was then performed which demonstrated reduction in mean pulmonary artery pressure to 25 mm Hg. At this point, the patient's tachycardia improved and oxygen saturations improved. Left pulmonary angiogram demonstrated significantly improved perfusion throughout the left lung. The catheters were removed. The sheath was then removed and the ProGlides were tied and cut. IMPRESSION: 1. Successful bilateral pulmonary artery thrombectomy with removal of large volume chronic  appearing thrombus. 2. Pulmonary manometry demonstrated reduction  in mean pulmonary artery pressure from 36 mm Hg to 25 mm Hg. Ester Sides, MD [facility] Radiology Electronically Signed   By: Ester Sides M.D.   On: 07/27/2024 08:19   IR Angiogram Pulmonary Bilateral Selective Result Date: 07/27/2024 INDICATION: [age]-year-old male with history of acute, high risk pulmonary embolism. EXAM: 1. Ultrasound-guided vascular access of the right common femoral vein. 2. Bilateral pulmonary angiography. 3. Pulmonary manometry. 4. Aspiration thrombectomy of the bilateral pulmonary arteries. COMPARISON:  CTA chest from earlier the same day MEDICATIONS: 9,000 units heparin , intravenous ANESTHESIA/SEDATION: Moderate (conscious) sedation was employed during this procedure. A total of Versed  2 mg and Fentanyl  100 mcg was administered intravenously. Moderate Sedation Time: 59 minutes. The patient's level of consciousness and vital signs were monitored continuously by radiology nursing throughout the procedure under my direct supervision. FLUOROSCOPY TIME:  Eighty-three mGy reference air kerma COMPLICATIONS: None immediate. TECHNIQUE: Informed written consent was obtained from the patient after a thorough discussion of the procedural risks, benefits and alternatives. All questions were addressed. Maximal Sterile Barrier Technique was utilized including caps, mask, sterile gowns, sterile gloves, sterile drape, hand hygiene and skin antiseptic. A timeout was performed prior to the initiation of the procedure. Preprocedure ultrasound evaluation demonstrated patency of the right common femoral vein. The procedure was planned. Subdermal Local anesthesia was administered 1% lidocaine . A small skin nick was made. Under direct ultrasound visualization, the right common femoral vein was accessed with a 21 gauge micropuncture needle. A permanent ultrasound image was captured and  stored in the record. Micropuncture sheath was inserted followed by placement of a Wholey wire was directed to the inferior vena cava under fluoroscopic guidance. Serial dilation was performed with an 8 French vascular sheath followed by placement of 2 ProGlides in a pre close fashion at the 10 o'clock and 2 o'clock positions. Over the wire, a 24 French Inari sheath was then placed and directed to the inferior vena cava. Over the wire, a double angle pigtail catheter was inserted and under fluoroscopic guidance directed through the right atrium and right ventricle to the main pulmonary artery. The [facility] wire was then advanced to the right inferior lobar pulmonary artery. The pigtail catheter was exchanged for a 5 French vert catheter which was directed into the right inferior lobar pulmonary artery. The wire was exchanged for a short taper superstiff Amplatz wire. The catheter was removed. A 24 French Flowtreiver aspiration catheter was then directed under fluoroscopic guidance to the main pulmonary artery. The inner dilator was removed. Pulmonary manometry was performed which was significant for a mean pulmonary artery pressure of 36 mm Hg. The aspiration catheter was then advanced to the right pulmonary artery. Multiple aspirations were performed which yielded a large volume of chronic appearing thrombus. Right pulmonary angiogram was then repeated which demonstrated significantly improved patency and perfusion of the right lung. The system was then directed to the left pulmonary artery in the wire was inserted into the left inferior lobar pulmonary artery. Aspiration thrombectomy was then performed which yielded large volume chronic appearing thrombus. Repeat pulmonary manometry was then performed which demonstrated reduction in mean pulmonary artery pressure to 25 mm Hg. At this point, the patient's tachycardia improved and oxygen saturations improved. Left pulmonary angiogram demonstrated significantly  improved perfusion throughout the left lung. The catheters were removed. The sheath was then removed and the ProGlides were tied and cut. IMPRESSION: 1. Successful bilateral pulmonary artery thrombectomy with removal of large volume chronic appearing thrombus. 2.  Pulmonary manometry demonstrated reduction in mean pulmonary artery pressure from 36 mm Hg to 25 mm Hg. Ester Sides, MD [facility] Radiology Electronically Signed   By: Ester Sides M.D.   On: 07/27/2024 08:19   IR Angiogram Selective Each Additional Vessel Result Date: 07/27/2024 INDICATION: [age]-year-old male with history of acute, high risk pulmonary embolism. EXAM: 1. Ultrasound-guided vascular access of the right common femoral vein. 2. Bilateral pulmonary angiography. 3. Pulmonary manometry. 4. Aspiration thrombectomy of the bilateral pulmonary arteries. COMPARISON:  CTA chest from earlier the same day MEDICATIONS: 9,000 units heparin , intravenous ANESTHESIA/SEDATION: Moderate (conscious) sedation was employed during this procedure. A total of Versed  2 mg and Fentanyl  100 mcg was administered intravenously. Moderate Sedation Time: 59 minutes. The patient's level of consciousness and vital signs were monitored continuously by radiology nursing throughout the procedure under my direct supervision. FLUOROSCOPY TIME:  Eighty-three mGy reference air kerma COMPLICATIONS: None immediate. TECHNIQUE: Informed written consent was obtained from the patient after a thorough discussion of the procedural risks, benefits and alternatives. All questions were addressed. Maximal Sterile Barrier Technique was utilized including caps, mask, sterile gowns, sterile gloves, sterile drape, hand hygiene and skin antiseptic. A timeout was performed prior to the initiation of the procedure. Preprocedure ultrasound evaluation demonstrated patency of the right common femoral vein. The procedure was planned. Subdermal  Local anesthesia was administered 1% lidocaine . A small skin nick was made. Under direct ultrasound visualization, the right common femoral vein was accessed with a 21 gauge micropuncture needle. A permanent ultrasound image was captured and stored in the record. Micropuncture sheath was inserted followed by placement of a Wholey wire was directed to the inferior vena cava under fluoroscopic guidance. Serial dilation was performed with an 8 French vascular sheath followed by placement of 2 ProGlides in a pre close fashion at the 10 o'clock and 2 o'clock positions. Over the wire, a 24 French Inari sheath was then placed and directed to the inferior vena cava. Over the wire, a double angle pigtail catheter was inserted and under fluoroscopic guidance directed through the right atrium and right ventricle to the main pulmonary artery. The [facility] wire was then advanced to the right inferior lobar pulmonary artery. The pigtail catheter was exchanged for a 5 French vert catheter which was directed into the right inferior lobar pulmonary artery. The wire was exchanged for a short taper superstiff Amplatz wire. The catheter was removed. A 24 French Flowtreiver aspiration catheter was then directed under fluoroscopic guidance to the main pulmonary artery. The inner dilator was removed. Pulmonary manometry was performed which was significant for a mean pulmonary artery pressure of 36 mm Hg. The aspiration catheter was then advanced to the right pulmonary artery. Multiple aspirations were performed which yielded a large volume of chronic appearing thrombus. Right pulmonary angiogram was then repeated which demonstrated significantly improved patency and perfusion of the right lung. The system was then directed to the left pulmonary artery in the wire was inserted into the left inferior lobar pulmonary artery. Aspiration thrombectomy was then performed which yielded large volume chronic appearing thrombus. Repeat pulmonary  manometry was then performed which demonstrated reduction in mean pulmonary artery pressure to 25 mm Hg. At this point, the patient's tachycardia improved and oxygen saturations improved. Left pulmonary angiogram demonstrated significantly improved perfusion throughout the left lung. The catheters were removed. The sheath was then removed and the ProGlides were tied and cut. IMPRESSION: 1. Successful bilateral pulmonary artery thrombectomy with removal of large  volume chronic appearing thrombus. 2. Pulmonary manometry demonstrated reduction in mean pulmonary artery pressure from 36 mm Hg to 25 mm Hg. Ester Sides, MD [facility] Radiology Electronically Signed   By: Ester Sides M.D.   On: 07/27/2024 08:19   IR Angiogram Selective Each Additional Vessel Result Date: 07/27/2024 INDICATION: [age]-year-old male with history of acute, high risk pulmonary embolism. EXAM: 1. Ultrasound-guided vascular access of the right common femoral vein. 2. Bilateral pulmonary angiography. 3. Pulmonary manometry. 4. Aspiration thrombectomy of the bilateral pulmonary arteries. COMPARISON:  CTA chest from earlier the same day MEDICATIONS: 9,000 units heparin , intravenous ANESTHESIA/SEDATION: Moderate (conscious) sedation was employed during this procedure. A total of Versed  2 mg and Fentanyl  100 mcg was administered intravenously. Moderate Sedation Time: 59 minutes. The patient's level of consciousness and vital signs were monitored continuously by radiology nursing throughout the procedure under my direct supervision. FLUOROSCOPY TIME:  Eighty-three mGy reference air kerma COMPLICATIONS: None immediate. TECHNIQUE: Informed written consent was obtained from the patient after a thorough discussion of the procedural risks, benefits and alternatives. All questions were addressed. Maximal Sterile Barrier Technique was utilized including caps, mask, sterile gowns, sterile gloves,  sterile drape, hand hygiene and skin antiseptic. A timeout was performed prior to the initiation of the procedure. Preprocedure ultrasound evaluation demonstrated patency of the right common femoral vein. The procedure was planned. Subdermal Local anesthesia was administered 1% lidocaine . A small skin nick was made. Under direct ultrasound visualization, the right common femoral vein was accessed with a 21 gauge micropuncture needle. A permanent ultrasound image was captured and stored in the record. Micropuncture sheath was inserted followed by placement of a Wholey wire was directed to the inferior vena cava under fluoroscopic guidance. Serial dilation was performed with an 8 French vascular sheath followed by placement of 2 ProGlides in a pre close fashion at the 10 o'clock and 2 o'clock positions. Over the wire, a 24 French Inari sheath was then placed and directed to the inferior vena cava. Over the wire, a double angle pigtail catheter was inserted and under fluoroscopic guidance directed through the right atrium and right ventricle to the main pulmonary artery. The [facility] wire was then advanced to the right inferior lobar pulmonary artery. The pigtail catheter was exchanged for a 5 French vert catheter which was directed into the right inferior lobar pulmonary artery. The wire was exchanged for a short taper superstiff Amplatz wire. The catheter was removed. A 24 French Flowtreiver aspiration catheter was then directed under fluoroscopic guidance to the main pulmonary artery. The inner dilator was removed. Pulmonary manometry was performed which was significant for a mean pulmonary artery pressure of 36 mm Hg. The aspiration catheter was then advanced to the right pulmonary artery. Multiple aspirations were performed which yielded a large volume of chronic appearing thrombus. Right pulmonary angiogram was then repeated which demonstrated significantly improved patency and perfusion of the right lung. The  system was then directed to the left pulmonary artery in the wire was inserted into the left inferior lobar pulmonary artery. Aspiration thrombectomy was then performed which yielded large volume chronic appearing thrombus. Repeat pulmonary manometry was then performed which demonstrated reduction in mean pulmonary artery pressure to 25 mm Hg. At this point, the patient's tachycardia improved and oxygen saturations improved. Left pulmonary angiogram demonstrated significantly improved perfusion throughout the left lung. The catheters were removed. The sheath was then removed and the ProGlides were tied and cut. IMPRESSION: 1. Successful bilateral pulmonary artery  thrombectomy with removal of large volume chronic appearing thrombus. 2. Pulmonary manometry demonstrated reduction in mean pulmonary artery pressure from 36 mm Hg to 25 mm Hg. Ester Sides, MD [facility] Radiology Electronically Signed   By: Ester Sides M.D.   On: 07/27/2024 08:19   IR US  Guide Vasc Access Right Result Date: 07/27/2024 INDICATION: [age]-year-old male with history of acute, high risk pulmonary embolism. EXAM: 1. Ultrasound-guided vascular access of the right common femoral vein. 2. Bilateral pulmonary angiography. 3. Pulmonary manometry. 4. Aspiration thrombectomy of the bilateral pulmonary arteries. COMPARISON:  CTA chest from earlier the same day MEDICATIONS: 9,000 units heparin , intravenous ANESTHESIA/SEDATION: Moderate (conscious) sedation was employed during this procedure. A total of Versed  2 mg and Fentanyl  100 mcg was administered intravenously. Moderate Sedation Time: 59 minutes. The patient's level of consciousness and vital signs were monitored continuously by radiology nursing throughout the procedure under my direct supervision. FLUOROSCOPY TIME:  Eighty-three mGy reference air kerma COMPLICATIONS: None immediate. TECHNIQUE: Informed written consent was obtained from  the patient after a thorough discussion of the procedural risks, benefits and alternatives. All questions were addressed. Maximal Sterile Barrier Technique was utilized including caps, mask, sterile gowns, sterile gloves, sterile drape, hand hygiene and skin antiseptic. A timeout was performed prior to the initiation of the procedure. Preprocedure ultrasound evaluation demonstrated patency of the right common femoral vein. The procedure was planned. Subdermal Local anesthesia was administered 1% lidocaine . A small skin nick was made. Under direct ultrasound visualization, the right common femoral vein was accessed with a 21 gauge micropuncture needle. A permanent ultrasound image was captured and stored in the record. Micropuncture sheath was inserted followed by placement of a Wholey wire was directed to the inferior vena cava under fluoroscopic guidance. Serial dilation was performed with an 8 French vascular sheath followed by placement of 2 ProGlides in a pre close fashion at the 10 o'clock and 2 o'clock positions. Over the wire, a 24 French Inari sheath was then placed and directed to the inferior vena cava. Over the wire, a double angle pigtail catheter was inserted and under fluoroscopic guidance directed through the right atrium and right ventricle to the main pulmonary artery. The [facility] wire was then advanced to the right inferior lobar pulmonary artery. The pigtail catheter was exchanged for a 5 French vert catheter which was directed into the right inferior lobar pulmonary artery. The wire was exchanged for a short taper superstiff Amplatz wire. The catheter was removed. A 24 French Flowtreiver aspiration catheter was then directed under fluoroscopic guidance to the main pulmonary artery. The inner dilator was removed. Pulmonary manometry was performed which was significant for a mean pulmonary artery pressure of 36 mm Hg. The aspiration catheter was then advanced to the right pulmonary artery. Multiple  aspirations were performed which yielded a large volume of chronic appearing thrombus. Right pulmonary angiogram was then repeated which demonstrated significantly improved patency and perfusion of the right lung. The system was then directed to the left pulmonary artery in the wire was inserted into the left inferior lobar pulmonary artery. Aspiration thrombectomy was then performed which yielded large volume chronic appearing thrombus. Repeat pulmonary manometry was then performed which demonstrated reduction in mean pulmonary artery pressure to 25 mm Hg. At this point, the patient's tachycardia improved and oxygen saturations improved. Left pulmonary angiogram demonstrated significantly improved perfusion throughout the left lung. The catheters were removed. The sheath was then removed and the ProGlides were tied and cut. IMPRESSION:  1. Successful bilateral pulmonary artery thrombectomy with removal of large volume chronic appearing thrombus. 2. Pulmonary manometry demonstrated reduction in mean pulmonary artery pressure from 36 mm Hg to 25 mm Hg. Ester Sides, MD [facility] Radiology Electronically Signed   By: Ester Sides M.D.   On: 07/27/2024 08:19   US  EKG SITE RITE Result Date: 07/27/2024 If Site Rite image not attached, placement could not be confirmed due to current cardiac rhythm.  CT Angio Chest Pulmonary Embolism (PE) W or WO Contrast Result Date: 07/26/2024 EXAM: CTA of the Chest with contrast for PE 07/26/2024 05:27:37 PM TECHNIQUE: CTA of the chest was performed after the administration of 75 mL of iohexol  (OMNIPAQUE ) 350 MG/ML injection. Multiplanar reformatted images are provided for review. MIP images are provided for review. Automated exposure control, iterative reconstruction, and/or weight based adjustment of the mA/kV was utilized to reduce the radiation dose to as low as reasonably achievable. COMPARISON: None available. CLINICAL  HISTORY: acute hypoxic respiratory failure FINDINGS: PULMONARY ARTERIES: Pulmonary arteries are adequately opacified for evaluation. Large filling defects are seen involving the distal portions of both pulmonary arteries which extend into upper and lower lobe branches bilaterally consistent with large pulmonary emboli. Main pulmonary artery is normal in caliber. MEDIASTINUM: The heart demonstrates an RV/LV ratio of approximately 2.0, suggesting right heart strain. The pericardium demonstrates no acute abnormality. There is no acute abnormality of the thoracic aorta. LYMPH NODES: No mediastinal, hilar or axillary lymphadenopathy. LUNGS AND PLEURA: Left lower lobe airspace opacities are noted, concerning for pneumonia or atelectasis. Minimal right lower lobe subsegmental atelectasis is noted. Small left pleural effusion is noted. No pneumothorax. UPPER ABDOMEN: Limited images of the upper abdomen are unremarkable. SOFT TISSUES AND BONES: No acute bone or soft tissue abnormality. IMPRESSION: 1. Large bilateral pulmonary emboli involving the bilateral pulmonary arteries with lobar branch extension bilaterally, with right heart strain (RV/LV ratio approximately 2.0). Consistent with at least submassive (intermediate risk) PE. The presence of right heart strain has been associated with an increased risk of morbidity and mortality. This finding was discussed with Bobie, nurse practitioner, at 5:40 pm on 07/26/2024. 2. Left lower lobe airspace disease, which may reflect pneumonia or atelectasis. 3. Small left pleural effusion. Electronically signed by: Lynwood Seip MD 07/26/2024 05:43 PM EST RP Workstation: [id number]   DG Chest Port 1 View Result Date: 07/26/2024 CLINICAL DATA:  Hypoxia. EXAM: PORTABLE CHEST 1 VIEW COMPARISON:  Radiograph dated 07/21/2024. FINDINGS: Minimal left lung base atelectasis. Trace left pleural effusion suspected. The right lung scan. No pneumothorax. Stable cardiac silhouette. No acute osseous  pathology. IMPRESSION: Trace left pleural effusion and minimal left lung base atelectasis. Electronically Signed   By: Vanetta Chou M.D.   On: 07/26/2024 17:30   CT HEAD WO CONTRAST ( ) Result Date: 07/23/2024 EXAM: CT HEAD WITHOUT CONTRAST 07/23/2024 04:20:51 PM TECHNIQUE: CT of the head was performed without the administration of intravenous contrast. Automated exposure control, iterative reconstruction, and/or weight based adjustment of the mA/kV was utilized to reduce the radiation dose to as low as reasonably achievable. COMPARISON: 07/20/2024 CLINICAL HISTORY: Encephalopathy, altered mental status. Was treated for DKA. He does have hypernatremia being treated. FINDINGS: BRAIN AND VENTRICLES: No acute hemorrhage. No evidence of acute infarct. No hydrocephalus. No extra-axial collection. No mass effect or midline shift. ORBITS: No acute abnormality. SINUSES: Moderate right maxillary sinus mucosal thickening. Bilateral maxillary antrostomies again noted. Secretions in nasopharynx. SOFT TISSUES AND SKULL: No acute soft tissue abnormality. No skull  fracture. IMPRESSION: 1. No acute intracranial abnormality. 2. Moderate right maxillary sinus mucosal thickening with secretions in the nasopharynx. Bilateral maxillary antrostomies are again present. Electronically signed by: Lonni Necessary MD 07/23/2024 05:18 PM EST RP Workstation: [id number]   ECHOCARDIOGRAM COMPLETE Result Date: 07/23/2024    ECHOCARDIOGRAM REPORT   Patient Name:   Jorge Calhoun [facility] Date of Exam: 07/23/2024 Medical Rec #:  [id number]         Height:       71.0 in Accession #:    [id number]        Weight:       229.7 lb Date of Birth:  30-Sep-1965         BSA:          2.236 m Patient Age:    58 years          BP:           102/65 mmHg Patient Gender: M                 HR:           110 bpm. Exam Location:  Inpatient Procedure: 2D Echo, Cardiac Doppler and Color Doppler (Both Spectral and Color            Flow Doppler were utilized  during procedure). Indications:    Bacteremia R78.81  History:        Patient has no prior history of Echocardiogram examinations.                 Risk Factors:Hypertension and Diabetes.  Sonographer:    Jayson Gaskins Referring Phys: [id number] ADEWALE A OLALERE IMPRESSIONS  1. NO obvious vegetations though cannot completely exclude. Recommend TEE if clinically indicated.  2. Left ventricular ejection fraction, by estimation, is 70 to 75%. The left ventricle has hyperdynamic function. The left ventricle has no regional wall motion abnormalities. There is mild left ventricular hypertrophy. Left ventricular diastolic parameters were normal.  3. Right ventricular systolic function is normal. The right ventricular size is normal.  4. Trivial mitral valve regurgitation.  5. The aortic valve is tricuspid. Aortic valve regurgitation is not visualized. FINDINGS  Left Ventricle: Left ventricular ejection fraction, by estimation, is 70 to 75%. The left ventricle has hyperdynamic function. The left ventricle has no regional wall motion abnormalities. The left ventricular internal cavity size was normal in size. There is mild left ventricular hypertrophy. Left ventricular diastolic parameters were normal. Right Ventricle: The right ventricular size is normal. Right vetricular wall thickness was not assessed. Right ventricular systolic function is normal. Left Atrium: Left atrial size was normal in size. Right Atrium: Right atrial size was normal in size. Pericardium: There is no evidence of pericardial effusion. Mitral Valve: There is mild thickening of the mitral valve leaflet(s). Trivial mitral valve regurgitation. Tricuspid Valve: The tricuspid valve is normal in structure. Tricuspid valve regurgitation is trivial. Aortic Valve: The aortic valve is tricuspid. Aortic valve regurgitation is not visualized. Aortic valve mean gradient measures 4.0 mmHg. Aortic valve peak gradient measures 5.9 mmHg. Aortic valve area, by VTI measures  2.80 cm. Pulmonic Valve: The pulmonic valve was not well visualized. Pulmonic valve regurgitation is not visualized. Aorta: The aortic root is normal in size and structure. Venous: The inferior vena cava was not well visualized. IAS/Shunts: The interatrial septum was not assessed.  LEFT VENTRICLE PLAX 2D LVIDd:         4.70 cm   Diastology LVIDs:  2.70 cm   LV e' medial:    9.03 cm/s LV PW:         0.90 cm   LV E/e' medial:  9.2 LV IVS:        1.20 cm   LV e' lateral:   12.80 cm/s LVOT diam:     2.00 cm   LV E/e' lateral: 6.5 LV SV:         55 LV SV Index:   25 LVOT Area:     3.14 cm  RIGHT VENTRICLE RV S prime:     17.40 cm/s TAPSE (M-mode): 2.4 cm LEFT ATRIUM             Index        RIGHT ATRIUM           Index LA Vol (A2C):   39.8 ml 17.80 ml/m  RA Area:     13.40 cm LA Vol (A4C):   25.4 ml 11.36 ml/m  RA Volume:   31.20 ml  13.95 ml/m LA Biplane Vol: 31.8 ml 14.22 ml/m  AORTIC VALVE AV Area (Vmax):    2.96 cm AV Area (Vmean):   2.52 cm AV Area (VTI):     2.80 cm AV Vmax:           121.00 cm/s AV Vmean:          93.200 cm/s AV VTI:            0.196 m AV Peak Grad:      5.9 mmHg AV Mean Grad:      4.0 mmHg LVOT Vmax:         114.00 cm/s LVOT Vmean:        74.800 cm/s LVOT VTI:          0.175 m LVOT/AV VTI ratio: 0.89  AORTA Ao Root diam: 3.20 cm MITRAL VALVE MV Area (PHT): 5.37 cm    SHUNTS MV E velocity: 83.10 cm/s  Systemic VTI:  0.18 m MV A velocity: 74.10 cm/s  Systemic Diam: 2.00 cm MV E/A ratio:  1.12 Vina Gull MD Electronically signed by Vina Gull MD Signature Date/Time: 07/23/2024/10:20:42 AM    Final    DG CHEST PORT 1 VIEW Result Date: 07/21/2024 EXAM: 1 VIEW(S) XRAY OF THE CHEST 07/21/2024 07:54:00 AM COMPARISON: 07/20/2024 CLINICAL HISTORY: Endotracheal tube placement. FINDINGS: LINES, TUBES AND DEVICES: Endotracheal tube in place with tip 4.1 cm above the carina. Enteric tube in place with tip and side port overlying the expected region of the gastric antrum. LUNGS AND PLEURA:  Low lung volumes. Left retrocardiac opacity. No pleural effusion. No pneumothorax. HEART AND MEDIASTINUM: No acute abnormality of the cardiac and mediastinal silhouettes. BONES AND SOFT TISSUES: No acute osseous abnormality. IMPRESSION: 1. Endotracheal tube and enteric tube in appropriate positions. 2. Left retrocardiac airspace opacity, which may reflect atelectasis given low lung volumes versus consolidation. Electronically signed by: Waddell Calk MD 07/21/2024 08:09 AM EST RP Workstation: [id number]   CT Cervical Spine Wo Contrast Result Date: 07/20/2024 EXAM: CT CERVICAL SPINE WITHOUT CONTRAST 07/20/2024 09:02:52 PM TECHNIQUE: CT of the cervical spine was performed without the administration of intravenous contrast. Multiplanar reformatted images are provided for review. Automated exposure control, iterative reconstruction, and/or weight based adjustment of the mA/kV was utilized to reduce the radiation dose to as low as reasonably achievable. COMPARISON: None available. 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 No acute fracture. DEGENERATIVE CHANGES: No severe degenerative changes. SOFT TISSUES: No prevertebral soft tissue swelling. IMPRESSION: 1. No acute abnormality of the cervical spine. Electronically signed by: Norman Gatlin MD 07/20/2024 09:11 PM EST RP Workstation: [id number]   CT Head Wo Contrast Result Date: 07/20/2024 EXAM: CT HEAD WITHOUT CONTRAST 07/20/2024 09:02:52 PM TECHNIQUE: CT of the head was performed without the administration of intravenous contrast. Automated exposure control, iterative reconstruction, and/or weight based adjustment of the mA/kV was utilized to reduce the radiation dose to as low as reasonably achievable. COMPARISON: None available. CLINICAL HISTORY: Delirium FINDINGS: BRAIN AND VENTRICLES: No acute hemorrhage. No evidence of  acute infarct. No hydrocephalus. No extra-axial collection. No mass effect or midline shift. ORBITS: No acute abnormality. SINUSES: Mucosal thickening in the ethmoid air cells. Near complete opacification of the right maxillary sinus with air-fluid level. SOFT TISSUES AND SKULL: No acute soft tissue abnormality. No skull fracture. IMPRESSION: 1. No acute intracranial abnormality. 2. Near complete opacification of the right maxillary sinus with air-fluid level. Correlate for sinusitis. Electronically signed by: Norman Gatlin MD 07/20/2024 09:09 PM EST RP Workstation: [id number]   DG Chest 1 View Result Date: 07/20/2024 CLINICAL DATA:  Altered mental status EXAM: CHEST  1 VIEW COMPARISON:  Chest x-ray 04/28/2018 FINDINGS: The heart size and mediastinal contours are within normal limits. Both lungs are clear. The visualized skeletal structures are unremarkable. IMPRESSION: No active disease. Electronically Signed   By: Greig Pique M.D.   On: 07/20/2024 20:23      Subjective: Patient seen and examined at bedside today.  Remains very comfortable.  Sitting in the chair.  No new complaints.  Medically stable for discharge to SNF today  Discharge Exam: Vitals:   08/14/24 0503 08/14/24 0947  BP: 98/71 117/87  Pulse: 77 91  Resp:    Temp: 98.1 F (36.7 C)   SpO2: 98%    Vitals:   08/13/24 2025 08/14/24 0500 08/14/24 0503 08/14/24 0947  BP: 121/88  98/71 117/87  Pulse: 84  77 91  Resp: 18     Temp: 98.6 F (37 C)  98.1 F (36.7 C)   TempSrc: Oral  Oral   SpO2: 100%  98%   Weight:  103.4 kg    Height:        General: Pt is alert, awake, not in acute distress Cardiovascular: RRR, S1/S2 +, no rubs, no gallops Respiratory: CTA bilaterally, no wheezing, no rhonchi Abdominal: Soft, NT, ND, bowel sounds + Extremities: no edema, no cyanosis    The results of significant diagnostics from this hospitalization (including imaging, microbiology, ancillary and laboratory) are listed below for  reference.     Microbiology: No results found for this or any previous visit (from the past 240 hours).   Labs: BNP (last 3 results) No results for input(s): BNP in the last 8760 hours. Basic Metabolic Panel: Recent Labs  Lab 08/13/24 0417 08/13/24 0457 08/14/24 0333  NA 142  --  144  K 3.0*  --  3.2*  CL 104  --  107  CO2 31  --  29  GLUCOSE 108*  --  84  BUN 5*  --  6  CREATININE 0.90  --  0.90  CALCIUM  8.7*  --  8.6*  MG  --  2.1  --    Liver Function Tests: No results for input(s): AST, ALT, ALKPHOS, BILITOT, PROT, ALBUMIN in the last 168 hours. No results for input(s): LIPASE, AMYLASE in the last 168  hours. No results for input(s): AMMONIA in the last 168 hours. CBC: Recent Labs  Lab 08/13/24 0417  WBC 3.9*  HGB 11.9*  HCT 35.7*  MCV 94.7  PLT 385   Cardiac Enzymes: No results for input(s): CKTOTAL, CKMB, CKMBINDEX, TROPONINI in the last 168 hours. BNP: Invalid input(s): POCBNP CBG: Recent Labs  Lab 08/13/24 1105 08/13/24 1643 08/13/24 2042 08/14/24 0801 08/14/24 1206  GLUCAP 156* 282* 167* 100* 164*   D-Dimer No results for input(s): DDIMER in the last 72 hours. Hgb A1c No results for input(s): HGBA1C in the last 72 hours. Lipid Profile No results for input(s): CHOL, HDL, LDLCALC, TRIG, CHOLHDL, LDLDIRECT in the last 72 hours. Thyroid  function studies No results for input(s): TSH, T4TOTAL, T3FREE, THYROIDAB in the last 72 hours.  Invalid input(s): FREET3 Anemia work up No results for input(s): VITAMINB12, FOLATE, FERRITIN, TIBC, IRON, RETICCTPCT in the last 72 hours. Urinalysis    Component Value Date/Time   COLORURINE YELLOW 07/20/2024 1930   APPEARANCEUR HAZY (A) 07/20/2024 1930   LABSPEC 1.018 07/20/2024 1930   PHURINE 5.0 07/20/2024 1930   GLUCOSEU >=500 (A) 07/20/2024 1930   HGBUR LARGE (A) 07/20/2024 1930   BILIRUBINUR NEGATIVE 07/20/2024 1930   KETONESUR 20 (A)  07/20/2024 1930   PROTEINUR 30 (A) 07/20/2024 1930   NITRITE NEGATIVE 07/20/2024 1930   LEUKOCYTESUR NEGATIVE 07/20/2024 1930   Sepsis Labs Recent Labs  Lab 08/13/24 0417  WBC 3.9*   Microbiology No results found for this or any previous visit (from the past 240 hours).  Please note: You were cared for by a hospitalist during your hospital stay. Once you are discharged, your primary care physician will handle any further medical issues. Please note that NO REFILLS for any discharge medications will be authorized once you are discharged, as it is imperative that you return to your primary care physician (or establish a relationship with a primary care physician if you do not have one) for your post hospital discharge needs so that they can reassess your need for medications and monitor your lab values.    Time coordinating discharge: 40 minutes  SIGNED:   Ivonne Mustache, MD  [facility] 08/14/2024, 12:38 PM Pager [phone]  If 7PM-7AM, please contact night-coverage [email or web address] Password [email or web address]    [1]  Allergies Allergen Reactions   Jardiance  [Empagliflozin ] Other (See Comments)    Precipitated DKA   Metformin And Related Nausea Only

## 2024-08-14 NOTE — TOC Transition Note (Addendum)
 Transition of Care [facility]) - Discharge Note   Patient Details  Name: Jorge Calhoun MRN: [id number] Date of Birth: 03/28/1966  [facility] CM/SW Contact:  Tawni CHRISTELLA Eva, LCSW Phone Number: 08/14/2024, 11:02 AM   Clinical Narrative:     CSW received a call from Jasmine at the [facility]. She stated that she will work on sending authorization information to the facility today. She reported that the facility should contact CSW once they receive auth form and the pts would be able to discharge today. ICM to follow.  Adden  12:00pm  CSW received a call from [place] with [employer]. She reported receiving all information from the [place]. The pt can discharge to the facility and is assigned to room 107. The RN is to call report to [phone]. The pts sister has been made aware and will provide transportation to the facility. No further ICM needs at this time; ICM to sign  Final next level of care: Skilled Nursing Facility Barriers to Discharge: Barriers Resolved   Patient Goals and CMS Choice Patient states their goals for this hospitalization and ongoing recovery are:: SNF to get stonger CMS Medicare.gov Compare Post Acute Care list provided to:: Patient Represenative (must comment) (Kathy(sister)) Choice offered to / list presented to : Sibling Sehili ownership interest in [facility].provided to:: Sibling    Discharge Placement                    Patient and family notified of of transfer: 08/14/24  Discharge Plan and Services Additional resources added to the After Visit Summary for     Discharge Planning Services: CM Consult Post Acute Care Choice: Skilled Nursing Facility                               Social Drivers of Health (SDOH) Interventions SDOH Screenings   Food Insecurity: No Food Insecurity (07/22/2024)  Housing: Low Risk (07/22/2024)  Transportation Needs: Patient Unable To Answer (07/22/2024)   Utilities: Patient Unable To Answer (07/22/2024)  Tobacco Use: Low Risk (07/20/2024)     Readmission Risk Interventions    07/24/2024    8:55 AM  Readmission Risk Prevention Plan  Post Dischage Appt Complete  Medication Screening Complete  Transportation Screening Complete

## 2024-08-14 NOTE — Progress Notes (Signed)
 Discharge instructions reviewed with patient and his sister, verbalized understanding. All questions answered. All belongings accounted for. Patient being discharged to [facility] in [place], [place]. Patient's sister is transporting via private vehicle to SNF.  PIV removed. Assisted via WC to private vehicle.

## 2024-08-14 NOTE — Progress Notes (Addendum)
 Attempted to call report to [facility], no answer. Will try again at a later time.

## 2024-08-14 NOTE — Progress Notes (Signed)
 Report called and given to [facility]. Patient discharged to SNF via sister.

## 2024-08-16 LAB — AEROBIC BACTERIA, ID BY SEQ.

## 2024-08-16 LAB — ORGANISM ID BY SEQUENCING

## 2024-08-30 ENCOUNTER — Ambulatory Visit: Admitting: Cardiology

## 2024-09-15 ENCOUNTER — Ambulatory Visit: Payer: Self-pay | Admitting: Cardiology

## 2024-10-11 ENCOUNTER — Ambulatory Visit: Payer: Self-pay | Admitting: Emergency Medicine

## 2024-10-13 ENCOUNTER — Ambulatory Visit: Payer: Self-pay | Admitting: Emergency Medicine
